# Patient Record
Sex: Female | Born: 1995 | Race: White | Hispanic: No | Marital: Married | Smoking: Never smoker
Health system: Southern US, Community
[De-identification: ages and names within clinical notes are randomized; demographics above are authoritative.]

## PROBLEM LIST (undated history)

## (undated) DIAGNOSIS — K219 Gastro-esophageal reflux disease without esophagitis: Secondary | ICD-10-CM

## (undated) DIAGNOSIS — R519 Headache, unspecified: Secondary | ICD-10-CM

## (undated) DIAGNOSIS — E079 Disorder of thyroid, unspecified: Secondary | ICD-10-CM

## (undated) DIAGNOSIS — F32A Depression, unspecified: Secondary | ICD-10-CM

## (undated) DIAGNOSIS — O905 Postpartum thyroiditis: Secondary | ICD-10-CM

## (undated) DIAGNOSIS — R87629 Unspecified abnormal cytological findings in specimens from vagina: Secondary | ICD-10-CM

## (undated) DIAGNOSIS — Z3490 Encounter for supervision of normal pregnancy, unspecified, unspecified trimester: Secondary | ICD-10-CM

## (undated) DIAGNOSIS — F419 Anxiety disorder, unspecified: Secondary | ICD-10-CM

## (undated) HISTORY — DX: Disorder of thyroid, unspecified: E07.9

## (undated) HISTORY — DX: Headache, unspecified: R51.9

## (undated) HISTORY — DX: Gastro-esophageal reflux disease without esophagitis: K21.9

## (undated) HISTORY — DX: Postpartum thyroiditis: O90.5

## (undated) HISTORY — DX: Anxiety disorder, unspecified: F41.9

## (undated) HISTORY — PX: DENTAL SURGERY: SHX609

## (undated) HISTORY — DX: Unspecified abnormal cytological findings in specimens from vagina: R87.629

## (undated) HISTORY — DX: Depression, unspecified: F32.A

---
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
## 2018-01-22 ENCOUNTER — Encounter: Payer: Self-pay | Admitting: Advanced Practice Midwife

## 2018-01-22 ENCOUNTER — Ambulatory Visit (INDEPENDENT_AMBULATORY_CARE_PROVIDER_SITE_OTHER): Payer: BLUE CROSS/BLUE SHIELD | Admitting: Advanced Practice Midwife

## 2018-01-22 ENCOUNTER — Other Ambulatory Visit (HOSPITAL_COMMUNITY)
Admission: RE | Admit: 2018-01-22 | Discharge: 2018-01-22 | Disposition: A | Discharge status: Home / Self Care | Payer: BLUE CROSS/BLUE SHIELD | Source: Ambulatory Visit | Attending: Advanced Practice Midwife | Admitting: Advanced Practice Midwife

## 2018-01-22 VITALS — BP 104/58 | HR 91 | Ht 67.0 in | Wt 154.0 lb

## 2018-01-22 DIAGNOSIS — Z113 Encounter for screening for infections with a predominantly sexual mode of transmission: Secondary | ICD-10-CM

## 2018-01-22 DIAGNOSIS — Z3400 Encounter for supervision of normal first pregnancy, unspecified trimester: Secondary | ICD-10-CM

## 2018-01-22 DIAGNOSIS — Z3201 Encounter for pregnancy test, result positive: Secondary | ICD-10-CM | POA: Diagnosis not present

## 2018-01-22 DIAGNOSIS — N912 Amenorrhea, unspecified: Secondary | ICD-10-CM

## 2018-01-22 DIAGNOSIS — Z3A01 Less than 8 weeks gestation of pregnancy: Secondary | ICD-10-CM

## 2018-01-22 DIAGNOSIS — Z3401 Encounter for supervision of normal first pregnancy, first trimester: Secondary | ICD-10-CM

## 2018-01-22 LAB — POCT URINE PREGNANCY: Preg Test, Ur: POSITIVE — AB

## 2018-01-22 NOTE — Progress Notes (Signed)
NOB 

## 2018-01-22 NOTE — Progress Notes (Signed)
New Obstetric Patient H&P    Chief Complaint: "Desires prenatal care"   History of Present Illness: Patient is a 22 y.o. G1P0000 Not Hispanic or Latino female, presents with amenorrhea and positive home pregnancy test. Patient's last menstrual period was 11/28/2017 (exact date). and based on her  LMP, her EDD is Estimated Date of Delivery: 09/04/18 and her EGA is [id number]. Cycles are 6. days, irregular, and occur approximately every : 1-2 months. Her last pap smear was less than a year ago and was ASCUS with NEGATIVE high risk HPV.    She had a urine pregnancy test which was positive 1 week(s)  ago. Her last menstrual period was normal and lasted for  5 or 6 day(s). Since her LMP she claims she has experienced breast tenderness, fatigue, nausea, vomiting. She denies vaginal bleeding. Her past medical history is noncontributory. This is her first pregnancy.  Since her LMP, she admits to the use of tobacco products  no She claims she has lost 2 pounds since the start of her pregnancy.  There are cats in the home in the home  yes If yes Indoor She admits close contact with children on a regular basis  no She has had chicken pox in the past no She has had Tuberculosis exposures, symptoms, or previously tested positive for TB   no Current or past history of domestic violence. no  Genetic Screening/Teratology Counseling: (Includes patient, baby's father, or anyone in either family with:)   1. [facility] 9635 [facility]  no 2. Thalassemia ([place], [place], Mediterranean, or Asian background): MCV<80  no 3. Neural tube defect (meningomyelocele, spina bifida, anencephaly)  no 4. Congenital heart defect  no  5. Down syndrome  no 6. Tay-Sachs (Jewish, [place])  no 7. Canavan's Disease  no 8. Sickle cell disease or trait (African)  no  9. Hemophilia or other blood disorders  no  10. Muscular dystrophy  no  11. Cystic fibrosis  no  12. Huntington's Chorea  no  13. Mental retardation/autism   no 14. Other inherited genetic or chromosomal disorder  no 15. Maternal metabolic disorder (DM, PKU, etc)  no 16. Patient or FOB with a child with a birth defect not listed above no  16a. Patient or FOB with a birth defect themselves no 17. Recurrent pregnancy loss, or stillbirth  no  18. Any medications since LMP other than prenatal vitamins (include vitamins, supplements, OTC meds, drugs, alcohol)  no 19. Any other genetic/environmental exposure to discuss  no  Infection History:   1. Lives with someone with TB or TB exposed  no  2. Patient or partner has history of genital herpes  no 3. Rash or viral illness since LMP  no 4. History of STI (GC, CT, HPV, syphilis, HIV)  no 5. History of recent travel :  no  Other pertinent information:  no     Review of Systems:10 point review of systems negative unless otherwise noted in HPI  Past Medical History:  History reviewed. No pertinent past medical history.  Past Surgical History:  History reviewed. No pertinent surgical history.  Gynecologic History: Patient's last menstrual period was 11/28/2017 (exact date).  Obstetric History: G1P0000  Family History:  History reviewed. No pertinent family history.  Social History:  Social History   Socioeconomic History  . Marital status: Single    Spouse name: Not on file  . Number of children: Not on file  . Years of education: Not on file  .  Highest education level: Not on file  Occupational History  . Not on file  Social Needs  . Financial resource strain: Not on file  . Food insecurity:    Worry: Not on file    Inability: Not on file  . Transportation needs:    Medical: Not on file    Non-medical: Not on file  Tobacco Use  . Smoking status: Never Smoker  . Smokeless tobacco: Never Used  Substance and Sexual Activity  . Alcohol use: Yes    Comment: Occ  . Drug use: Never  . Sexual activity: Yes    Birth control/protection: None  Lifestyle  . Physical activity:     Days per week: Not on file    Minutes per session: Not on file  . Stress: Not on file  Relationships  . Social connections:    Talks on phone: Not on file    Gets together: Not on file    Attends religious service: Not on file    Active member of club or organization: Not on file    Attends meetings of clubs or organizations: Not on file    Relationship status: Not on file  . Intimate partner violence:    Fear of current or ex partner: Not on file    Emotionally abused: Not on file    Physically abused: Not on file    Forced sexual activity: Not on file  Other Topics Concern  . Not on file  Social History Narrative  . Not on file    Allergies:  Allergies  Allergen Reactions  . Nexium [Esomeprazole Magnesium] Nausea And Vomiting    Medications: Prior to Admission medications   Not on File    Physical Exam Vitals: Blood pressure (!) 104/58, pulse 91, height 5\' 4"  (1.626 m), weight 154 lb (69.9 kg), last menstrual period 11/28/2017.  General: NAD HEENT: normocephalic, anicteric Thyroid: no enlargement, no palpable nodules Pulmonary: No increased work of breathing, CTAB Cardiovascular: RRR, distal pulses 2+ Abdomen: NABS, soft, non-tender, non-distended.  Umbilicus without lesions.  No hepatomegaly, splenomegaly or masses palpable. No evidence of hernia  Genitourinary: deferred for recent PAP smear and no concerns Extremities: no edema, erythema, or tenderness Neurologic: Grossly intact Psychiatric: mood appropriate, affect full   Assessment: 22 y.o. G1P0000 at [redacted]w[redacted]d presenting to initiate prenatal care  Plan: 1) Avoid alcoholic beverages. 2) Patient encouraged not to smoke.  3) Discontinue the use of all non-medicinal drugs and chemicals.  4) Take prenatal vitamins daily.  5) Nutrition, food safety (fish, cheese advisories, and high nitrite foods) and exercise discussed. 6) Hospital and practice style discussed with cross coverage system.  7) Genetic Screening,  such as with 1st Trimester Screening, cell free fetal DNA, AFP testing, and Ultrasound, as well as with amniocentesis and CVS as appropriate, is discussed with patient. At the conclusion of today's visit patient requested genetic testing 8) Patient is asked about travel to areas at risk for the Zika virus, and counseled to avoid travel and exposure to mosquitoes or sexual partners who may have themselves been exposed to the virus. Testing is discussed, and will be ordered as appropriate.    Tresea Mall, CNM [facility], [facility] 01/22/2018, 10:58 AM

## 2018-01-22 NOTE — Patient Instructions (Signed)
Exercise During Pregnancy For people of all ages, exercise is an important part of being healthy. Exercise improves heart and lung function and helps to maintain strength, flexibility, and a healthy body weight. Exercise also boosts energy levels and elevates mood. For most women, maintaining an exercise routine throughout pregnancy is recommended. It is only on rare occasions and with certain medical conditions or pregnancy complications that women may be asked to limit or avoid exercise during pregnancy. What are some other benefits to exercising during pregnancy? Along with maintaining strength and flexibility, exercising throughout pregnancy can help to:  Keep strength in muscles that are very important during labor and childbirth.  Decrease low back pain during pregnancy.  Decrease the risk of developing gestational diabetes mellitus (GDM).  Improve blood sugar (glucose) control for women who have GDM.  Decrease the risk of developing preeclampsia. This is a serious condition that causes high blood pressure along with other symptoms, such as swelling and headaches.  Decrease the risk of cesarean delivery.  Speed up the recovery after giving birth.  How often should I exercise? Unless your health care provider gives you different instructions, you should try to exercise on most days or all days of the week. In general, try to exercise with moderate intensity for about 150 minutes per week. This can be spread out across several days, such as exercising for 30 minutes per day on 5 days of each week. You can tell that you are exercising at a moderate intensity if you have a higher heart rate and faster breathing, but you are still able to hold a conversation. What types of moderate-intensity exercise are recommended during pregnancy? There are many types of exercise that are safe for you to do during pregnancy. Unless your health care provider gives you different instructions, do a variety of  exercises that safely increase your heart and breathing (cardiopulmonary) rates and help you to build and maintain muscle strength (strength training). You should always be able to talk in full sentences while exercising during pregnancy. Some examples of exercising that is safe to do during pregnancy include:  Brisk walking or hiking.  Swimming.  Water aerobics.  Riding a stationary bike.  Strength training.  Modified yoga or Pilates. Tell your instructor that you are pregnant. Avoid overstretching and avoid lying on your back for long periods of time.  Running or jogging. Only choose this type of exercise if: ? You ran or jogged regularly before your pregnancy. ? You can run or jog and still talk in complete sentences.  What types of exercise should I not do during pregnancy? Depending on your level of fitness and whether you exercised regularly before your pregnancy, you may be advised to limit vigorous-intensity exercise during your pregnancy. You can tell that you are exercising at a vigorous intensity if you are breathing much harder and faster and cannot hold a conversation while exercising. Some examples of exercising that you should avoid during pregnancy include:  Contact sports.  Activities that place you at risk for falling on or being hit in the belly, such as downhill skiing, water skiing, surfing, rock climbing, cycling, gymnastics, and horseback riding.  Scuba diving.  Sky diving.  Yoga or Pilates in a room that is heated to extreme temperatures ("hot yoga" or "hot Pilates").  Jogging or running, unless you ran or jogged regularly before your pregnancy. While jogging or running, you should always be able to talk in full sentences. Do not run or jog so vigorously   that you are unable to have a conversation.  If you are not used to exercising at elevation (more than 6,000 feet above sea level), do not do so during your pregnancy.  When should I avoid exercising  during pregnancy? Certain medical conditions can make it unsafe to exercise during pregnancy, or they may increase your risk of miscarriage or early labor and birth. Some of these conditions include:  Some types of heart disease.  Some types of lung disease.  Placenta previa. This is when the placenta partially or completely covers the opening of the uterus (cervix).  Frequent bleeding from the vagina during your pregnancy.  Incompetent cervix. This is when your cervix does not remain as tightly closed during pregnancy as it should.  Premature labor.  Ruptured membranes. This is when the protective sac (amniotic sac) opens up and amniotic fluid leaks from your vagina.  Severely low blood count (anemia).  Preeclampsia or pregnancy-caused high blood pressure.  Carrying more than one baby (multiple gestation) and having an additional risk of early labor.  Poorly controlled diabetes.  Being severely underweight or severely overweight.  Intrauterine growth restriction. This is when your baby's growth and development during pregnancy are slower than expected.  Other medical conditions. Ask your health care provider if any apply to you.  What else should I know about exercising during pregnancy? You should take these precautions while exercising during pregnancy:  Avoid overheating. ? Wear loose-fitting, breathable clothes. ? Do not exercise in very high temperatures.  Avoid dehydration. Drink enough water before, during, and after exercise to keep your urine clear or pale yellow.  Avoid overstretching. Because of hormone changes during pregnancy, it is easy to overstretch muscles, tendons, and ligaments during pregnancy.  Start slowly and ask your health care provider to recommend types of exercise that are safe for you, if exercising regularly is new for you.  Pregnancy is not a time for exercising to lose weight. When should I seek medical care? You should stop exercising  and call your health care provider if you have any unusual symptoms, such as:  Mild uterine contractions or abdominal cramping.  Dizziness that does not improve with rest.  When should I seek immediate medical care? You should stop exercising and call your local emergency services (911 in the [place] if you have any unusual symptoms, such as:  Sudden, severe pain in your low back or your belly.  Uterine contractions or abdominal cramping that do not improve with rest.  Chest pain.  Bleeding or fluid leaking from your vagina.  Shortness of breath.  This information is not intended to replace advice given to you by your health care provider. Make sure you discuss any questions you have with your health care provider. Document Released: 07/11/2005 Document Revised: 12/09/2015 Document Reviewed: 09/18/2014 Elsevier Interactive Patient Education  2018 [employer] Eating Plan for Pregnant Women While you are pregnant, your body will require additional nutrition to help support your growing baby. It is recommended that you consume:  150 additional calories each day during your first trimester.  300 additional calories each day during your second trimester.  300 additional calories each day during your third trimester.  Eating a healthy, well-balanced diet is very important for your health and for your baby's health. You also have a higher need for some vitamins and minerals, such as folic acid, calcium, iron, and vitamin D. What do I need to know about eating during pregnancy?  Do not try to lose weight   or go on a diet during pregnancy.  Choose healthy, nutritious foods. Choose  of a sandwich with a glass of milk instead of a candy bar or a high-calorie sugar-sweetened beverage.  Limit your overall intake of foods that have "empty calories." These are foods that have little nutritional value, such as sweets, desserts, candies, sugar-sweetened beverages, and fried foods.  Eat a  variety of foods, especially fruits and vegetables.  Take a prenatal vitamin to help meet the additional needs during pregnancy, specifically for folic acid, iron, calcium, and vitamin D.  Remember to stay active. Ask your health care provider for exercise recommendations that are specific to you.  Practice good food safety and cleanliness, such as washing your hands before you eat and after you prepare raw meat. This helps to prevent foodborne illnesses, such as listeriosis, that can be very dangerous for your baby. Ask your health care provider for more information about listeriosis. What does 150 extra calories look like? Healthy options for an additional 150 calories each day could be any of the following:  Plain low-fat yogurt (6-8 oz) with  cup of berries.  1 apple with 2 teaspoons of peanut butter.  Cut-up vegetables with  cup of hummus.  Low-fat chocolate milk (8 oz or 1 cup).  1 string cheese with 1 medium orange.   of a peanut butter and jelly sandwich on whole-wheat bread (1 tsp of peanut butter).  For 300 calories, you could eat two of those healthy options each day. What is a healthy amount of weight to gain? The recommended amount of weight for you to gain is based on your pre-pregnancy BMI. If your pre-pregnancy BMI was:  Less than 18 (underweight), you should gain 28-40 lb.  18-24.9 (normal), you should gain 25-35 lb.  25-29.9 (overweight), you should gain 15-25 lb.  Greater than 30 (obese), you should gain 11-20 lb.  What if I am having twins or multiples? Generally, pregnant women who will be having twins or multiples may need to increase their daily calories by 300-600 calories each day. The recommended range for total weight gain is 25-54 lb, depending on your pre-pregnancy BMI. Talk with your health care provider for specific guidance about additional nutritional needs, weight gain, and exercise during your pregnancy. What foods can I eat? Grains Any  grains. Try to choose whole grains, such as whole-wheat bread, oatmeal, or brown rice. Vegetables Any vegetables. Try to eat a variety of colors and types of vegetables to get a full range of vitamins and minerals. Remember to wash your vegetables well before eating. Fruits Any fruits. Try to eat a variety of colors and types of fruit to get a full range of vitamins and minerals. Remember to wash your fruits well before eating. Meats and Other Protein Sources Lean meats, including chicken, turkey, fish, and lean cuts of beef, veal, or pork. Make sure that all meats are cooked to "well done." Tofu. Tempeh. Beans. Eggs. Peanut butter and other nut butters. Seafood, such as shrimp, crab, and lobster. If you choose fish, select types that are higher in omega-3 fatty acids, including salmon, herring, mussels, trout, sardines, and pollock. Make sure that all meats are cooked to food-safe temperatures. Dairy Pasteurized milk and milk alternatives. Pasteurized yogurt and pasteurized cheese. Cottage cheese. Sour cream. Beverages Water. Juices that contain 100% fruit juice or vegetable juice. Caffeine-free teas and decaffeinated coffee. Drinks that contain caffeine are okay to drink, but it is better to avoid caffeine. Keep your total caffeine   intake to less than 200 mg each day (12 oz of coffee, tea, or soda) or as directed by your health care provider. Condiments Any pasteurized condiments. Sweets and Desserts Any sweets and desserts. Fats and Oils Any fats and oils. The items listed above may not be a complete list of recommended foods or beverages. Contact your dietitian for more options. What foods are not recommended? Vegetables Unpasteurized (raw) vegetable juices. Fruits Unpasteurized (raw) fruit juices. Meats and Other Protein Sources Cured meats that have nitrates, such as bacon, salami, and hotdogs. Luncheon meats, bologna, or other deli meats (unless they are reheated until they are  steaming hot). Refrigerated pate, meat spreads from a meat counter, smoked seafood that is found in the refrigerated section of a store. Raw fish, such as sushi or sashimi. High mercury content fish, such as tilefish, shark, swordfish, and king mackerel. Raw meats, such as tuna or beef tartare. Undercooked meats and poultry. Make sure that all meats are cooked to food-safe temperatures. Dairy Unpasteurized (raw) milk and any foods that have raw milk in them. Soft cheeses, such as feta, queso blanco, queso fresco, Brie, Camembert cheeses, blue-veined cheeses, and Panela cheese (unless it is made with pasteurized milk, which must be stated on the label). Beverages Alcohol. Sugar-sweetened beverages, such as sodas, teas, or energy drinks. Condiments Homemade fermented foods and drinks, such as pickles, sauerkraut, or kombucha drinks. (Store-bought pasteurized versions of these are okay.) Other Salads that are made in the store, such as ham salad, chicken salad, egg salad, tuna salad, and seafood salad. The items listed above may not be a complete list of foods and beverages to avoid. Contact your dietitian for more information. This information is not intended to replace advice given to you by your health care provider. Make sure you discuss any questions you have with your health care provider. Document Released: 04/25/2014 Document Revised: 12/17/2015 Document Reviewed: 12/24/2013 Elsevier Interactive Patient Education  2018 [employer] Prenatal Care WHAT IS PRENATAL CARE? Prenatal care is the process of caring for a pregnant woman before she gives birth. Prenatal care makes sure that she and her baby remain as healthy as possible throughout pregnancy. Prenatal care may be provided by a midwife, family practice health care provider, or a childbirth and pregnancy specialist (obstetrician). Prenatal care may include physical examinations, testing, treatments, and education on nutrition, lifestyle, and  social support services. WHY IS PRENATAL CARE SO IMPORTANT? Early and consistent prenatal care increases the chance that you and your baby will remain healthy throughout your pregnancy. This type of care also decreases a baby's risk of being born too early (prematurely), or being born smaller than expected (small for gestational age). Any underlying medical conditions you may have that could pose a risk during your pregnancy are discussed during prenatal care visits. You will also be monitored regularly for any new conditions that may arise during your pregnancy so they can be treated quickly and effectively. WHAT HAPPENS DURING PRENATAL CARE VISITS? Prenatal care visits may include the following: Discussion Tell your health care provider about any new signs or symptoms you have experienced since your last visit. These might include:  Nausea or vomiting.  Increased or decreased level of energy.  Difficulty sleeping.  Back or leg pain.  Weight changes.  Frequent urination.  Shortness of breath with physical activity.  Changes in your skin, such as the development of a rash or itchiness.  Vaginal discharge or bleeding.  Feelings of excitement or nervousness.  Changes in   your baby's movements.  You may want to write down any questions or topics you want to discuss with your health care provider and bring them with you to your appointment. Examination During your first prenatal care visit, you will likely have a complete physical exam. Your health care provider will often examine your vagina, cervix, and the position of your uterus, as well as check your heart, lungs, and other body systems. As your pregnancy progresses, your health care provider will measure the size of your uterus and your baby's position inside your uterus. He or she may also examine you for early signs of labor. Your prenatal visits may also include checking your blood pressure and, after about 10-12 weeks of  pregnancy, listening to your baby's heartbeat. Testing Regular testing often includes:  Urinalysis. This checks your urine for glucose, protein, or signs of infection.  Blood count. This checks the levels of white and red blood cells in your body.  Tests for sexually transmitted infections (STIs). Testing for STIs at the beginning of pregnancy is routinely done and is required in many states.  Antibody testing. You will be checked to see if you are immune to certain illnesses, such as rubella, that can affect a developing fetus.  Glucose screen. Around 24-28 weeks of pregnancy, your blood glucose level will be checked for signs of gestational diabetes. Follow-up tests may be recommended.  Group B strep. This is a bacteria that is commonly found inside a woman's vagina. This test will inform your health care provider if you need an antibiotic to reduce the amount of this bacteria in your body prior to labor and childbirth.  Ultrasound. Many pregnant women undergo an ultrasound screening around 18-20 weeks of pregnancy to evaluate the health of the fetus and check for any developmental abnormalities.  HIV (human immunodeficiency virus) testing. Early in your pregnancy, you will be screened for HIV. If you are at high risk for HIV, this test may be repeated during your third trimester of pregnancy.  You may be offered other testing based on your age, personal or family medical history, or other factors. HOW OFTEN SHOULD I PLAN TO SEE MY HEALTH CARE PROVIDER FOR PRENATAL CARE? Your prenatal care check-up schedule depends on any medical conditions you have before, or develop during, your pregnancy. If you do not have any underlying medical conditions, you will likely be seen for checkups:  Monthly, during the first 6 months of pregnancy.  Twice a month during months 7 and 8 of pregnancy.  Weekly starting in the 9th month of pregnancy and until delivery.  If you develop signs of early labor  or other concerning signs or symptoms, you may need to see your health care provider more often. Ask your health care provider what prenatal care schedule is best for you. WHAT CAN I DO TO KEEP MYSELF AND MY BABY AS HEALTHY AS POSSIBLE DURING MY PREGNANCY?  Take a prenatal vitamin containing 400 micrograms (0.4 mg) of folic acid every day. Your health care provider may also ask you to take additional vitamins such as iodine, vitamin D, iron, copper, and zinc.  Take 1500-2000 mg of calcium daily starting at your 20th week of pregnancy until you deliver your baby.  Make sure you are up to date on your vaccinations. Unless directed otherwise by your health care provider: ? You should receive a tetanus, diphtheria, and pertussis (Tdap) vaccination between the 27th and 36th week of your pregnancy, regardless of when your last Tdap immunization   occurred. This helps protect your baby from whooping cough (pertussis) after he or she is born. ? You should receive an annual inactivated influenza vaccine (IIV) to help protect you and your baby from influenza. This can be done at any point during your pregnancy.  Eat a well-rounded diet that includes: ? Fresh fruits and vegetables. ? Lean proteins. ? Calcium-rich foods such as milk, yogurt, hard cheeses, and dark, leafy greens. ? Whole grain breads.  Do noteat seafood high in mercury, including: ? Swordfish. ? Tilefish. ? Shark. ? King mackerel. ? More than 6 oz tuna per week.  Do not eat: ? Raw or undercooked meats or eggs. ? Unpasteurized foods, such as soft cheeses (brie, blue, or feta), juices, and milks. ? Lunch meats. ? Hot dogs that have not been heated until they are steaming.  Drink enough water to keep your urine clear or pale yellow. For many women, this may be 10 or more 8 oz glasses of water each day. Keeping yourself hydrated helps deliver nutrients to your baby and may prevent the start of pre-term uterine contractions.  Do not use  any tobacco products including cigarettes, chewing tobacco, or electronic cigarettes. If you need help quitting, ask your health care provider.  Do not drink beverages containing alcohol. No safe level of alcohol consumption during pregnancy has been determined.  Do not use any illegal drugs. These can harm your developing baby or cause a miscarriage.  Ask your health care provider or pharmacist before taking any prescription or over-the-counter medicines, herbs, or supplements.  Limit your caffeine intake to no more than 200 mg per day.  Exercise. Unless told otherwise by your health care provider, try to get 30 minutes of moderate exercise most days of the week. Do not  do high-impact activities, contact sports, or activities with a high risk of falling, such as horseback riding or downhill skiing.  Get plenty of rest.  Avoid anything that raises your body temperature, such as hot tubs and saunas.  If you own a cat, do not empty its litter box. Bacteria contained in cat feces can cause an infection called toxoplasmosis. This can result in serious harm to the fetus.  Stay away from chemicals such as insecticides, lead, mercury, and cleaning or paint products that contain solvents.  Do not have any X-rays taken unless medically necessary.  Take a childbirth and breastfeeding preparation class. Ask your health care provider if you need a referral or recommendation.  This information is not intended to replace advice given to you by your health care provider. Make sure you discuss any questions you have with your health care provider. Document Released: 07/14/2003 Document Revised: 12/14/2015 Document Reviewed: 09/25/2013 Elsevier Interactive Patient Education  2017 [employer]  

## 2018-01-23 ENCOUNTER — Encounter (INDEPENDENT_AMBULATORY_CARE_PROVIDER_SITE_OTHER): Payer: Self-pay

## 2018-01-23 ENCOUNTER — Encounter: Payer: Self-pay | Admitting: Advanced Practice Midwife

## 2018-01-23 ENCOUNTER — Telehealth: Payer: Self-pay

## 2018-01-23 LAB — CERVICOVAGINAL ANCILLARY ONLY
Chlamydia: NEGATIVE
Neisseria Gonorrhea: NEGATIVE

## 2018-01-23 NOTE — Telephone Encounter (Signed)
Pt needs note faxed to her work saying she is on transitional duty? I called pt to get fax number no answer , Please advise about letter.

## 2018-01-23 NOTE — Telephone Encounter (Signed)
Fax is [phone]

## 2018-01-23 NOTE — Progress Notes (Signed)
Waiting to hear back from patient regarding specific job restrictions prior to writing work letter.

## 2018-01-24 ENCOUNTER — Encounter: Payer: Self-pay | Admitting: Advanced Practice Midwife

## 2018-01-24 LAB — URINE CULTURE: Organism ID, Bacteria: NO GROWTH

## 2018-01-24 LAB — RPR+RH+ABO+RUB AB+AB SCR+CB...
Antibody Screen: NEGATIVE
HIV Screen 4th Generation wRfx: NONREACTIVE
Hematocrit: 39.5 % (ref 34.0–46.6)
Hemoglobin: 13 g/dL (ref 11.1–15.9)
Hepatitis B Surface Ag: NEGATIVE
MCH: 31.6 pg (ref 26.6–33.0)
MCHC: 32.9 g/dL (ref 31.5–35.7)
MCV: 96 fL (ref 79–97)
Platelets: 282 10*3/uL (ref 150–450)
RBC: 4.11 x10E6/uL (ref 3.77–5.28)
RDW: 11.5 % — ABNORMAL LOW (ref 12.3–15.4)
RPR Ser Ql: NONREACTIVE
Rh Factor: POSITIVE
Rubella Antibodies, IGG: 3.17 index (ref >0.99)
Varicella zoster IgG: 850 index (ref >165)
WBC: 8 10*3/uL (ref 3.4–10.8)

## 2018-01-24 LAB — URINE DRUG PANEL 7
Amphetamines, Urine: NEGATIVE ng/mL
Barbiturate Quant, Ur: NEGATIVE ng/mL
Benzodiazepine Quant, Ur: NEGATIVE ng/mL
Cannabinoid Quant, Ur: NEGATIVE ng/mL
Cocaine (Metab.): NEGATIVE ng/mL
Opiate Quant, Ur: NEGATIVE ng/mL
PCP Quant, Ur: NEGATIVE ng/mL

## 2018-01-24 NOTE — Telephone Encounter (Signed)
Shanda BumpsJessica, if you are able to locate the work letter I wrote in March[facility]'s chart can you please fax it to the number we have. If you aren't able to locate it I can get it on Friday when I am back in the office.  Thanks, Erskine SquibbJane

## 2018-01-24 NOTE — Progress Notes (Signed)
Work restriction letter written. Will fax when I return to office on Friday of this week.

## 2018-01-24 NOTE — Telephone Encounter (Signed)
Letter written. Will fax on Friday when I return to office.

## 2018-01-24 NOTE — Telephone Encounter (Signed)
done

## 2018-01-29 ENCOUNTER — Ambulatory Visit (INDEPENDENT_AMBULATORY_CARE_PROVIDER_SITE_OTHER): Payer: BLUE CROSS/BLUE SHIELD | Admitting: Obstetrics and Gynecology

## 2018-01-29 ENCOUNTER — Ambulatory Visit (INDEPENDENT_AMBULATORY_CARE_PROVIDER_SITE_OTHER): Payer: BLUE CROSS/BLUE SHIELD

## 2018-01-29 VITALS — BP 108/56 | Wt 154.0 lb

## 2018-01-29 DIAGNOSIS — O3680X0 Pregnancy with inconclusive fetal viability, not applicable or unspecified: Secondary | ICD-10-CM | POA: Diagnosis not present

## 2018-01-29 DIAGNOSIS — Z3400 Encounter for supervision of normal first pregnancy, unspecified trimester: Secondary | ICD-10-CM

## 2018-01-29 DIAGNOSIS — Z3A08 8 weeks gestation of pregnancy: Secondary | ICD-10-CM | POA: Diagnosis not present

## 2018-01-29 NOTE — Patient Instructions (Signed)
This is a very exciting time for you and your family, so congratulations from everybody here at [facility]! You have just embarked on a very amazing journey. The next several months will be filled with wondrous emotions and miraculous memories. As you begin your preparations, this office wanted you to be aware of a few prenatal genetic laboratory tests that are available to you early in your pregnancy. These tests are optional and you may decide to opt out of the testing.   Patients often voice their desire to opt out of this testing as it would not change their decisions on continuing the pregnancy.  However, our providers value the information they receive from these tests as they allow us to optimize the care for you and your baby prior to delivery.    There are 6 genetic laboratory tests this office offers, and they test for a variety of different genetic diseases or chromosomal abnormalities. By utilizing these tests, the providers can better understand your risk associated with passing on a genetic condition to your child. These tests are screening tests, and are not used to diagnose any condition. If one of these tests results abnormal, then a diagnostic test will be offered to you. It is important to remember that most pregnancies will result in a beautiful and healthy baby. Knowing the results of these tests will also help you better prepare for your delivery.  We encourage you to read over the brief descriptions below and engage with your provider regarding any additional questions you may have.  Please also make your provider aware if you or your partner has any Jewish ancestry as there may be additional testing that you qualify for. The tests that will be ordered are: 1. Cystic Fibrosis Carrier Screen1 (Blood Test): The most common autosomal recessive disorder. This disease causes thick mucus to build up in the lungs, which leads to repetitive chest infections. The carrier frequency in caucasians is  roughly 1 in 30 people in the [place], but varies by ethnicity.   2. Spinal Muscular Atrophy Carrier Screen1 (Blood Test):  The second most common autosomal recessive disorder and the most common inherited form of early childhood death. This is degenerative neuromuscular condition that affects the child's ability to sit, smile, breath, swallow, etc. The carrier frequency is the [place] is roughly 1 in 54, but varies by ethnicity. 3. Fragile X Syndrome Carrier Screen1 (Blood Test): The most common form of inherited mental retardation. The severity of the retardation varies. Fragile X is most commonly passed from mother to child, and 1 in 260 people in the [place] are carriers. 4. Downs Syndrome, Trisomy 18: Trisomy 21 ( Downs Syndrome) and Trisomy 18 are common forms of mental retardation due to chromosomal abnormalities. Downs is the milder of the two, and it occurs 1 in 700 live births. Trisomy 18 is a more severe form of mental retardation, and occurs in 1 in 6000 live births. The risk of conceiving a baby with one of these two genetic conditions is independent of family history.  The test offered to you will depend on how far along you are in your pregnancy.  The main risk factor that has been identified as predisposing to either trisomy 21 or trisomy 18 is the age of the mother, with increased risk for those patients over the age of 35 at the time of delivery.  If you are deemed high risk on the initial screening test or are over 35 you may be a   candidate for cell free fetal DNA testing or amniocentesis outlined below.  See #6, 7, 8, and 9 for available testing options 5.  MSAFP2 Maternal Serum Alpha Feto-Protein (Blood Test):  Open Neural Tube Defects deal with an opening in the spinal column that does not close properly during pregnancy. It occurs when the tissues that fold to form the neural tube do not close or do not stay closed completely. This causes an opening in the vertebrae,  which surround and protect the spinal cord. The most common form is Spina Bifida. It occurs in 1 in 1000 live births. The folic acid in prenatal vitamins can decrease the risk of you baby developing this birth defect.  If you have had a prior pregnancy complicated by spina bifida you may need higher doses of folic acid.   6. 1st trimester screening2 (Blood Test):  this test is a combination of an ultrasound measurement of your baby's neck and blood work ideally obtained between 11 weeks and 13 weeks 6 days gestation.  It tests for #4 above 7. Tetra Screen2 (Blood Test):  this is a combined blood test offered at 15 to [redacted] weeks gestation.  It tests for conditions #4 and #5 above 8. Cell Free Fetal DNA2 (Blood Test):  this test is available for our patient over 35 or patient who were found to be at increased risk for down syndrome or trisomy 18 based on either 1st trimester screening results or serum tetra screening.  At present this testing is still considered screening rather than diagnostic 9. Amniocentesis - this test is available for our patient over 35 or patient who were found to be at increased risk for down syndrome or trisomy 18 based on either 1st trimester screening results or serum tetra screening.  This test involves using a needle to draw off some of the fluid from around the baby in order to determine if the fetus is affected by either trisomy 21 (Downs Syndrome) or trisomy 18.  In addition this test may be suggested or offered by your provider in other circumstances.  This test is considered diagnostic as opposed to screening meaning that it can definitively rule in or rule out the tested condition.  Unlike the other testing discussed amniocentesis is an invasive procedure and is associated with a small risk (approximately 1 in 200) of resulting in a miscarriage. 1. It is also important to notate that if you screen positive for carrier status for Cystic Fibrosis or Spinal Muscular Atrophy, that  does not mean your child will be affected with the condition. The child's father will also need to be tested. If both of you are carriers, then there is a 25% that you will have an affected child. The risk of having a child affected with Downs Syndrome or Trisomy 18 varies by maternal age. There is not a "carrier" status for these conditions. If you would like more information of these conditions, please see the handouts in your packet of information. 2. Denotes screening tests.   These tests can assess the risk of the pregnancy being affected by a particular condition.  It is important to note that even if testing deems that you are at increased risk your baby may still be unaffected.  Conversely, test results indicating that your baby is at low risk for the tested condition does not rule out that the baby could still be affected by that condition  

## 2018-01-29 NOTE — Progress Notes (Signed)
Routine Prenatal Care Visit  Subjective  Jennifer Nelson is a 22 y.o. G1P0000 at [redacted]w[redacted]d being seen today for ongoing prenatal care.  She is currently monitored for the following issues for this low-risk pregnancy and has Supervision of normal first pregnancy, antepartum on their problem list.  ----------------------------------------------------------------------------------- Patient reports no complaints.    . Vag. Bleeding: None.  Movement: Absent. Denies leaking of fluid.  ----------------------------------------------------------------------------------- The following portions of the patient's history were reviewed and updated as appropriate: allergies, current medications, past family history, past medical history, past social history, past surgical history and problem list. Problem list updated.   Objective  Blood pressure (!) 108/56, weight 154 lb (69.9 kg), last menstrual period 11/28/2017. Pregravid weight 156 lb (70.8 kg) Total Weight Gain -2 lb (-0.907 kg)  Body mass index is 24.12 kg/m. Urinalysis: Urine Protein: Negative Urine Glucose: Negative  Fetal Status: Fetal Heart Rate (bpm): present   Movement: Absent     General:  Alert, oriented and cooperative. Patient is in no acute distress.  Skin: Skin is warm and dry. No rash noted.   Cardiovascular: Normal heart rate noted  Respiratory: Normal respiratory effort, no problems with respiration noted  Abdomen: Soft, gravid, appropriate for gestational age. Pain/Pressure: Absent     Pelvic:  Cervical exam deferred        Extremities: Normal range of motion.     ental Status: Normal mood and affect. Normal behavior. Normal judgment and thought content.   US Ob Comp Less 14 Wks  Result Date: 01/29/2018 ULTRASOUND REPORT Location: [facility] Date of Service: 01/29/2018 Patient Name: Jennifer Nelson DOB: Aug 17, 1995 MRN: [id number] Indications:dating Findings: Mason Jim intrauterine pregnancy is visualized with a CRL  consistent with [redacted]w[redacted]d gestation, giving an (U/S) EDD of 09/06/2018. The (U/S) EDD is consistent with the clinically established EDD of 09/04/2018. FHR: 167 BPM CRL measurement: 20.2 mm Yolk sac is visualized and appears normal and early anatomy is normal. Amnion: is visualized and appears normal. Right Ovary is normal in appearance. Left Ovary is normal appearance. Corpus luteal cyst:  is not visualized Survey of the adnexa demonstrates no adnexal masses. There is no free peritoneal fluid in the cul de sac. Impression: 1. [redacted]w[redacted]d Viable Singleton Intrauterine pregnancy by U/S. 2. (U/S) EDD is consistent with Clinically established EDD of 09/04/2018. Recommendations: 1.Clinical correlation with the patient's History and Physical Exam. Jennifer Nelson, RDMS There is a viable singleton gestation.  The fetal biometry correlates with established dating. Detailed evaluation of the fetal anatomy is precluded by early gestational age.  It must be noted that a normal ultrasound particular at this early gestational age is unable to rule out fetal aneuploidy, risk of first trimester miscarriage, or anatomic birth defects. Jennifer Austria, MD, Evern Core [facility], [facility] 01/29/2018, 11:15 AM     Assessment   22 y.o. G1P0000 at [redacted]w[redacted]d by  09/04/2018, by Last Menstrual Period presenting for routine prenatal visit  Plan   Pregnancy#1 Problems (from 11/28/17 to present)    Problem Noted Resolved   Supervision of normal first pregnancy, antepartum 01/22/2018 by Jennifer Nelson, CNM No   Overview Addendum 01/29/2018 11:37 AM by Jennifer Austria, MD    Clinic [facility] Prenatal Labs  Dating LMP = [redacted]w[redacted]d [place] Blood type: O/Positive/-- (07/01 1053)   Genetic Screen Inheritest NIPS: Antibody:Negative (07/01 1053)  Anatomic [place]  Rubella: 3.17 (07/01 1053) Varicella: Immune  GTT  RPR: Non Reactive (07/01 1053)   Rhogam N/A HBsAg: Negative (  07/01 1053)   TDaP vaccine [ ]  30 weeks    Flu Shot: HIV: Non Reactive  (07/01 1053)   Baby Food                                GBS:   Contraception  Pap: 07/03/2017 [facility]) [ ]  repeat postpartum  CBB     CS/VBAC NA   Support Person Husband Jennifer Nelson               Gestational age appropriate obstetric precautions including but not limited to vaginal bleeding, contractions, leaking of fluid and fetal movement were reviewed in detail with the patient.  - Given handout on inheritest and maternity 21, will decide next visit - S=D on scan - nausea improving   Return in about 3 weeks (around 02/19/2018) for ROB and genetic testing.  Jennifer AustriaAndreas Liyana Suniga, MD, Evern CoreFACOG [facility], [facility] 01/29/2018, 11:38 AM

## 2018-01-29 NOTE — Progress Notes (Signed)
ROB  Dating scan today 

## 2018-02-19 ENCOUNTER — Ambulatory Visit (INDEPENDENT_AMBULATORY_CARE_PROVIDER_SITE_OTHER): Payer: BLUE CROSS/BLUE SHIELD | Admitting: Certified Nurse Midwife

## 2018-02-19 VITALS — BP 102/62 | Wt 154.0 lb

## 2018-02-19 DIAGNOSIS — Z3A11 11 weeks gestation of pregnancy: Secondary | ICD-10-CM

## 2018-02-19 DIAGNOSIS — Z1379 Encounter for other screening for genetic and chromosomal anomalies: Secondary | ICD-10-CM

## 2018-02-19 DIAGNOSIS — Z3400 Encounter for supervision of normal first pregnancy, unspecified trimester: Secondary | ICD-10-CM

## 2018-02-19 DIAGNOSIS — O9989 Other specified diseases and conditions complicating pregnancy, childbirth and the puerperium: Secondary | ICD-10-CM

## 2018-02-19 DIAGNOSIS — R110 Nausea: Secondary | ICD-10-CM

## 2018-02-19 NOTE — Progress Notes (Signed)
ROB at 11wk6day: Having persistent nausea. Vomiting maybe every 2-3 days. Would like more Bonjesta samples. After discussion regarding options for genetic testing, would like to do cell free DNA testing. Declines NT Given samples of Bonjesta. Will order RX through [facility] if desires prescription Materniti 21 done today Will call with results ROB in 4 weeks Farrel Connersolleen Jakell Trusty, CNM

## 2018-02-19 NOTE — Progress Notes (Signed)
ROB C/o n/v, not sleeping to good

## 2018-02-23 LAB — MATERNIT 21 PLUS CORE, BLOOD
Chromosome 13: NEGATIVE
Chromosome 18: NEGATIVE
Chromosome 21: NEGATIVE
Y Chromosome: DETECTED

## 2018-02-27 ENCOUNTER — Telehealth: Payer: Self-pay

## 2018-02-27 NOTE — Telephone Encounter (Signed)
Pt missed call Friday.  Thinks it was regarding test results.  [phone]

## 2018-03-12 ENCOUNTER — Other Ambulatory Visit: Payer: Self-pay | Admitting: Certified Nurse Midwife

## 2018-03-12 ENCOUNTER — Telehealth: Payer: Self-pay

## 2018-03-12 MED ORDER — DOXYLAMINE-PYRIDOXINE ER 20-20 MG PO TBCR: 1.0000 | EXTENDED_RELEASE_TABLET | Freq: Every day | ORAL | 2 refills | Status: DC

## 2018-03-12 NOTE — Telephone Encounter (Signed)
Pt calling triage stating colleen told her to give us a call if she would [facility] after trying the samples and she does. Please advise

## 2018-03-19 ENCOUNTER — Encounter: Payer: Self-pay | Admitting: Obstetrics and Gynecology

## 2018-03-19 ENCOUNTER — Ambulatory Visit (INDEPENDENT_AMBULATORY_CARE_PROVIDER_SITE_OTHER): Payer: BLUE CROSS/BLUE SHIELD | Admitting: Obstetrics and Gynecology

## 2018-03-19 VITALS — BP 110/64 | Wt 156.0 lb

## 2018-03-19 DIAGNOSIS — Z3402 Encounter for supervision of normal first pregnancy, second trimester: Secondary | ICD-10-CM

## 2018-03-19 DIAGNOSIS — Z23 Encounter for immunization: Secondary | ICD-10-CM

## 2018-03-19 DIAGNOSIS — Z3400 Encounter for supervision of normal first pregnancy, unspecified trimester: Secondary | ICD-10-CM

## 2018-03-19 DIAGNOSIS — Z3A15 15 weeks gestation of pregnancy: Secondary | ICD-10-CM

## 2018-03-19 LAB — POCT URINALYSIS DIPSTICK OB
Glucose, UA: NEGATIVE — AB
POC,PROTEIN,UA: NEGATIVE

## 2018-03-19 NOTE — Progress Notes (Signed)
ROB C/o some stomach pains a couple weeks ago but has gotten better

## 2018-03-19 NOTE — Progress Notes (Signed)
    Routine Prenatal Care Visit  Subjective  Jennifer Nelson is a 22 y.o. G1P0000 at 494w6d being seen today for ongoing prenatal care.  She is currently monitored for the following issues for this low-risk pregnancy and has Supervision of normal first pregnancy, antepartum on their problem list.  ----------------------------------------------------------------------------------- Patient reports no complaints and that nausea has improved with bonjesta. .    Lockie Pares. Vag. Bleeding: None.  Movement: Absent. Denies leaking of fluid.  ----------------------------------------------------------------------------------- The following portions of the patient's history were reviewed and updated as appropriate: allergies, current medications, past family history, past medical history, past social history, past surgical history and problem list. Problem list updated.   Objective  Blood pressure 110/64, weight 156 lb (70.8 kg), last menstrual period 11/28/2017. Pregravid weight 156 lb (70.8 kg) Total Weight Gain 0 lb (0 kg) Urinalysis:      Fetal Status: Fetal Heart Rate (bpm): 147   Movement: Absent     General:  Alert, oriented and cooperative. Patient is in no acute distress.  Skin: Skin is warm and dry. No rash noted.   Cardiovascular: Normal heart rate noted  Respiratory: Normal respiratory effort, no problems with respiration noted  Abdomen: Soft, gravid, appropriate for gestational age. Pain/Pressure: Absent     Pelvic:  Cervical exam deferred        Extremities: Normal range of motion.  Edema: None  ental Status: Normal mood and affect. Normal behavior. Normal judgment and thought content.     Assessment   22 y.o. 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 Antibody:Negative (07/01 1053)  Anatomic US  Rubella: 3.17 (07/01 1053) Varicella: Immune  GTT  RPR: Non Reactive (07/01 1053)   Rhogam N/A HBsAg: Negative (07/01 1053)   TDaP vaccine [ ]  30 weeks     Flu Shot:03/19/18 HIV: Non Reactive (07/01 1053)   Baby Food     bottle                           GBS:   Contraception Given information Pap: 07/03/2017 [facility] ([facility]) [ ]  repeat postpartum  CBB     CS/VBAC NA   Support Person Husband Aneta Minshillip               Gestational age appropriate obstetric precautions including but not limited to vaginal bleeding, contractions, leaking of fluid and fetal movement were reviewed in detail with the patient.    Given information on prenatal classes with [facility].  Given information on birthcontrol options postpartum. She is undecided about method at this time.  Discussed breast feeding. Patient is planning on bottle feeding.  Anatomy US at next visit Flu shot today.   Return in about 4 weeks (around 04/16/2018) for ROB and US.  Adelene Idlerhristanna Reeves Musick MD [facility],  [facility] 03/19/18 3:39 PM

## 2018-04-16 ENCOUNTER — Ambulatory Visit (INDEPENDENT_AMBULATORY_CARE_PROVIDER_SITE_OTHER): Payer: BLUE CROSS/BLUE SHIELD

## 2018-04-16 ENCOUNTER — Ambulatory Visit (INDEPENDENT_AMBULATORY_CARE_PROVIDER_SITE_OTHER): Payer: BLUE CROSS/BLUE SHIELD | Admitting: Obstetrics and Gynecology

## 2018-04-16 ENCOUNTER — Encounter: Payer: Self-pay | Admitting: Obstetrics and Gynecology

## 2018-04-16 VITALS — BP 112/70 | Wt 161.0 lb

## 2018-04-16 DIAGNOSIS — Z3400 Encounter for supervision of normal first pregnancy, unspecified trimester: Secondary | ICD-10-CM

## 2018-04-16 DIAGNOSIS — Z363 Encounter for antenatal screening for malformations: Secondary | ICD-10-CM | POA: Diagnosis not present

## 2018-04-16 DIAGNOSIS — Z3A19 19 weeks gestation of pregnancy: Secondary | ICD-10-CM

## 2018-04-16 DIAGNOSIS — Z3401 Encounter for supervision of normal first pregnancy, first trimester: Secondary | ICD-10-CM

## 2018-04-16 LAB — POCT URINALYSIS DIPSTICK OB
Glucose, UA: NEGATIVE
POC,PROTEIN,UA: NEGATIVE

## 2018-04-16 NOTE — Patient Instructions (Signed)
Second Trimester of Pregnancy The second trimester is from week 13 through week 28, month 4 through 6. This is often the time in pregnancy that you feel your best. Often times, morning sickness has lessened or quit. You may have more energy, and you may get hungry more often. Your unborn baby (fetus) is growing rapidly. At the end of the sixth month, he or she is about 9 inches long and weighs about 1 pounds. You will likely feel the baby move (quickening) between 18 and 20 weeks of pregnancy. Follow these instructions at home:  Avoid all smoking, herbs, and alcohol. Avoid drugs not approved by your doctor.  Do not use any tobacco products, including cigarettes, chewing tobacco, and electronic cigarettes. If you need help quitting, ask your doctor. You may get counseling or other support to help you quit.  Only take medicine as told by your doctor. Some medicines are safe and some are not during pregnancy.  Exercise only as told by your doctor. Stop exercising if you start having cramps.  Eat regular, healthy meals.  Wear a good support bra if your breasts are tender.  Do not use hot tubs, steam rooms, or saunas.  Wear your seat belt when driving.  Avoid raw meat, uncooked cheese, and liter boxes and soil used by cats.  Take your prenatal vitamins.  Take 1500-2000 milligrams of calcium daily starting at the 20th week of pregnancy until you deliver your baby.  Try taking medicine that helps you poop (stool softener) as needed, and if your doctor approves. Eat more fiber by eating fresh fruit, vegetables, and whole grains. Drink enough fluids to keep your pee (urine) clear or pale yellow.  Take warm water baths (sitz baths) to soothe pain or discomfort caused by hemorrhoids. Use hemorrhoid cream if your doctor approves.  If you have puffy, bulging veins (varicose veins), wear support hose. Raise (elevate) your feet for 15 minutes, 3-4 times a day. Limit salt in your diet.  Avoid heavy  lifting, wear low heals, and sit up straight.  Rest with your legs raised if you have leg cramps or low back pain.  Visit your dentist if you have not gone during your pregnancy. Use a soft toothbrush to brush your teeth. Be gentle when you floss.  You can have sex (intercourse) unless your doctor tells you not to.  Go to your doctor visits. Get help if:  You feel dizzy.  You have mild cramps or pressure in your lower belly (abdomen).  You have a nagging pain in your belly area.  You continue to feel sick to your stomach (nauseous), throw up (vomit), or have watery poop (diarrhea).  You have bad smelling fluid coming from your vagina.  You have pain with peeing (urination). Get help right away if:  You have a fever.  You are leaking fluid from your vagina.  You have spotting or bleeding from your vagina.  You have severe belly cramping or pain.  You lose or gain weight rapidly.  You have trouble catching your breath and have chest pain.  You notice sudden or extreme puffiness (swelling) of your face, hands, ankles, feet, or legs.  You have not felt the baby move in over an hour.  You have severe headaches that do not go away with medicine.  You have vision changes. This information is not intended to replace advice given to you by your health care provider. Make sure you discuss any questions you have with your health care   provider. Document Released: 10/05/2009 Document Revised: 12/17/2015 Document Reviewed: 09/11/2012 Elsevier Interactive Patient Education  2017 [employer]  

## 2018-04-16 NOTE — Progress Notes (Signed)
Routine Prenatal Care Visit  Subjective  Jennifer Nelson is a 22 y.o. G1P0000 at [redacted]w[redacted]d being seen today for ongoing prenatal care.  She is currently monitored for the following issues for this low-risk pregnancy and has Supervision of normal first pregnancy, antepartum on their problem list.  ----------------------------------------------------------------------------------- Patient reports no complaints.    . Vag. Bleeding: None.  Movement: Absent. Denies leaking of fluid.  U/S incomplete today ----------------------------------------------------------------------------------- The following portions of the patient's history were reviewed and updated as appropriate: allergies, current medications, past family history, past medical history, past social history, past surgical history and problem list. Problem list updated.  Objective  Blood pressure 112/70, weight 161 lb (73 kg), last menstrual period 11/28/2017. Pregravid weight 156 lb (70.8 kg) Total Weight Gain 5 lb (2.268 kg) Urinalysis: Urine Protein Negative  Urine Glucose Negative  Fetal Status: Fetal Heart Rate (bpm): present   Movement: Absent     General:  Alert, oriented and cooperative. Patient is in no acute distress.  Skin: Skin is warm and dry. No rash noted.   Cardiovascular: Normal heart rate noted  Respiratory: Normal respiratory effort, no problems with respiration noted  Abdomen: Soft, gravid, appropriate for gestational age. Pain/Pressure: Absent     Pelvic:  Cervical exam deferred        Extremities: Normal range of motion.  Edema: None  Mental Status: Normal mood and affect. Normal behavior. Normal judgment and thought content.   US Ob Comp + 14 Wk  Result Date: 04/16/2018 Patient Name: Jennifer Nelson DOB: 05-Dec-1995 MRN: [id number] ULTRASOUND REPORT Location: [facility] Date of Service: 04/16/2018 Indications:Anatomy Ultrasound Findings: Mason Jim intrauterine pregnancy is visualized with FHR at 150 BPM.  Biometrics give an (U/S) Gestational age of [redacted]w[redacted]d and an (U/S) EDD of 09/06/18; this correlates with the clinically established Estimated Date of Delivery: 09/04/18 Fetal presentation is Breech. EFW: 11oz. Placenta: anterior. Grade: 0, placenta to cervix = 3.41cm AFI: subjectively normal. Anatomic survey is incomplete for cardiac voews, posterior fossa and lateral ventrcle of the brain, kidneys, 3vc, spine is also suboptimally viewed due to fetal position; Gender - female.  Right Ovary is normal in appearance. Left Ovary is normal appearance. Survey of the adnexa demonstrates no adnexal masses. There is no free peritoneal fluid in the cul de sac. Impression: 1. [redacted]w[redacted]d Viable Singleton Intrauterine pregnancy by U/S. 2. (U/S) EDD is consistent with Clinically established Estimated Date of Delivery: 09/04/18 . 3. Normal Anatomy Scan, incomplete for cardiac voews, posterior fossa and lateral ventrcle of the brain, kidneys, 3vc, spine is also suboptimally viewed. Recommendations: 1.Clinical correlation with the patient's History and Physical Exam. 2. Completion anatomy ultrasound in 2-4 weeks Abeer Alsammarraie, RDMS There is a singleton gestation with subjectively normal amniotic fluid volume. The fetal biometry correlates with established dating. Detailed evaluation of the fetal anatomy was performed.The fetal anatomical survey appears within normal limits within the resolution of ultrasound as described above.  Not all structures were able to be visualized on today's study.  It is recommended that a follow-up ultrasound be performed to complete visualization of the unobserved structures today.  It must be noted that a normal ultrasound is unable to rule out fetal aneuploidy nor is it able to detect all possible malformations.   The ultrasound images and findings were reviewed by me and I agree with the above report. Thomasene Mohair, MD, [facility], [facility] 04/16/2018 4:38 PM      Assessment    22 y.o. G1P0000 at  5737w6d by  09/04/2018, by Last Menstrual Period presenting for routine prenatal visit  Plan   Pregnancy#1 Problems (from 11/28/17 to present)    Problem Noted Resolved   Supervision of normal first pregnancy, antepartum 01/22/2018 by Tresea MallGledhill, Jane, CNM No   Overview Addendum 03/19/2018  3:38 PM by Natale MilchSchuman, Christanna R, MD    Clinic [facility] Prenatal Labs  Dating LMP = 519w4d US Blood type: O/Positive/-- (07/01 1053)   Genetic Screen Inheritest NIPS: diploid XY Antibody:Negative (07/01 1053)  Anatomic US  Rubella: 3.17 (07/01 1053) Varicella: Immune  GTT  RPR: Non Reactive (07/01 1053)   Rhogam N/A HBsAg: Negative (07/01 1053)   TDaP vaccine [ ]  30 weeks     Flu Shot:03/19/18 HIV: Non Reactive (07/01 1053)   Baby Food     bottle                           GBS:   Contraception Given information Pap: 07/03/2017 [facility] [ ]  repeat postpartum  CBB     CS/VBAC NA   Support Person Husband Phillip          Preterm labor symptoms and general obstetric precautions including but not limited to vaginal bleeding, contractions, leaking of fluid and fetal movement were reviewed in detail with the patient. Please refer to After Visit Summary for other counseling recommendations.   Return in about 2 weeks (around 04/30/2018) for schedule completion anatomy ultrasound, routine prenatal.  Thomasene MohairStephen Rihan Schueler, MD, [facility] FrederickFACOG [facility], [facility] 04/16/2018 4:46 PM

## 2018-04-30 ENCOUNTER — Telehealth: Payer: Self-pay

## 2018-04-30 ENCOUNTER — Ambulatory Visit (INDEPENDENT_AMBULATORY_CARE_PROVIDER_SITE_OTHER): Payer: BLUE CROSS/BLUE SHIELD | Admitting: Obstetrics and Gynecology

## 2018-04-30 VITALS — BP 118/76 | Wt 170.0 lb

## 2018-04-30 DIAGNOSIS — Z3A21 21 weeks gestation of pregnancy: Secondary | ICD-10-CM

## 2018-04-30 DIAGNOSIS — W5501XA Bitten by cat, initial encounter: Secondary | ICD-10-CM

## 2018-04-30 MED ORDER — AMOXICILLIN-POT CLAVULANATE 875-125 MG PO TABS: 1.0000 | ORAL_TABLET | Freq: Two times a day (BID) | ORAL | 0 refills | Status: DC

## 2018-04-30 NOTE — Telephone Encounter (Signed)
Pt was bitten by a cat. It's pretty deep & swollen. She's read that they can get pretty infected. She has spoken w/PCP who advised her to contact us since she is pregnant. RU#[phone]

## 2018-04-30 NOTE — Telephone Encounter (Signed)
Yes, please have her come in to see me for an add on visit today so I can take a look at it. She will need antibiotics. Thank you,  Dr. Jerene Pitch

## 2018-04-30 NOTE — Progress Notes (Signed)
Pt c/o getting bit by cat yesterday. No fever, some swelling.

## 2018-04-30 NOTE — Progress Notes (Signed)
Routine Prenatal Care Visit  Subjective  Jennifer Nelson is a 22 y.o. G1P0000 at [redacted]w[redacted]d being seen today for ongoing prenatal care.  She is currently monitored for the following issues for this low-risk pregnancy and has Supervision of normal first pregnancy, antepartum on their problem list.  ----------------------------------------------------------------------------------- Patient reports that she was bite by her cat yesterday morning. She read on the internet that cat bite infections can be serious. She has not felt feverish, but does not have a thermometer and has not taken her temperature.     . Vag. Bleeding: None.  Movement: Present. Denies leaking of fluid.  ----------------------------------------------------------------------------------- The following portions of the patient's history were reviewed and updated as appropriate: allergies, current medications, past family history, past medical history, past social history, past surgical history and problem list. Problem list updated.   Objective  Blood pressure 118/76, weight 170 lb (77.1 kg), last menstrual period 11/28/2017. Pregravid weight 156 lb (70.8 kg) Total Weight Gain 14 lb (6.35 kg) Urinalysis:      Fetal Status:     Movement: Present     General:  Alert, oriented and cooperative. Patient is in no acute distress.  Skin: Skin is warm and dry. No rash noted.   Cardiovascular: Normal heart rate noted  Respiratory: Normal respiratory effort, no problems with respiration noted  Abdomen: Soft, gravid, appropriate for gestational age. Pain/Pressure: Absent     Pelvic:  Cervical exam deferred        Extremities: Normal range of motion.     ental Status: Normal mood and affect. Normal behavior. Normal judgment and thought content.   Photo taken an uploaded to my chart. Left pointer finger with puncture wound. Small erythema, moderate swelling of the finger. Normal range of motion.    Assessment   22 y.o. G1P0000 at  [redacted]w[redacted]d by  09/04/2018, by Last Menstrual Period presenting for routine prenatal visit  Plan   Pregnancy#1 Problems (from 11/28/17 to present)    Problem Noted Resolved   Supervision of normal first pregnancy, antepartum 01/22/2018 by Tresea Mall, CNM No   Overview Addendum 03/19/2018  3:38 PM by Natale Milch, MD    Clinic [facility] Prenatal Labs  Dating LMP = [redacted]w[redacted]d [place] Blood type: O/Positive/-- (07/01 1053)   Genetic Screen Inheritest NIPS: diploid XY Antibody:Negative (07/01 1053)  Anatomic [place]  Rubella: 3.17 (07/01 1053) Varicella: Immune  GTT  RPR: Non Reactive (07/01 1053)   Rhogam N/A HBsAg: Negative (07/01 1053)   TDaP vaccine [ ]  30 weeks     Flu Shot:03/19/18 HIV: Non Reactive (07/01 1053)   Baby Food     bottle                           GBS:   Contraception Given information Pap: 07/03/2017 [facility] ([facility]) [ ]  repeat postpartum  CBB     CS/VBAC NA   Support Person Husband [name]               Gestational age appropriate obstetric precautions including but not limited to vaginal bleeding, contractions, leaking of fluid and fetal movement were reviewed in detail with the patient.    Will give antibiotic prophylaxis for 7 days. Encouraged her to monitor the bite, if there is not an improvement in the swelling in 2 days or if there is a worsening of her finger she should return to the office or be seen in the ER.   Follow  up at next ROB visit this week as planned.   Adelene Idler MD [facility], [facility] 04/30/18 10:00 AM

## 2018-04-30 NOTE — Telephone Encounter (Signed)
Pt has apt today.

## 2018-05-02 ENCOUNTER — Ambulatory Visit (INDEPENDENT_AMBULATORY_CARE_PROVIDER_SITE_OTHER): Payer: BLUE CROSS/BLUE SHIELD | Admitting: Obstetrics and Gynecology

## 2018-05-02 ENCOUNTER — Encounter: Payer: Self-pay | Admitting: Obstetrics and Gynecology

## 2018-05-02 ENCOUNTER — Ambulatory Visit (INDEPENDENT_AMBULATORY_CARE_PROVIDER_SITE_OTHER): Payer: BLUE CROSS/BLUE SHIELD

## 2018-05-02 VITALS — BP 116/82 | Wt 172.0 lb

## 2018-05-02 DIAGNOSIS — Z3A22 22 weeks gestation of pregnancy: Secondary | ICD-10-CM

## 2018-05-02 DIAGNOSIS — Z362 Encounter for other antenatal screening follow-up: Secondary | ICD-10-CM | POA: Diagnosis not present

## 2018-05-02 DIAGNOSIS — Z3A19 19 weeks gestation of pregnancy: Secondary | ICD-10-CM

## 2018-05-02 DIAGNOSIS — Z3400 Encounter for supervision of normal first pregnancy, unspecified trimester: Secondary | ICD-10-CM

## 2018-05-02 DIAGNOSIS — Z3402 Encounter for supervision of normal first pregnancy, second trimester: Secondary | ICD-10-CM

## 2018-05-02 NOTE — Progress Notes (Signed)
Routine Prenatal Care Visit  Subjective  Jennifer Nelson is a 22 y.o. G1P0000 at [redacted]w[redacted]d being seen today for ongoing prenatal care.  She is currently monitored for the following issues for this low-risk pregnancy and has Supervision of normal first pregnancy, antepartum on their problem list.  ----------------------------------------------------------------------------------- Patient reports no complaints.    . Vag. Bleeding: None.  Movement: Absent. Denies leaking of fluid.  Ultrasound still incomplete for posterior fossa.  However other anatomic structures visualized today and appear normal. The patient still has not noted fetal movement yet.  However, she was able to visualize frequent fetal movement on ultrasound today and was reassured. ----------------------------------------------------------------------------------- The following portions of the patient's history were reviewed and updated as appropriate: allergies, current medications, past family history, past medical history, past social history, past surgical history and problem list. Problem list updated.   Objective  Blood pressure 116/82, weight 172 lb (78 kg), last menstrual period 11/28/2017. Pregravid weight 156 lb (70.8 kg) Total Weight Gain 16 lb (7.258 kg) Urinalysis: Urine Protein    Urine Glucose    Fetal Status: Fetal Heart Rate (bpm): present   Movement: Absent     General:  Alert, oriented and cooperative. Patient is in no acute distress.  Skin: Skin is warm and dry. No rash noted.   Cardiovascular: Normal heart rate noted  Respiratory: Normal respiratory effort, no problems with respiration noted  Abdomen: Soft, gravid, appropriate for gestational age. Pain/Pressure: Absent     Pelvic:  Cervical exam deferred        Extremities: Normal range of motion.  Edema: None  Mental Status: Normal mood and affect. Normal behavior. Normal judgment and thought content.    Imaging Results: US Ob Follow Up  Result  Date: 05/02/2018 Patient Name: ANNALIESE SAEZ DOB: 03/26/1996 MRN: [id number] ULTRASOUND REPORT Location: [facility] Date of Service: 05/02/2018 Indications: Anatomy follow up ultrasound Findings: Mason Jim intrauterine pregnancy is visualized with FHR at 142 BPM. Fetal presentation is Cephalic. Placenta: anterior. Grade: 0 AFI: subjectively normal. Anatomic survey is incomplete for cerebellum and cisterna magna. Lateral ventricles, 4 chamber heart/outflows tracts, kidneys, diaphragm, 3 vessel cord and spine were all seen today and appear within normal limits.  There is no free peritoneal fluid in the cul de sac. Impression: 1. [redacted]w[redacted]d Viable Singleton Intrauterine pregnancy previously established criteria. 2. Normal Anatomy Scan is still incomplete for cerebellum and cisterna magna. Recommendations:  Follow up posterior fossa. Darlina Guys, RDMS RVT There is a singleton gestation with subjectively normal amniotic fluid volume. The fetal biometry correlates with established dating. Detailed evaluation of the fetal anatomy was performed.The fetal anatomical survey appears within normal limits within the resolution of ultrasound as described above.  Not all structures were able to be visualized on today's study.  It is recommended that a follow-up ultrasound be performed to complete visualization of the unobserved structures today.  It must be noted that a normal ultrasound is unable to rule out fetal aneuploidy nor is it able to detect all possible malformations.   The ultrasound images and findings were reviewed by me and I agree with the above report. Thomasene Mohair, MD, [facility], [facility] 05/02/2018 4:18 PM      Assessment   22 y.o. G1P0000 at [redacted]w[redacted]d by  09/04/2018, by Last Menstrual Period presenting for routine prenatal visit  Plan   Pregnancy#1 Problems (from 11/28/17 to present)    Problem Noted Resolved   Supervision of normal first pregnancy, antepartum 01/22/2018  by  Tresea Mall, CNM No   Overview Addendum 03/19/2018  3:38 PM by Natale Milch, MD    Clinic [facility] Prenatal Labs  Dating LMP = [redacted]w[redacted]d [place] Blood type: O/Positive/-- (07/01 1053)   Genetic Screen Inheritest NIPS: diploid XY Antibody:Negative (07/01 1053)  Anatomic [place]  Rubella: 3.17 (07/01 1053) Varicella: Immune  GTT  RPR: Non Reactive (07/01 1053)   Rhogam N/A HBsAg: Negative (07/01 1053)   TDaP vaccine [ ]  30 weeks     Flu Shot:03/19/18 HIV: Non Reactive (07/01 1053)   Baby Food     bottle                           GBS:   Contraception Given information Pap: 07/03/2017 [facility]) [ ]  repeat postpartum  CBB     CS/VBAC NA   Support Person Husband [name]               Preterm labor symptoms and general obstetric precautions including but not limited to vaginal bleeding, contractions, leaking of fluid and fetal movement were reviewed in detail with the patient. Please refer to After Visit Summary for other counseling recommendations.   -Follow-up in 2 weeks for completion of anatomy ultrasound.  Return in about 2 weeks (around 05/16/2018) for completion anatomy ultrasound and routine prenatal.  Thomasene Mohair, MD, [facility], [facility] 05/02/2018 4:25 PM

## 2018-05-16 ENCOUNTER — Ambulatory Visit (INDEPENDENT_AMBULATORY_CARE_PROVIDER_SITE_OTHER): Payer: BLUE CROSS/BLUE SHIELD | Admitting: Obstetrics & Gynecology

## 2018-05-16 ENCOUNTER — Ambulatory Visit (INDEPENDENT_AMBULATORY_CARE_PROVIDER_SITE_OTHER): Payer: BLUE CROSS/BLUE SHIELD

## 2018-05-16 VITALS — BP 108/60 | Wt 170.0 lb

## 2018-05-16 DIAGNOSIS — Z3400 Encounter for supervision of normal first pregnancy, unspecified trimester: Secondary | ICD-10-CM

## 2018-05-16 DIAGNOSIS — Z131 Encounter for screening for diabetes mellitus: Secondary | ICD-10-CM

## 2018-05-16 DIAGNOSIS — Z362 Encounter for other antenatal screening follow-up: Secondary | ICD-10-CM | POA: Diagnosis not present

## 2018-05-16 DIAGNOSIS — Z3A24 24 weeks gestation of pregnancy: Secondary | ICD-10-CM

## 2018-05-16 DIAGNOSIS — Z3402 Encounter for supervision of normal first pregnancy, second trimester: Secondary | ICD-10-CM

## 2018-05-16 LAB — POCT URINALYSIS DIPSTICK OB
Glucose, UA: NEGATIVE
POC,PROTEIN,UA: NEGATIVE

## 2018-05-16 NOTE — Progress Notes (Signed)
  Subjective  Fetal Movement? yes Contractions? no Leaking Fluid? no Vaginal Bleeding? no Mild GERD Objective  BP 108/60   Wt 170 lb (77.1 kg)   LMP 11/28/2017 (Exact Date)   BMI 26.63 kg/m  General: NAD Pumonary: no increased work of breathing Abdomen: gravid, non-tender Extremities: no edema Psychiatric: mood appropriate, affect full  Assessment  22 y.o. G1P0000 at [redacted]w[redacted]d by  09/04/2018, by Last Menstrual Period presenting for routine prenatal visit  Plan   Problem List Items Addressed This Visit      Other   Supervision of normal first pregnancy, antepartum    Other Visit Diagnoses    [redacted] weeks gestation of pregnancy    -  Primary   Screening for diabetes mellitus (DM)       Relevant Orders   28 Week RH+Panel    Review of ULTRASOUND.    I have personally reviewed images and report of recent ultrasound done at [facility].    Plan of management to be discussed with patient. Glc nv  Annamarie Major, MD, [facility], [facility] 05/16/2018  4:39 PM

## 2018-05-16 NOTE — Patient Instructions (Signed)

## 2018-05-16 NOTE — Addendum Note (Signed)
Addended by: Cornelius Moras D on: 05/16/2018 04:49 PM   Modules accepted: Orders

## 2018-06-15 ENCOUNTER — Encounter: Payer: BLUE CROSS/BLUE SHIELD | Admitting: Obstetrics and Gynecology

## 2018-06-15 ENCOUNTER — Other Ambulatory Visit: Payer: BLUE CROSS/BLUE SHIELD

## 2018-06-15 ENCOUNTER — Ambulatory Visit (INDEPENDENT_AMBULATORY_CARE_PROVIDER_SITE_OTHER): Payer: BLUE CROSS/BLUE SHIELD | Admitting: Obstetrics & Gynecology

## 2018-06-15 VITALS — BP 120/60 | Wt 178.0 lb

## 2018-06-15 DIAGNOSIS — Z3403 Encounter for supervision of normal first pregnancy, third trimester: Secondary | ICD-10-CM

## 2018-06-15 DIAGNOSIS — Z3A28 28 weeks gestation of pregnancy: Secondary | ICD-10-CM

## 2018-06-15 DIAGNOSIS — Z3400 Encounter for supervision of normal first pregnancy, unspecified trimester: Secondary | ICD-10-CM

## 2018-06-15 DIAGNOSIS — Z131 Encounter for screening for diabetes mellitus: Secondary | ICD-10-CM

## 2018-06-15 LAB — POCT URINALYSIS DIPSTICK OB
Glucose, UA: NEGATIVE
POC,PROTEIN,UA: NEGATIVE

## 2018-06-15 NOTE — Patient Instructions (Signed)
Third Trimester of Pregnancy The third trimester is from week 28 through week 40 (months 7 through 9). The third trimester is a time when the unborn baby (fetus) is growing rapidly. At the end of the ninth month, the fetus is about 20 inches in length and weighs 6-10 pounds. Body changes during your third trimester Your body will continue to go through many changes during pregnancy. The changes vary from woman to woman. During the third trimester:  Your weight will continue to increase. You can expect to gain 25-35 pounds (11-16 kg) by the end of the pregnancy.  You may begin to get stretch marks on your hips, abdomen, and breasts.  You may urinate more often because the fetus is moving lower into your pelvis and pressing on your bladder.  You may develop or continue to have heartburn. This is caused by increased hormones that slow down muscles in the digestive tract.  You may develop or continue to have constipation because increased hormones slow digestion and cause the muscles that push waste through your intestines to relax.  You may develop hemorrhoids. These are swollen veins (varicose veins) in the rectum that can itch or be painful.  You may develop swollen, bulging veins (varicose veins) in your legs.  You may have increased body aches in the pelvis, back, or thighs. This is due to weight gain and increased hormones that are relaxing your joints.  You may have changes in your hair. These can include thickening of your hair, rapid growth, and changes in texture. Some women also have hair loss during or after pregnancy, or hair that feels dry or thin. Your hair will most likely return to normal after your baby is born.  Your breasts will continue to grow and they will continue to become tender. A yellow fluid (colostrum) may leak from your breasts. This is the first milk you are producing for your baby.  Your belly button may stick out.  You may notice more swelling in your hands,  face, or ankles.  You may have increased tingling or numbness in your hands, arms, and legs. The skin on your belly may also feel numb.  You may feel short of breath because of your expanding uterus.  You may have more problems sleeping. This can be caused by the size of your belly, increased need to urinate, and an increase in your body's metabolism.  You may notice the fetus "dropping," or moving lower in your abdomen (lightening).  You may have increased vaginal discharge.  You may notice your joints feel loose and you may have pain around your pelvic bone.  What to expect at prenatal visits You will have prenatal exams every 2 weeks until week 36. Then you will have weekly prenatal exams. During a routine prenatal visit:  You will be weighed to make sure you and the baby are growing normally.  Your blood pressure will be taken.  Your abdomen will be measured to track your baby's growth.  The fetal heartbeat will be listened to.  Any test results from the previous visit will be discussed.  You may have a cervical check near your due date to see if your cervix has softened or thinned (effaced).  You will be tested for Group B streptococcus. This happens between 35 and 37 weeks.  Your health care provider may ask you:  What your birth plan is.  How you are feeling.  If you are feeling the baby move.  If you have had   any abnormal symptoms, such as leaking fluid, bleeding, severe headaches, or abdominal cramping.  If you are using any tobacco products, including cigarettes, chewing tobacco, and electronic cigarettes.  If you have any questions.  Other tests or screenings that may be performed during your third trimester include:  Blood tests that check for low iron levels (anemia).  Fetal testing to check the health, activity level, and growth of the fetus. Testing is done if you have certain medical conditions or if there are problems during the  pregnancy.  Nonstress test (NST). This test checks the health of your baby to make sure there are no signs of problems, such as the baby not getting enough oxygen. During this test, a belt is placed around your belly. The baby is made to move, and its heart rate is monitored during movement.  What is false labor? False labor is a condition in which you feel small, irregular tightenings of the muscles in the womb (contractions) that usually go away with rest, changing position, or drinking water. These are called Braxton Hicks contractions. Contractions may last for hours, days, or even weeks before true labor sets in. If contractions come at regular intervals, become more frequent, increase in intensity, or become painful, you should see your health care provider. What are the signs of labor?  Abdominal cramps.  Regular contractions that start at 10 minutes apart and become stronger and more frequent with time.  Contractions that start on the top of the uterus and spread down to the lower abdomen and back.  Increased pelvic pressure and dull back pain.  A watery or bloody mucus discharge that comes from the vagina.  Leaking of amniotic fluid. This is also known as your "water breaking." It could be a slow trickle or a gush. Let your health care provider know if it has a color or strange odor. If you have any of these signs, call your health care provider right away, even if it is before your due date. Follow these instructions at home: Medicines  Follow your health care provider's instructions regarding medicine use. Specific medicines may be either safe or unsafe to take during pregnancy.  Take a prenatal vitamin that contains at least 600 micrograms (mcg) of folic acid.  If you develop constipation, try taking a stool softener if your health care provider approves. Eating and drinking  Eat a balanced diet that includes fresh fruits and vegetables, whole grains, good sources of protein  such as meat, eggs, or tofu, and low-fat dairy. Your health care provider will help you determine the amount of weight gain that is right for you.  Avoid raw meat and uncooked cheese. These carry germs that can cause birth defects in the baby.  If you have low calcium intake from food, talk to your health care provider about whether you should take a daily calcium supplement.  Eat four or five small meals rather than three large meals a day.  Limit foods that are high in fat and processed sugars, such as fried and sweet foods.  To prevent constipation: ? Drink enough fluid to keep your urine clear or pale yellow. ? Eat foods that are high in fiber, such as fresh fruits and vegetables, whole grains, and beans. Activity  Exercise only as directed by your health care provider. Most women can continue their usual exercise routine during pregnancy. Try to exercise for 30 minutes at least 5 days a week. Stop exercising if you experience uterine contractions.  Avoid heavy   lifting.  Do not exercise in extreme heat or humidity, or at high altitudes.  Wear low-heel, comfortable shoes.  Practice good posture.  You may continue to have sex unless your health care provider tells you otherwise. Relieving pain and discomfort  Take frequent breaks and rest with your legs elevated if you have leg cramps or low back pain.  Take warm sitz baths to soothe any pain or discomfort caused by hemorrhoids. Use hemorrhoid cream if your health care provider approves.  Wear a good support bra to prevent discomfort from breast tenderness.  If you develop varicose veins: ? Wear support pantyhose or compression stockings as told by your healthcare provider. ? Elevate your feet for 15 minutes, 3-4 times a day. Prenatal care  Write down your questions. Take them to your prenatal visits.  Keep all your prenatal visits as told by your health care provider. This is important. Safety  Wear your seat belt at  all times when driving.  Make a list of emergency phone numbers, including numbers for family, friends, the hospital, and police and fire departments. General instructions  Avoid cat litter boxes and soil used by cats. These carry germs that can cause birth defects in the baby. If you have a cat, ask someone to clean the litter box for you.  Do not travel far distances unless it is absolutely necessary and only with the approval of your health care provider.  Do not use hot tubs, steam rooms, or saunas.  Do not drink alcohol.  Do not use any products that contain nicotine or tobacco, such as cigarettes and e-cigarettes. If you need help quitting, ask your health care provider.  Do not use any medicinal herbs or unprescribed drugs. These chemicals affect the formation and growth of the baby.  Do not douche or use tampons or scented sanitary pads.  Do not cross your legs for long periods of time.  To prepare for the arrival of your baby: ? Take prenatal classes to understand, practice, and ask questions about labor and delivery. ? Make a trial run to the hospital. ? Visit the hospital and tour the maternity area. ? Arrange for maternity or paternity leave through employers. ? Arrange for family and friends to take care of pets while you are in the hospital. ? Purchase a rear-facing car seat and make sure you know how to install it in your car. ? Pack your hospital bag. ? Prepare the baby's nursery. Make sure to remove all pillows and stuffed animals from the baby's crib to prevent suffocation.  Visit your dentist if you have not gone during your pregnancy. Use a soft toothbrush to brush your teeth and be gentle when you floss. Contact a health care provider if:  You are unsure if you are in labor or if your water has broken.  You become dizzy.  You have mild pelvic cramps, pelvic pressure, or nagging pain in your abdominal area.  You have lower back pain.  You have persistent  nausea, vomiting, or diarrhea.  You have an unusual or bad smelling vaginal discharge.  You have pain when you urinate. Get help right away if:  Your water breaks before 37 weeks.  You have regular contractions less than 5 minutes apart before 37 weeks.  You have a fever.  You are leaking fluid from your vagina.  You have spotting or bleeding from your vagina.  You have severe abdominal pain or cramping.  You have rapid weight loss or weight gain.    You have shortness of breath with chest pain.  You notice sudden or extreme swelling of your face, hands, ankles, feet, or legs.  Your baby makes fewer than 10 movements in 2 hours.  You have severe headaches that do not go away when you take medicine.  You have vision changes. Summary  The third trimester is from week 28 through week 40, months 7 through 9. The third trimester is a time when the unborn baby (fetus) is growing rapidly.  During the third trimester, your discomfort may increase as you and your baby continue to gain weight. You may have abdominal, leg, and back pain, sleeping problems, and an increased need to urinate.  During the third trimester your breasts will keep growing and they will continue to become tender. A yellow fluid (colostrum) may leak from your breasts. This is the first milk you are producing for your baby.  False labor is a condition in which you feel small, irregular tightenings of the muscles in the womb (contractions) that eventually go away. These are called Braxton Hicks contractions. Contractions may last for hours, days, or even weeks before true labor sets in.  Signs of labor can include: abdominal cramps; regular contractions that start at 10 minutes apart and become stronger and more frequent with time; watery or bloody mucus discharge that comes from the vagina; increased pelvic pressure and dull back pain; and leaking of amniotic fluid. This information is not intended to replace advice  given to you by your health care provider. Make sure you discuss any questions you have with your health care provider. Document Released: 07/05/2001 Document Revised: 12/17/2015 Document Reviewed: 09/11/2012 Elsevier Interactive Patient Education  2017 Elsevier Inc.  

## 2018-06-15 NOTE — Progress Notes (Signed)
  Subjective  Fetal Movement? yes Contractions? no Leaking Fluid? no Vaginal Bleeding? no  Objective  BP 120/60   Wt 178 lb (80.7 kg)   LMP 11/28/2017 (Exact Date)   BMI 27.88 kg/m  General: NAD Pumonary: no increased work of breathing Abdomen: gravid, non-tender Extremities: no edema Psychiatric: mood appropriate, affect full  Assessment  22 y.o. G1P0000 at 2233w3d by  09/04/2018, by Last Menstrual Period presenting for routine prenatal visit  Plan   Problem List Items Addressed This Visit      Other   Supervision of normal first pregnancy, antepartum    Other Visit Diagnoses    [redacted] weeks gestation of pregnancy    -  Primary   Relevant Orders   POC Urinalysis Dipstick OB (Completed)    Bottle Unsure BC  Jennifer MajorPaul Indio Santilli, MD, FACOG [facility], [facility] 06/15/2018  3:31 PM

## 2018-06-16 LAB — 28 WEEK RH+PANEL
Basophils Absolute: 0 10*3/uL (ref 0.0–0.2)
Basos: 0 %
EOS (ABSOLUTE): 0.1 10*3/uL (ref 0.0–0.4)
Eos: 1 %
Gestational Diabetes Screen: 110 mg/dL (ref 65–139)
HIV Screen 4th Generation wRfx: NONREACTIVE
Hematocrit: 31.8 % — ABNORMAL LOW (ref 34.0–46.6)
Hemoglobin: 11.2 g/dL (ref 11.1–15.9)
Immature Grans (Abs): 0.1 10*3/uL (ref 0.0–0.1)
Immature Granulocytes: 1 %
Lymphocytes Absolute: 2 10*3/uL (ref 0.7–3.1)
Lymphs: 18 %
MCH: 33.9 pg — ABNORMAL HIGH (ref 26.6–33.0)
MCHC: 35.2 g/dL (ref 31.5–35.7)
MCV: 96 fL (ref 79–97)
Monocytes Absolute: 0.7 10*3/uL (ref 0.1–0.9)
Monocytes: 7 %
Neutrophils Absolute: 7.8 10*3/uL — ABNORMAL HIGH (ref 1.4–7.0)
Neutrophils: 73 %
Platelets: 223 10*3/uL (ref 150–450)
RBC: 3.3 x10E6/uL — ABNORMAL LOW (ref 3.77–5.28)
RDW: 11.8 % — ABNORMAL LOW (ref 12.3–15.4)
RPR Ser Ql: NONREACTIVE
WBC: 10.8 10*3/uL (ref 3.4–10.8)

## 2018-06-19 ENCOUNTER — Telehealth: Payer: Self-pay

## 2018-06-19 NOTE — Telephone Encounter (Signed)
FMLA/DISABILITY forms (2) for [employer] (pt and FOB) filled out, signature obtained and given to [place] for processing.

## 2018-06-20 ENCOUNTER — Encounter: Payer: Self-pay | Admitting: Obstetrics & Gynecology

## 2018-06-20 NOTE — Telephone Encounter (Signed)
Please advise 

## 2018-06-26 ENCOUNTER — Telehealth: Payer: Self-pay

## 2018-06-26 NOTE — Telephone Encounter (Signed)
Pt states she woke up this morning feeling like she's been hit by a bus.  Her PCP won't see her b/c she's pregnant.  She has appt Fri. [phone] Pt doesn't think she did too much over Thanksgiving and the weekend.  States she rested all day Saturday and Sunday.  Adv to take tylenol and rest.  Pt to keep appt Friday.

## 2018-06-29 ENCOUNTER — Ambulatory Visit (INDEPENDENT_AMBULATORY_CARE_PROVIDER_SITE_OTHER): Payer: BLUE CROSS/BLUE SHIELD | Admitting: Advanced Practice Midwife

## 2018-06-29 ENCOUNTER — Encounter: Payer: Self-pay | Admitting: Advanced Practice Midwife

## 2018-06-29 VITALS — BP 124/62 | Wt 182.0 lb

## 2018-06-29 DIAGNOSIS — R519 Headache, unspecified: Secondary | ICD-10-CM

## 2018-06-29 DIAGNOSIS — R51 Headache: Secondary | ICD-10-CM

## 2018-06-29 DIAGNOSIS — O26893 Other specified pregnancy related conditions, third trimester: Secondary | ICD-10-CM

## 2018-06-29 DIAGNOSIS — Z3A30 30 weeks gestation of pregnancy: Secondary | ICD-10-CM

## 2018-06-29 LAB — POCT URINALYSIS DIPSTICK OB: Glucose, UA: NEGATIVE

## 2018-06-29 MED ORDER — BUTALBITAL-APAP-CAFFEINE 50-325-40 MG PO CAPS: 1.0000 | ORAL_CAPSULE | Freq: Four times a day (QID) | ORAL | 1 refills | Status: DC | PRN

## 2018-06-29 NOTE — Progress Notes (Signed)
ROB Headaches Hands going numb TDAP nv per pt. request

## 2018-06-29 NOTE — Progress Notes (Signed)
  Routine Prenatal Care Visit  Subjective  Jennifer Nelson is a 22 y.o. G1P0000 at 7352w3d being seen today for ongoing prenatal care.  She is currently monitored for the following issues for this low-risk pregnancy and has Supervision of normal first pregnancy, antepartum on their problem list.  ----------------------------------------------------------------------------------- Patient reports 2 new onset headaches in the past week. The first one occurred on Tuesday and lasted until Wednesday. She took tylenol with minimal relief. The second one started just this morning. Discussed headache triggers and comfort measures. Recommend 250 mg Magnesium supplement. Rx given for Fioricet. Encouraged adequate hydration.   Contractions: Not present. Vag. Bleeding: None.  Movement: Present. Denies leaking of fluid.  ----------------------------------------------------------------------------------- The following portions of the patient's history were reviewed and updated as appropriate: allergies, current medications, past family history, past medical history, past social history, past surgical history and problem list. Problem list updated.   Objective  Blood pressure 124/62, weight 182 lb (82.6 kg), last menstrual period 11/28/2017. Pregravid weight 156 lb (70.8 kg) Total Weight Gain 26 lb (11.8 kg) Urinalysis: Urine Protein Small (1+)  Urine Glucose Negative  Fetal Status: Fetal Heart Rate (bpm): 150 Fundal Height: 30 cm Movement: Present     General:  Alert, oriented and cooperative. Patient is in no acute distress.  Skin: Skin is warm and dry. No rash noted.   Cardiovascular: Normal heart rate noted  Respiratory: Normal respiratory effort, no problems with respiration noted  Abdomen: Soft, gravid, appropriate for gestational age. Pain/Pressure: Absent     Pelvic:  Cervical exam deferred        Extremities: Normal range of motion.  Edema: None  Mental Status: Normal mood and affect. Normal  behavior. Normal judgment and thought content.   Assessment   22 y.o. G1P0000 at 452w3d by  09/04/2018, by Last Menstrual Period presenting for routine prenatal visit  Plan   Pregnancy#1 Problems (from 11/28/17 to present)    Problem Noted Resolved   Supervision of normal first pregnancy, antepartum 01/22/2018 by Tresea MallGledhill, Draedyn Weidinger, CNM No   Overview Addendum 03/19/2018  3:38 PM by Natale MilchSchuman, Christanna R, MD    Clinic [facility] Prenatal Labs  Dating LMP = 6972w4d US Blood type: O/Positive/-- (07/01 1053)   Genetic Screen Inheritest NIPS: diploid XY Antibody:Negative (07/01 1053)  Anatomic US  Rubella: 3.17 (07/01 1053) Varicella: Immune  GTT  RPR: Non Reactive (07/01 1053)   Rhogam N/A HBsAg: Negative (07/01 1053)   TDaP vaccine [ ]  30 weeks     Flu Shot:03/19/18 HIV: Non Reactive (07/01 1053)   Baby Food     bottle                           GBS:   Contraception Given information Pap: 07/03/2017 [facility] [ ]  repeat postpartum  CBB     CS/VBAC NA   Support Person Husband Aneta Minshillip               Preterm labor symptoms and general obstetric precautions including but not limited to vaginal bleeding, contractions, leaking of fluid and fetal movement were reviewed in detail with the patient.    Return in about 2 weeks (around 07/13/2018) for rob.  Tresea MallJane Maudene Stotler, CNM 06/29/2018 1:43 PM

## 2018-07-13 ENCOUNTER — Ambulatory Visit (INDEPENDENT_AMBULATORY_CARE_PROVIDER_SITE_OTHER): Payer: BLUE CROSS/BLUE SHIELD | Admitting: Certified Nurse Midwife

## 2018-07-13 VITALS — BP 100/60 | Wt 189.0 lb

## 2018-07-13 DIAGNOSIS — Z3400 Encounter for supervision of normal first pregnancy, unspecified trimester: Secondary | ICD-10-CM

## 2018-07-13 DIAGNOSIS — Z3A32 32 weeks gestation of pregnancy: Secondary | ICD-10-CM

## 2018-07-13 DIAGNOSIS — G5603 Carpal tunnel syndrome, bilateral upper limbs: Secondary | ICD-10-CM

## 2018-07-13 DIAGNOSIS — Z23 Encounter for immunization: Secondary | ICD-10-CM

## 2018-07-13 DIAGNOSIS — Z3403 Encounter for supervision of normal first pregnancy, third trimester: Secondary | ICD-10-CM

## 2018-07-13 LAB — POCT URINALYSIS DIPSTICK OB: Glucose, UA: NEGATIVE

## 2018-07-13 MED ORDER — TETANUS-DIPHTH-ACELL PERTUSSIS 5-2.5-18.5 LF-MCG/0.5 IM SUSP
0.5000 mL | Freq: Once | INTRAMUSCULAR | Status: AC
  Administered 2018-07-13: 0.5 mL via INTRAMUSCULAR

## 2018-07-13 NOTE — Progress Notes (Signed)
ROB at 32wk3d: Complains of pain and numbness in both hands with right >left and sometimes pain shoots up right arm. Worse during the night. Has tried wearing wrist braces and taking Tylenol with out relief. Hands appear swollen Baby active. Discussed taking childbirth classes TDAP today Bottle/ Discussed birth control options Referral to orthopedics for carpel tunnel symptoms ROB in 2 weeks  Farrel Connersolleen Linzy Laury, CNM

## 2018-07-25 NOTE — L&D Delivery Note (Signed)
Delivery Note Primary OB: [facility] Delivery Physician: Annamarie Major, MD Gestational Age: Full term Antepartum complications: post-term Intrapartum complications: None  A viable Female was delivered via vertex perentation.     "Jennifer Nelson" Apgars:9 ,9  Weight:  pending .   Placenta status: manual removal and Intact.  Cord: 3+ vessels;  with the following complications: none.  Anesthesia:  epidural Episiotomy:  midline without extension Lacerations:  none Suture Repair: 2.0 vicryl Est. Blood Loss (mL):  500 mL  Mom to postpartum.  Baby to Couplet care / Skin to Skin.  Annamarie Major, MD, [facility], [facility] 09/10/2018  6:40 PM [phone]

## 2018-07-27 ENCOUNTER — Ambulatory Visit (INDEPENDENT_AMBULATORY_CARE_PROVIDER_SITE_OTHER): Payer: BLUE CROSS/BLUE SHIELD | Admitting: Certified Nurse Midwife

## 2018-07-27 VITALS — BP 108/60 | Wt 187.0 lb

## 2018-07-27 DIAGNOSIS — Z3400 Encounter for supervision of normal first pregnancy, unspecified trimester: Secondary | ICD-10-CM

## 2018-07-27 DIAGNOSIS — O26893 Other specified pregnancy related conditions, third trimester: Secondary | ICD-10-CM

## 2018-07-27 DIAGNOSIS — Z3A34 34 weeks gestation of pregnancy: Secondary | ICD-10-CM

## 2018-07-27 DIAGNOSIS — G5600 Carpal tunnel syndrome, unspecified upper limb: Secondary | ICD-10-CM

## 2018-07-27 DIAGNOSIS — O26899 Other specified pregnancy related conditions, unspecified trimester: Secondary | ICD-10-CM

## 2018-07-27 LAB — POCT URINALYSIS DIPSTICK OB
Glucose, UA: NEGATIVE
POC,PROTEIN,UA: NEGATIVE

## 2018-07-27 NOTE — Progress Notes (Signed)
ROB Hands swelling 

## 2018-07-28 DIAGNOSIS — G5600 Carpal tunnel syndrome, unspecified upper limb: Secondary | ICD-10-CM | POA: Insufficient documentation

## 2018-07-28 DIAGNOSIS — O26899 Other specified pregnancy related conditions, unspecified trimester: Secondary | ICD-10-CM | POA: Insufficient documentation

## 2018-07-28 NOTE — Progress Notes (Signed)
ROB at 34wk3d: seen by Orthopedics and was diagnosed with bilateral carpel tunnel. Had injections into both wrists which provided relief x 2 days per patient. Wore a wrist brace last night on her right wrist which seems to have helped during he night. Baby active.  Will be bottle feeding. [facility] instructions/ preterm labor precautions ROB in 2 weeks.  Farrel Conners, CNM

## 2018-08-10 ENCOUNTER — Ambulatory Visit (INDEPENDENT_AMBULATORY_CARE_PROVIDER_SITE_OTHER): Payer: BLUE CROSS/BLUE SHIELD | Admitting: Obstetrics & Gynecology

## 2018-08-10 VITALS — BP 120/70 | Wt 190.0 lb

## 2018-08-10 DIAGNOSIS — Z3A36 36 weeks gestation of pregnancy: Secondary | ICD-10-CM

## 2018-08-10 DIAGNOSIS — Z3403 Encounter for supervision of normal first pregnancy, third trimester: Secondary | ICD-10-CM

## 2018-08-10 DIAGNOSIS — Z3400 Encounter for supervision of normal first pregnancy, unspecified trimester: Secondary | ICD-10-CM

## 2018-08-10 NOTE — Progress Notes (Signed)
  Subjective  Fetal Movement? yes Contractions? Yes- occas B-H's Leaking Fluid? no Vaginal Bleeding? no  Objective  BP 120/70   Wt 190 lb (86.2 kg)   LMP 11/28/2017 (Exact Date)   BMI 29.76 kg/m  General: NAD Pumonary: no increased work of breathing Abdomen: gravid, non-tender Extremities: no edema Psychiatric: mood appropriate, affect full SVE- closed, 50%, -3, soft, midposition Assessment  23 y.o. G1P0000 at [redacted]w[redacted]d by  09/04/2018, by Last Menstrual Period presenting for routine prenatal visit  Plan   Problem List Items Addressed This Visit      Other   Supervision of normal first pregnancy, antepartum    Other Visit Diagnoses    [redacted] weeks gestation of pregnancy    -  Primary   Relevant Orders   Culture, beta strep (group b only)    labor precautions discussed  Annamarie Major, MD, [facility], [facility] 08/10/2018  3:25 PM

## 2018-08-10 NOTE — Patient Instructions (Signed)
Braxton Hicks Contractions Contractions of the uterus can occur throughout pregnancy, but they are not always a sign that you are in labor. You may have practice contractions called Braxton Hicks contractions. These false labor contractions are sometimes confused with true labor. What are Braxton Hicks contractions? Braxton Hicks contractions are tightening movements that occur in the muscles of the uterus before labor. Unlike true labor contractions, these contractions do not result in opening (dilation) and thinning of the cervix. Toward the end of pregnancy (32-34 weeks), Braxton Hicks contractions can happen more often and may become stronger. These contractions are sometimes difficult to tell apart from true labor because they can be very uncomfortable. You should not feel embarrassed if you go to the hospital with false labor. Sometimes, the only way to tell if you are in true labor is for your health care provider to look for changes in the cervix. The health care provider will do a physical exam and may monitor your contractions. If you are not in true labor, the exam should show that your cervix is not dilating and your water has not broken. If there are no other health problems associated with your pregnancy, it is completely safe for you to be sent home with false labor. You may continue to have Braxton Hicks contractions until you go into true labor. How to tell the difference between true labor and false labor True labor  Contractions last 30-70 seconds.  Contractions become very regular.  Discomfort is usually felt in the top of the uterus, and it spreads to the lower abdomen and low back.  Contractions do not go away with walking.  Contractions usually become more intense and increase in frequency.  The cervix dilates and gets thinner. False labor  Contractions are usually shorter and not as strong as true labor contractions.  Contractions are usually irregular.  Contractions  are often felt in the front of the lower abdomen and in the groin.  Contractions may go away when you walk around or change positions while lying down.  Contractions get weaker and are shorter-lasting as time goes on.  The cervix usually does not dilate or become thin. Follow these instructions at home:   Take over-the-counter and prescription medicines only as told by your health care provider.  Keep up with your usual exercises and follow other instructions from your health care provider.  Eat and drink lightly if you think you are going into labor.  If Braxton Hicks contractions are making you uncomfortable: ? Change your position from lying down or resting to walking, or change from walking to resting. ? Sit and rest in a tub of warm water. ? Drink enough fluid to keep your urine pale yellow. Dehydration may cause these contractions. ? Do slow and deep breathing several times an hour.  Keep all follow-up prenatal visits as told by your health care provider. This is important. Contact a health care provider if:  You have a fever.  You have continuous pain in your abdomen. Get help right away if:  Your contractions become stronger, more regular, and closer together.  You have fluid leaking or gushing from your vagina.  You pass blood-tinged mucus (bloody show).  You have bleeding from your vagina.  You have low back pain that you never had before.  You feel your baby's head pushing down and causing pelvic pressure.  Your baby is not moving inside you as much as it used to. Summary  Contractions that occur before labor are   called Braxton Hicks contractions, false labor, or practice contractions.  Braxton Hicks contractions are usually shorter, weaker, farther apart, and less regular than true labor contractions. True labor contractions usually become progressively stronger and regular, and they become more frequent.  Manage discomfort from Braxton Hicks contractions  by changing position, resting in a warm bath, drinking plenty of water, or practicing deep breathing. This information is not intended to replace advice given to you by your health care provider. Make sure you discuss any questions you have with your health care provider. Document Released: 11/24/2016 Document Revised: 04/25/2017 Document Reviewed: 11/24/2016 Elsevier Interactive Patient Education  2019 Elsevier Inc.  

## 2018-08-13 LAB — CULTURE, BETA STREP (GROUP B ONLY): Strep Gp B Culture: POSITIVE — AB

## 2018-08-17 ENCOUNTER — Encounter: Payer: Self-pay | Admitting: Advanced Practice Midwife

## 2018-08-17 ENCOUNTER — Encounter: Payer: BLUE CROSS/BLUE SHIELD | Admitting: Advanced Practice Midwife

## 2018-08-17 ENCOUNTER — Ambulatory Visit (INDEPENDENT_AMBULATORY_CARE_PROVIDER_SITE_OTHER): Payer: BLUE CROSS/BLUE SHIELD | Admitting: Advanced Practice Midwife

## 2018-08-17 VITALS — BP 102/70 | Wt 192.0 lb

## 2018-08-17 DIAGNOSIS — Z3403 Encounter for supervision of normal first pregnancy, third trimester: Secondary | ICD-10-CM

## 2018-08-17 DIAGNOSIS — Z3A37 37 weeks gestation of pregnancy: Secondary | ICD-10-CM

## 2018-08-17 LAB — POCT URINALYSIS DIPSTICK OB
Glucose, UA: NEGATIVE
POC,PROTEIN,UA: NEGATIVE

## 2018-08-17 NOTE — Progress Notes (Signed)
  Routine Prenatal Care Visit  Subjective  Jennifer Nelson is a 23 y.o. G1P0000 at [redacted]w[redacted]d being seen today for ongoing prenatal care.  She is currently monitored for the following issues for this low-risk pregnancy and has Supervision of normal first pregnancy, antepartum and Carpal tunnel syndrome during pregnancy on their problem list.  ----------------------------------------------------------------------------------- Patient reports no complaints.  Questions regarding GBS positive status answered.  Contractions: Not present. Vag. Bleeding: None.  Movement: Present. Denies leaking of fluid.  ----------------------------------------------------------------------------------- The following portions of the patient's history were reviewed and updated as appropriate: allergies, current medications, past family history, past medical history, past social history, past surgical history and problem list. Problem list updated.   Objective  Blood pressure 102/70, weight 192 lb (87.1 kg), last menstrual period 11/28/2017. Pregravid weight 156 lb (70.8 kg) Total Weight Gain 36 lb (16.3 kg) Urinalysis: Urine Protein    Urine Glucose    Fetal Status: Fetal Heart Rate (bpm): 123 Fundal Height: 36 cm Movement: Present     General:  Alert, oriented and cooperative. Patient is in no acute distress.  Skin: Skin is warm and dry. No rash noted.   Cardiovascular: Normal heart rate noted  Respiratory: Normal respiratory effort, no problems with respiration noted  Abdomen: Soft, gravid, appropriate for gestational age. Pain/Pressure: Absent     Pelvic:  Cervical exam deferred        Extremities: Normal range of motion.  Edema: None  Mental Status: Normal mood and affect. Normal behavior. Normal judgment and thought content.   Assessment   23 y.o. G1P0000 at [redacted]w[redacted]d by  09/04/2018, by Last Menstrual Period presenting for routine prenatal visit  Plan   Pregnancy#1 Problems (from 11/28/17 to present)    Problem Noted Resolved   Supervision of normal first pregnancy, antepartum 01/22/2018 by Tresea Mall, CNM No   Overview Addendum 07/13/2018  9:27 PM by Farrel Conners, CNM    [facility]inic [facility] Prenatal Labs  Dating LMP = [redacted]w[redacted]d [place] Blood type: O/Positive/-- (07/01 1053)   Genetic Screen Inheritest NIPS: diploid XY Antibody:Negative (07/01 1053)  Anatomic US WNL, anterior placenta Rubella: 3.17 (07/01 1053) Varicella: Immune  GTT 110 RPR: Non Reactive (07/01 1053)   Rhogam N/A HBsAg: Negative (07/01 1053)   TDaP vaccine 07/13/2018    Flu Shot:03/19/18 HIV: Non Reactive (07/01 1053)   Baby Food     bottle                           GBS:   Contraception Given information Pap: 07/03/2017 [facility]) [ ]  repeat postpartum  CBB     CS/VBAC NA   Support Person Husband [name]               Term labor symptoms and general obstetric precautions including but not limited to vaginal bleeding, contractions, leaking of fluid and fetal movement were reviewed in detail with the patient. Please refer to After Visit Summary for other counseling recommendations.   Return in about 1 week (around 08/24/2018) for rob.  Tresea Mall, CNM 08/17/2018 11:19 AM

## 2018-08-21 ENCOUNTER — Ambulatory Visit (INDEPENDENT_AMBULATORY_CARE_PROVIDER_SITE_OTHER): Payer: BLUE CROSS/BLUE SHIELD | Admitting: Maternal Newborn

## 2018-08-21 ENCOUNTER — Encounter: Payer: Self-pay | Admitting: Maternal Newborn

## 2018-08-21 VITALS — BP 122/70 | Wt 191.0 lb

## 2018-08-21 DIAGNOSIS — R51 Headache: Secondary | ICD-10-CM

## 2018-08-21 DIAGNOSIS — O219 Vomiting of pregnancy, unspecified: Secondary | ICD-10-CM

## 2018-08-21 DIAGNOSIS — O26893 Other specified pregnancy related conditions, third trimester: Secondary | ICD-10-CM

## 2018-08-21 DIAGNOSIS — O26899 Other specified pregnancy related conditions, unspecified trimester: Secondary | ICD-10-CM

## 2018-08-21 DIAGNOSIS — Z3400 Encounter for supervision of normal first pregnancy, unspecified trimester: Secondary | ICD-10-CM

## 2018-08-21 DIAGNOSIS — R519 Headache, unspecified: Secondary | ICD-10-CM

## 2018-08-21 DIAGNOSIS — Z3A38 38 weeks gestation of pregnancy: Secondary | ICD-10-CM

## 2018-08-21 LAB — POCT URINALYSIS DIPSTICK OB: Glucose, UA: NEGATIVE

## 2018-08-21 MED ORDER — PROCHLORPERAZINE MALEATE 10 MG PO TABS: 10.0000 mg | ORAL_TABLET | Freq: Four times a day (QID) | ORAL | 3 refills | Status: DC | PRN

## 2018-08-21 NOTE — Progress Notes (Signed)
ROB C/o Migraine, vomiting, nausea, diarrhea, and some cramping more on LLQ

## 2018-08-21 NOTE — Progress Notes (Signed)
Prenatal Problem Visit  Subjective  Jennifer Nelson is a 23 y.o. G1P0000 at [redacted]w[redacted]d being seen today for ongoing prenatal care.  She is currently monitored for the following issues for this low-risk pregnancy and has Supervision of normal first pregnancy, antepartum and Carpal tunnel syndrome during pregnancy on their problem list.  ----------------------------------------------------------------------------------- Patient reports that her headaches are becoming more frequent and Fioricet is not helping anymore. She does not have much of an appetite when she is having a headache, and when she tries to eat during an episode, she experiences nausea and vomiting. She noticed an increased in swelling/edema. She has been having looser than normal stools for 2 weeks. She had some contraction pains last night that kept her awake, but these have subsided. No visual changes or epigastric pain. Baby is moving well. Contractions: Irregular. Vag. Bleeding: None.  Movement: Present. No leaking of fluid.  ----------------------------------------------------------------------------------- The following portions of the patient's history were reviewed and updated as appropriate: allergies, current medications, past family history, past medical history, past social history, past surgical history and problem list. Problem list updated.   Objective  Blood pressure 122/70, weight 191 lb (86.6 kg), last menstrual period 11/28/2017. Pregravid weight 156 lb (70.8 kg) Total Weight Gain 35 lb (15.9 kg) Body mass index is 29.91 kg/m.   Urinalysis: Urine dipstick shows negative for glucose, positive for protein (trace).  Fetal Status: Fetal Heart Rate (bpm): 149 Fundal Height: 37 cm Movement: Present     General:  Alert, oriented and cooperative. Patient is in no acute distress.  Skin: Skin is warm and dry. No rash noted.   Cardiovascular: Normal heart rate noted  Respiratory: Normal respiratory effort, no problems  with respiration noted  Abdomen: Soft, gravid, appropriate for gestational age. Pain/Pressure: Absent     Pelvic:  Cervical exam deferred        Extremities: Normal range of motion.  Edema: Trace  Mental Status: Normal mood and affect. Normal behavior. Normal judgment and thought content.     Assessment   23 y.o. G1P0000 at [redacted]w[redacted]d, EDD 09/04/2018 by Last Menstrual Period presenting for a prenatal problem visit.  Plan   Pregnancy#1 Problems (from 11/28/17 to present)    Problem Noted Resolved   Supervision of normal first pregnancy, antepartum 01/22/2018 by Tresea Mall, CNM No   Overview Addendum 07/13/2018  9:27 PM by Farrel Conners, CNM    [facility] Prenatal Labs  Dating LMP = [redacted]w[redacted]d [place] Blood type: O/Positive/-- (07/01 1053)   Genetic Screen Inheritest NIPS: diploid XY Antibody:Negative (07/01 1053)  Anatomic US WNL, anterior placenta Rubella: 3.17 (07/01 1053) Varicella: Immune  GTT 110 RPR: Non Reactive (07/01 1053)   Rhogam N/A HBsAg: Negative (07/01 1053)   TDaP vaccine 07/13/2018    Flu Shot:03/19/18 HIV: Non Reactive (07/01 1053)   Baby Food     bottle                           GBS:   Contraception Given information Pap: 07/03/2017 [facility]) [ ]  repeat postpartum  CBB     CS/VBAC NA   Support Person Husband Montez Hageman today to rule out pre-eclampsia with worsening headaches. Normotensive at this visit. Advised to be seen in triage if other symptoms of pre-eclampsia develop.  Rx for Compazine to see if it will help with headaches/nausea.  Term labor symptoms and general  obstetric precautions including but not limited to vaginal bleeding, contractions, leaking of fluid and fetal movement were reviewed.  Return in about 1 week (around 08/28/2018) for ROB.  Marcelyn Bruins, CNM 08/21/2018

## 2018-08-22 LAB — CBC
Hematocrit: 31.7 % — ABNORMAL LOW (ref 34.0–46.6)
Hemoglobin: 10.8 g/dL — ABNORMAL LOW (ref 11.1–15.9)
MCH: 31.3 pg (ref 26.6–33.0)
MCHC: 34.1 g/dL (ref 31.5–35.7)
MCV: 92 fL (ref 79–97)
Platelets: 216 10*3/uL (ref 150–450)
RBC: 3.45 x10E6/uL — ABNORMAL LOW (ref 3.77–5.28)
RDW: 11.3 % — ABNORMAL LOW (ref 11.7–15.4)
WBC: 11.7 10*3/uL — ABNORMAL HIGH (ref 3.4–10.8)

## 2018-08-22 LAB — COMPREHENSIVE METABOLIC PANEL
ALT: 9 IU/L (ref 0–32)
AST: 16 IU/L (ref 0–40)
Albumin/Globulin Ratio: 1.4 (ref 1.2–2.2)
Albumin: 3.4 g/dL — ABNORMAL LOW (ref 3.9–5.0)
Alkaline Phosphatase: 112 IU/L (ref 39–117)
BUN/Creatinine Ratio: 4 — ABNORMAL LOW (ref 9–23)
BUN: 2 mg/dL — ABNORMAL LOW (ref 6–20)
Bilirubin Total: 0.3 mg/dL (ref 0.0–1.2)
CO2: 19 mmol/L — ABNORMAL LOW (ref 20–29)
Calcium: 8.4 mg/dL — ABNORMAL LOW (ref 8.7–10.2)
Chloride: 103 mmol/L (ref 96–106)
Creatinine, Ser: 0.5 mg/dL — ABNORMAL LOW (ref 0.57–1.00)
GFR calc Af Amer: 156 mL/min/{1.73_m2} (ref >59)
GFR calc non Af Amer: 135 mL/min/{1.73_m2} (ref >59)
Globulin, Total: 2.4 g/dL (ref 1.5–4.5)
Glucose: 73 mg/dL (ref 65–99)
Potassium: 3.5 mmol/L (ref 3.5–5.2)
Sodium: 137 mmol/L (ref 134–144)
Total Protein: 5.8 g/dL — ABNORMAL LOW (ref 6.0–8.5)

## 2018-08-22 LAB — PROTEIN / CREATININE RATIO, URINE
Creatinine, Urine: 112.8 mg/dL
Protein, Ur: 24.3 mg/dL
Protein/Creat Ratio: 215 mg/g creat — ABNORMAL HIGH (ref 0–200)

## 2018-08-24 ENCOUNTER — Encounter: Payer: BLUE CROSS/BLUE SHIELD | Admitting: Obstetrics and Gynecology

## 2018-08-28 ENCOUNTER — Ambulatory Visit (INDEPENDENT_AMBULATORY_CARE_PROVIDER_SITE_OTHER): Payer: BLUE CROSS/BLUE SHIELD | Admitting: Obstetrics and Gynecology

## 2018-08-28 VITALS — BP 125/68 | Wt 200.0 lb

## 2018-08-28 DIAGNOSIS — Z3400 Encounter for supervision of normal first pregnancy, unspecified trimester: Secondary | ICD-10-CM

## 2018-08-28 DIAGNOSIS — O99820 Streptococcus B carrier state complicating pregnancy: Secondary | ICD-10-CM

## 2018-08-28 DIAGNOSIS — B951 Streptococcus, group B, as the cause of diseases classified elsewhere: Secondary | ICD-10-CM

## 2018-08-28 DIAGNOSIS — Z3A39 39 weeks gestation of pregnancy: Secondary | ICD-10-CM

## 2018-08-28 LAB — POCT URINALYSIS DIPSTICK OB
Glucose, UA: NEGATIVE
POC,PROTEIN,UA: NEGATIVE

## 2018-08-28 NOTE — Progress Notes (Signed)
    Routine Prenatal Care Visit  Subjective  Jennifer Nelson is a 23 y.o. G1P0000 at [redacted]w[redacted]d being seen today for ongoing prenatal care.  She is currently monitored for the following issues for this low-risk pregnancy and has Supervision of normal first pregnancy, antepartum; Carpal tunnel syndrome during pregnancy; and Positive GBS test on their problem list.  ----------------------------------------------------------------------------------- Patient reports no complaints.   Contractions: Irregular. Vag. Bleeding: None.  Movement: Present. Denies leaking of fluid.  ----------------------------------------------------------------------------------- The following portions of the patient's history were reviewed and updated as appropriate: allergies, current medications, past family history, past medical history, past social history, past surgical history and problem list. Problem list updated.   Objective  Blood pressure 125/68, weight 200 lb (90.7 kg), last menstrual period 11/28/2017. Pregravid weight 156 lb (70.8 kg) Total Weight Gain 44 lb (20 kg) Urinalysis:      Fetal Status: Fetal Heart Rate (bpm): 138 Fundal Height: 38 cm Movement: Present  Presentation: Vertex  General:  Alert, oriented and cooperative. Patient is in no acute distress.  Skin: Skin is warm and dry. No rash noted.   Cardiovascular: Normal heart rate noted  Respiratory: Normal respiratory effort, no problems with respiration noted  Abdomen: Soft, gravid, appropriate for gestational age. Pain/Pressure: Absent     Pelvic:  Cervical exam performed Dilation: 1.5 Effacement (%): 70 Station: -3  Extremities: Normal range of motion.  Edema: Trace  ental Status: Normal mood and affect. Normal behavior. Normal judgment and thought content.     Assessment   23 y.o. G1P0000 at [redacted]w[redacted]d by  09/04/2018, by Last Menstrual Period presenting for routine prenatal visit  Plan   Pregnancy#1 Problems (from 11/28/17 to present)    Problem Noted Resolved   Supervision of normal first pregnancy, antepartum 01/22/2018 by Tresea Mall, CNM No   Overview Addendum 07/13/2018  9:27 PM by Farrel Conners, CNM    [facility] Prenatal Labs  Dating LMP = [redacted]w[redacted]d [place] Blood type: O/Positive/-- (07/01 1053)   Genetic Screen Inheritest NIPS: diploid XY Antibody:Negative (07/01 1053)  Anatomic US WNL, anterior placenta Rubella: 3.17 (07/01 1053) Varicella: Immune  GTT 110 RPR: Non Reactive (07/01 1053)   Rhogam N/A HBsAg: Negative (07/01 1053)   TDaP vaccine 07/13/2018    Flu Shot:03/19/18 HIV: Non Reactive (07/01 1053)   Baby Food     bottle                           GBS: positive  Contraception Given information Pap: 07/03/2017 [facility]) [ ]  repeat postpartum  CBB     CS/VBAC NA   Support Person Husband Jennifer Nelson               Gestational age appropriate obstetric precautions including but not limited to vaginal bleeding, contractions, leaking of fluid and fetal movement were reviewed in detail with the patient.    Return in about 1 week (around 09/04/2018) for ROB.  Vena Austria, MD, [facility], [facility] 08/28/2018, 3:46 PM

## 2018-08-28 NOTE — Progress Notes (Signed)
ROB Cramping

## 2018-09-03 ENCOUNTER — Observation Stay
Admission: EM | Admit: 2018-09-03 | Discharge: 2018-09-03 | Disposition: A | Discharge status: Home / Self Care | Payer: BLUE CROSS/BLUE SHIELD | Attending: Obstetrics and Gynecology | Admitting: Obstetrics and Gynecology

## 2018-09-03 ENCOUNTER — Other Ambulatory Visit: Payer: Self-pay

## 2018-09-03 DIAGNOSIS — O471 False labor at or after 37 completed weeks of gestation: Secondary | ICD-10-CM

## 2018-09-03 DIAGNOSIS — Z3A39 39 weeks gestation of pregnancy: Secondary | ICD-10-CM | POA: Insufficient documentation

## 2018-09-03 DIAGNOSIS — O479 False labor, unspecified: Secondary | ICD-10-CM

## 2018-09-03 MED ORDER — ACETAMINOPHEN 325 MG PO TABS: 650.0000 mg | ORAL_TABLET | ORAL | Status: DC | PRN

## 2018-09-03 NOTE — OB Triage Note (Signed)
Patient arrived from ED with complaints of contractions for past seven hours.  Patient states contractions have gone from every 10 minutes to every 6 minutes.  Denies vaginal bleeding or leaking fluid.  Reports good fetal movement.

## 2018-09-03 NOTE — Discharge Summary (Signed)
Physician Final Progress Note  Patient ID: Jennifer Nelson MRN: [id number] DOB/AGE: 11-03-1995 23 y.o.  Admit date: 09/03/2018 Admitting provider: Vena Austria, MD Discharge date: 09/03/2018   Admission Diagnoses: Contractions  Discharge Diagnoses:  Active Problems:   Braxton Hick's contraction  23 year old G1P0000 at [redacted]w[redacted]d presenting with contractions starting this morning.  No VB, no LOF, +FM.  Cervix unchanged from clinic irregular contraction pattern on tocometer.  Normotensive BP.  Discharge home with routine labor precautions.  Consults: None  Significant Findings/ Diagnostic Studies: non  Procedures:  Baseline: 140 Variability: moderate Accelerations: present Decelerations: absent Tocometry: irregular, every 6-8 minutes The patient was monitored for 30 minutes, fetal heart rate tracing was deemed reactive, category I tracing,  Discharge Condition: good  Disposition: Discharge disposition: 01-Home or Self Care       Diet: Regular diet  Discharge Activity: Activity as tolerated  Discharge Instructions    Discharge activity:  No Restrictions   Complete by:  As directed    Discharge diet:  No restrictions   Complete by:  As directed    Fetal Kick Count:  Lie on our left side for one hour after a meal, and count the number of times your baby kicks.  If it is less than 5 times, get up, move around and drink some juice.  Repeat the test 30 minutes later.  If it is still less than 5 kicks in an hour, notify your doctor.   Complete by:  As directed    LABOR:  When conractions begin, you should start to time them from the beginning of one contraction to the beginning  of the next.  When contractions are 5 - 10 minutes apart or less and have been regular for at least an hour, you should call your health care provider.   Complete by:  As directed    No sexual activity restrictions   Complete by:  As directed    Notify physician for bleeding from the vagina    Complete by:  As directed    Notify physician for blurring of vision or spots before the eyes   Complete by:  As directed    Notify physician for chills or fever   Complete by:  As directed    Notify physician for fainting spells, "black outs" or loss of consciousness   Complete by:  As directed    Notify physician for increase in vaginal discharge   Complete by:  As directed    Notify physician for leaking of fluid   Complete by:  As directed    Notify physician for pain or burning when urinating   Complete by:  As directed    Notify physician for pelvic pressure (sudden increase)   Complete by:  As directed    Notify physician for severe or continued nausea or vomiting   Complete by:  As directed    Notify physician for sudden gushing of fluid from the vagina (with or without continued leaking)   Complete by:  As directed    Notify physician for sudden, constant, or occasional abdominal pain   Complete by:  As directed    Notify physician if baby moving less than usual   Complete by:  As directed      Allergies as of 09/03/2018      Reactions   Nexium [esomeprazole Magnesium] Nausea And Vomiting      Medication List    STOP taking these medications   amoxicillin-clavulanate 875-125 MG tablet  Commonly known as:  AUGMENTIN   Butalbital-APAP-Caffeine 50-325-40 MG capsule   prochlorperazine 10 MG tablet Commonly known as:  COMPAZINE     TAKE these medications   BONJESTA 20-20 MG Tbcr Generic drug:  Doxylamine-Pyridoxine ER Bonjesta 20 mg-20 mg tablet,immediate and delay release   prenatal multivitamin Tabs tablet Take 1 tablet by mouth daily at 12 noon.   PROCTOZONE-HC 2.5 % rectal cream Generic drug:  hydrocortisone Proctozone-HC 2.5 % topical cream perineal applicator  APPLY UTD BID FOR 1 MONTH        Total time spent taking care of this patient: Triaged via phone  Signed: Vena Austriandreas Lindon Kiel 09/03/2018, 10:49 AM

## 2018-09-05 ENCOUNTER — Ambulatory Visit (INDEPENDENT_AMBULATORY_CARE_PROVIDER_SITE_OTHER): Payer: BC Managed Care – PPO | Admitting: Obstetrics and Gynecology

## 2018-09-05 VITALS — BP 130/80 | Wt 193.0 lb

## 2018-09-05 DIAGNOSIS — Z3A40 40 weeks gestation of pregnancy: Secondary | ICD-10-CM

## 2018-09-05 DIAGNOSIS — Z3400 Encounter for supervision of normal first pregnancy, unspecified trimester: Secondary | ICD-10-CM

## 2018-09-05 DIAGNOSIS — B951 Streptococcus, group B, as the cause of diseases classified elsewhere: Secondary | ICD-10-CM

## 2018-09-05 DIAGNOSIS — O480 Post-term pregnancy: Secondary | ICD-10-CM

## 2018-09-05 DIAGNOSIS — O99820 Streptococcus B carrier state complicating pregnancy: Secondary | ICD-10-CM

## 2018-09-05 NOTE — Progress Notes (Signed)
  [facility] B[facility]THPL[facility] INDUCTION ASSESSMENT Jennifer Nelson 09-Jul-1996 Medical record #: [id number] Phone #:  Home Phone [phone]  Mobile [phone]    [facility]renatal Provider:[facility] Proposed admission date/time:09/10/2018 0800 Method of induction:Pitocin  Weight: Filed Weights02/12/20 1130Weight:193 lb (87.5 kg) BMI Body mass index is 30.23 kg/m. HIV Negative HSV Negative EDC Estimated Date of Delivery: 2/11/20based on:LMP  Gestational age on admission: [redacted]w[redacted]d Gravidity/parity:G1P0000  Cervix Score   0 1 2 3   Position Posterior Midposition Anterior   Consistency Firm Medium Soft   Effacement (%) 0-30 40-50 60-70 >80  Dilation (cm) Closed 1-2 3-4 >5  Baby's station -3 -2 -1 +1, +2   Bishop Score:7   Medical induction of labor  select indication(s) below Elective induction ?39 weeks multiparous patient ?39 weeks primiparous patient with Bishop score ?7 ?40 weeks primiparous patient   Medical Indications Adapted from ACOG Committee Opinion #560, "Medically Indicated Late Preterm and Early Term Deliveries," 2013.  PLACENTAL / UTERINE ISSUES FETAL ISSUES MATERNAL ISSUES  ? Placenta previa (36.0-37.6) ? Isoimmunization (37.0-38.6) ? Preeclampsia without severe features or gestational HTN (37.0)  ? Suspected accreta (34.0-35.6) ? Growth Restriction Mason Jim) ? Preeclampsia with severe features (34.0)  ? Prior classical CD, uterine window, rupture (36.0-37.6) ? Isolated (38.0-39.6) ? Chronic HTN (38.0-39.6)  ? Prior myomectomy (37.0-38.6) ? Concurrent findings (34.0-37.6) ? Cholestasis (37.0)  ? Umbilical vein varix (37.0) ? Growth Restriction (Twins) ? Diabetes  ? Placental abruption (chronic) ? Di-Di Isolated (36.0-37.6) ? Pregestational, controlled (39.0)  OBSTETRIC ISSUES ? Di-Di concurrent findings (32.0-34.6) ? Pregestational, uncontrolled (37.0-39.0)  ? Postdates ? (41 weeks) ? Mo-Di isolated (32.0-34.6)  ? Pregestational, vascular compromise (37.0- 39.0)  ? PPROM (34.0) ? Multiple Gestation ? Gestational, diet controlled (40.0)  ? Hx of IUFD (39.0 weeks) ? Di-Di (38.0-38.6) ? Gestational, med controlled (39.0)  ? Polyhydramnios, mild/moderate; SDV 8-16 or AFI 25-35 (39.0) ? Mo-Di (36.0-37.6) ? Gestational, uncontrolled (38.0-39.0)  ? Oligohydramnios (36.0-37.6); MVP <2 cm  For indications not listed above, delivery recommendations from maternal-fetal medicine consultant occurred on: N/A  Provider Signature: Vena Austria Scheduled by:09/06/2015 Date:09/05/2018 11:49 AM   Call [phone] to finalize the induction date/time  [id number] (07/17)

## 2018-09-05 NOTE — Progress Notes (Signed)
    Routine Prenatal Care Visit  Subjective  Jennifer Nelson is a 23 y.o. G1P0000 at [redacted]w[redacted]d being seen today for ongoing prenatal care.  She is currently monitored for the following issues for this low-risk pregnancy and has Supervision of normal first pregnancy, antepartum; Carpal tunnel syndrome during pregnancy; Positive GBS test; Braxton Hick's contraction; and Labor and delivery indication for care or intervention on their problem list.  ----------------------------------------------------------------------------------- Patient reports no complaints.   Contractions: Not present. Vag. Bleeding: None.  Movement: Present. Denies leaking of fluid.  ----------------------------------------------------------------------------------- The following portions of the patient's history were reviewed and updated as appropriate: allergies, current medications, past family history, past medical history, past social history, past surgical history and problem list. Problem list updated.   Objective  Blood pressure 130/80, weight 193 lb (87.5 kg), last menstrual period 11/28/2017. Pregravid weight 156 lb (70.8 kg) Total Weight Gain 37 lb (16.8 kg) Urinalysis:      Fetal Status: Fetal Heart Rate (bpm): 145 Fundal Height: 39 cm Movement: Present  Presentation: Vertex  General:  Alert, oriented and cooperative. Patient is in no acute distress.  Skin: Skin is warm and dry. No rash noted.   Cardiovascular: Normal heart rate noted  Respiratory: Normal respiratory effort, no problems with respiration noted  Abdomen: Soft, gravid, appropriate for gestational age. Pain/Pressure: Present     Pelvic:  Cervical exam performed Dilation: 3 Effacement (%): 70 Station: -2  Extremities: Normal range of motion.     ental Status: Normal mood and affect. Normal behavior. Normal judgment and thought content.     Assessment   23 y.o. G1P0000 at [redacted]w[redacted]d by  09/04/2018, by Last Menstrual Period presenting for routine  prenatal visit  Plan   Pregnancy#1 Problems (from 11/28/17 to present)    Problem Noted Resolved   Positive GBS test 08/28/2018 by Vena Austria, MD No   Supervision of normal first pregnancy, antepartum 01/22/2018 by Tresea Mall, CNM No   Overview Addendum 07/13/2018  9:27 PM by Farrel Conners, CNM    [facility]inic [facility] Prenatal Labs  Dating LMP = 101w4d [place] Blood type: O/Positive/-- (07/01 1053)   Genetic Screen Inheritest NIPS: diploid XY Antibody:Negative (07/01 1053)  Anatomic US WNL, anterior placenta Rubella: 3.17 (07/01 1053) Varicella: Immune  GTT 110 RPR: Non Reactive (07/01 1053)   Rhogam N/A HBsAg: Negative (07/01 1053)   TDaP vaccine 07/13/2018    Flu Shot:03/19/18 HIV: Non Reactive (07/01 1053)   Baby Food     bottle                           GBS:   Contraception Given information Pap: 07/03/2017 [facility]) [ ]  repeat postpartum  CBB     CS/VBAC NA   Support Person Husband [name]               Gestational age appropriate obstetric precautions including but not limited to vaginal bleeding, contractions, leaking of fluid and fetal movement were reviewed in detail with the patient.    - IOL 08/10/2018  Return if symptoms worsen or fail to improve.  Vena Austria, MD, [facility], [facility] 09/05/2018, 11:48 AM

## 2018-09-10 ENCOUNTER — Inpatient Hospital Stay
Admission: RE | Admit: 2018-09-10 | Discharge: 2018-09-12 | DRG: 806 | Disposition: A | Discharge status: Home / Self Care | Payer: BC Managed Care – PPO | Attending: Obstetrics & Gynecology | Admitting: Obstetrics & Gynecology

## 2018-09-10 ENCOUNTER — Inpatient Hospital Stay: Payer: BC Managed Care – PPO | Admitting: Anesthesiology

## 2018-09-10 ENCOUNTER — Other Ambulatory Visit: Payer: Self-pay

## 2018-09-10 DIAGNOSIS — O9081 Anemia of the puerperium: Secondary | ICD-10-CM | POA: Diagnosis not present

## 2018-09-10 DIAGNOSIS — O99824 Streptococcus B carrier state complicating childbirth: Secondary | ICD-10-CM | POA: Diagnosis present

## 2018-09-10 DIAGNOSIS — O480 Post-term pregnancy: Secondary | ICD-10-CM | POA: Diagnosis present

## 2018-09-10 DIAGNOSIS — Z8759 Personal history of other complications of pregnancy, childbirth and the puerperium: Secondary | ICD-10-CM

## 2018-09-10 DIAGNOSIS — D62 Acute posthemorrhagic anemia: Secondary | ICD-10-CM | POA: Diagnosis not present

## 2018-09-10 DIAGNOSIS — Z3490 Encounter for supervision of normal pregnancy, unspecified, unspecified trimester: Secondary | ICD-10-CM | POA: Diagnosis present

## 2018-09-10 DIAGNOSIS — Z3400 Encounter for supervision of normal first pregnancy, unspecified trimester: Secondary | ICD-10-CM

## 2018-09-10 DIAGNOSIS — B951 Streptococcus, group B, as the cause of diseases classified elsewhere: Secondary | ICD-10-CM

## 2018-09-10 DIAGNOSIS — Z3A40 40 weeks gestation of pregnancy: Secondary | ICD-10-CM

## 2018-09-10 LAB — CBC
HCT: 36 % (ref 36.0–46.0)
Hemoglobin: 12.3 g/dL (ref 12.0–15.0)
MCH: 31.5 pg (ref 26.0–34.0)
MCHC: 34.2 g/dL (ref 30.0–36.0)
MCV: 92.3 fL (ref 80.0–100.0)
Platelets: 206 10*3/uL (ref 150–400)
RBC: 3.9 MIL/uL (ref 3.87–5.11)
RDW: 11.6 % (ref 11.5–15.5)
WBC: 9.4 10*3/uL (ref 4.0–10.5)
nRBC: 0 % (ref 0.0–0.2)

## 2018-09-10 LAB — TYPE AND SCREEN
ABO/RH(D): O POS
Antibody Screen: NEGATIVE

## 2018-09-10 MED ORDER — MISOPROSTOL 200 MCG PO TABS
ORAL_TABLET | ORAL | Status: AC
  Administered 2018-09-10: 800 ug
  Filled 2018-09-10: qty 4

## 2018-09-10 MED ORDER — ONDANSETRON HCL 4 MG/2ML IJ SOLN: 4.0000 mg | INTRAMUSCULAR | Status: DC | PRN

## 2018-09-10 MED ORDER — TERBUTALINE SULFATE 1 MG/ML IJ SOLN: 0.2500 mg | Freq: Once | INTRAMUSCULAR | Status: DC | PRN

## 2018-09-10 MED ORDER — SODIUM CHLORIDE 0.9% FLUSH: 3.0000 mL | Freq: Two times a day (BID) | INTRAVENOUS | Status: DC

## 2018-09-10 MED ORDER — LACTATED RINGERS IV SOLN
500.0000 mL | INTRAVENOUS | Status: DC | PRN
  Administered 2018-09-10: 500 mL via INTRAVENOUS

## 2018-09-10 MED ORDER — LIDOCAINE HCL (PF) 1 % IJ SOLN: 30.0000 mL | INTRAMUSCULAR | Status: DC | PRN

## 2018-09-10 MED ORDER — SOD CITRATE-CITRIC ACID 500-334 MG/5ML PO SOLN: 30.0000 mL | ORAL | Status: DC | PRN

## 2018-09-10 MED ORDER — COCONUT OIL OIL: 1.0000 "application " | TOPICAL_OIL | Status: DC | PRN

## 2018-09-10 MED ORDER — DIPHENHYDRAMINE HCL 25 MG PO CAPS: 25.0000 mg | ORAL_CAPSULE | Freq: Four times a day (QID) | ORAL | Status: DC | PRN

## 2018-09-10 MED ORDER — BENZOCAINE-MENTHOL 20-0.5 % EX AERO
1.0000 "application " | INHALATION_SPRAY | CUTANEOUS | Status: DC | PRN
  Administered 2018-09-10: 1 via TOPICAL

## 2018-09-10 MED ORDER — IBUPROFEN 600 MG PO TABS
600.0000 mg | ORAL_TABLET | Freq: Four times a day (QID) | ORAL | Status: DC
  Administered 2018-09-10 – 2018-09-11 (×4): 600 mg via ORAL
  Filled 2018-09-10 (×6): qty 1

## 2018-09-10 MED ORDER — OXYTOCIN 40 UNITS IN NORMAL SALINE INFUSION - SIMPLE MED
1.0000 m[IU]/min | INTRAVENOUS | Status: DC
  Administered 2018-09-10: 2 m[IU]/min via INTRAVENOUS
  Filled 2018-09-10: qty 1000

## 2018-09-10 MED ORDER — LIDOCAINE-EPINEPHRINE (PF) 1.5 %-1:200000 IJ SOLN
INTRAMUSCULAR | Status: DC | PRN
  Administered 2018-09-10: 3 mL via EPIDURAL

## 2018-09-10 MED ORDER — PENICILLIN G 3 MILLION UNITS IVPB - SIMPLE MED
3.0000 10*6.[IU] | INTRAVENOUS | Status: DC
  Administered 2018-09-10 (×2): 3 10*6.[IU] via INTRAVENOUS
  Filled 2018-09-10 (×7): qty 100
  Filled 2018-09-10: qty 3

## 2018-09-10 MED ORDER — FENTANYL 2.5 MCG/ML W/ROPIVACAINE 0.15% IN NS 100 ML EPIDURAL (ARMC)
EPIDURAL | Status: AC
  Filled 2018-09-10: qty 100

## 2018-09-10 MED ORDER — ZOLPIDEM TARTRATE 5 MG PO TABS: 5.0000 mg | ORAL_TABLET | Freq: Every evening | ORAL | Status: DC | PRN

## 2018-09-10 MED ORDER — FENTANYL 2.5 MCG/ML W/ROPIVACAINE 0.15% IN NS 100 ML EPIDURAL (ARMC)
EPIDURAL | Status: DC | PRN
  Administered 2018-09-10: 12 mL/h via EPIDURAL

## 2018-09-10 MED ORDER — SENNOSIDES-DOCUSATE SODIUM 8.6-50 MG PO TABS
2.0000 | ORAL_TABLET | ORAL | Status: DC
  Administered 2018-09-11: 2 via ORAL
  Filled 2018-09-10: qty 2

## 2018-09-10 MED ORDER — SODIUM CHLORIDE 0.9 % IV SOLN: 250.0000 mL | INTRAVENOUS | Status: DC | PRN

## 2018-09-10 MED ORDER — LIDOCAINE HCL (PF) 1 % IJ SOLN
INTRAMUSCULAR | Status: DC | PRN
  Administered 2018-09-10: 3 mL via SUBCUTANEOUS

## 2018-09-10 MED ORDER — OXYCODONE-ACETAMINOPHEN 5-325 MG PO TABS: 2.0000 | ORAL_TABLET | ORAL | Status: DC | PRN

## 2018-09-10 MED ORDER — SIMETHICONE 80 MG PO CHEW: 80.0000 mg | CHEWABLE_TABLET | ORAL | Status: DC | PRN

## 2018-09-10 MED ORDER — ACETAMINOPHEN 325 MG PO TABS: 650.0000 mg | ORAL_TABLET | ORAL | Status: DC | PRN

## 2018-09-10 MED ORDER — OXYCODONE-ACETAMINOPHEN 5-325 MG PO TABS: 1.0000 | ORAL_TABLET | ORAL | Status: DC | PRN

## 2018-09-10 MED ORDER — ONDANSETRON HCL 4 MG PO TABS: 4.0000 mg | ORAL_TABLET | ORAL | Status: DC | PRN

## 2018-09-10 MED ORDER — OXYTOCIN 10 UNIT/ML IJ SOLN
INTRAMUSCULAR | Status: AC
  Filled 2018-09-10: qty 2

## 2018-09-10 MED ORDER — OXYTOCIN BOLUS FROM INFUSION
500.0000 mL | Freq: Once | INTRAVENOUS | Status: AC
  Administered 2018-09-10: 500 mL via INTRAVENOUS

## 2018-09-10 MED ORDER — SODIUM CHLORIDE 0.9% FLUSH: 3.0000 mL | INTRAVENOUS | Status: DC | PRN

## 2018-09-10 MED ORDER — DIBUCAINE 1 % RE OINT: 1.0000 "application " | TOPICAL_OINTMENT | RECTAL | Status: DC | PRN

## 2018-09-10 MED ORDER — ACETAMINOPHEN 325 MG PO TABS
650.0000 mg | ORAL_TABLET | ORAL | Status: DC | PRN
  Administered 2018-09-11 (×3): 650 mg via ORAL
  Filled 2018-09-10 (×4): qty 2

## 2018-09-10 MED ORDER — ONDANSETRON HCL 4 MG/2ML IJ SOLN: 4.0000 mg | Freq: Four times a day (QID) | INTRAMUSCULAR | Status: DC | PRN

## 2018-09-10 MED ORDER — BUPIVACAINE HCL (PF) 0.25 % IJ SOLN
INTRAMUSCULAR | Status: DC | PRN
  Administered 2018-09-10: 5 mL via EPIDURAL
  Administered 2018-09-10: 3 mL via EPIDURAL

## 2018-09-10 MED ORDER — LACTATED RINGERS IV SOLN
INTRAVENOUS | Status: DC
  Administered 2018-09-10: 09:00:00 via INTRAVENOUS

## 2018-09-10 MED ORDER — BENZOCAINE-MENTHOL 20-0.5 % EX AERO
INHALATION_SPRAY | CUTANEOUS | Status: AC
  Filled 2018-09-10: qty 56

## 2018-09-10 MED ORDER — LIDOCAINE HCL (PF) 1 % IJ SOLN
INTRAMUSCULAR | Status: AC
  Filled 2018-09-10: qty 30

## 2018-09-10 MED ORDER — AMMONIA AROMATIC IN INHA
RESPIRATORY_TRACT | Status: AC
  Filled 2018-09-10: qty 10

## 2018-09-10 MED ORDER — SODIUM CHLORIDE 0.9 % IV SOLN
5.0000 10*6.[IU] | Freq: Once | INTRAVENOUS | Status: AC
  Administered 2018-09-10: 5 10*6.[IU] via INTRAVENOUS
  Filled 2018-09-10: qty 5

## 2018-09-10 MED ORDER — OXYTOCIN 40 UNITS IN NORMAL SALINE INFUSION - SIMPLE MED
2.5000 [IU]/h | INTRAVENOUS | Status: DC
  Administered 2018-09-10: 2.5 [IU]/h via INTRAVENOUS

## 2018-09-10 MED ORDER — WITCH HAZEL-GLYCERIN EX PADS: 1.0000 "application " | MEDICATED_PAD | CUTANEOUS | Status: DC | PRN

## 2018-09-10 NOTE — Anesthesia Preprocedure Evaluation (Signed)
Anesthesia Evaluation  Patient identified by MRN, date of birth, ID band Patient awake    Reviewed: Allergy & Precautions, H&P , NPO status , Patient's Chart, lab work & pertinent test results  History of Anesthesia Complications Negative for: history of anesthetic complications  Airway Mallampati: II       Dental no notable dental hx.    Pulmonary           Cardiovascular      Neuro/Psych  Neuromuscular disease    GI/Hepatic   Endo/Other    Renal/GU      Musculoskeletal   Abdominal   Peds  Hematology   Anesthesia Other Findings   Reproductive/Obstetrics (+) Pregnancy                             Anesthesia Physical Anesthesia Plan  ASA: II  Anesthesia Plan: Epidural   Post-op Pain Management:    Induction:   PONV Risk Score and Plan:   Airway Management Planned:   Additional Equipment:   Intra-op Plan:   Post-operative Plan:   Informed Consent: I have reviewed the patients History and Physical, chart, labs and discussed the procedure including the risks, benefits and alternatives for the proposed anesthesia with the patient or authorized representative who has indicated his/her understanding and acceptance.       Plan Discussed with:   Anesthesia Plan Comments:         Anesthesia Quick Evaluation

## 2018-09-10 NOTE — Progress Notes (Signed)
  Labor Progress Note   23 y.o. G1P0000 @ [redacted]w[redacted]d , admitted for  Pregnancy, Labor Management. Induction of Labor: Postdates  Subjective:  Comfortable, rating contraction pain as 1/10.  Objective:  BP 131/76   Pulse 79   Temp 98 F (36.7 C) (Oral)   Resp 16   Ht 5\' 7"  (1.702 m)   Wt 88 kg   LMP 11/28/2017 (Exact Date)   SpO2 97%   BMI 30.38 kg/m  Abd: gravid, non-tender Extr: trace to 1+ bilateral pedal edema SVE: 4/70/-2, AROM with moderate amount of clear fluid  EFM: FHR: 130 bpm, variability: moderate,  accelerations:  Present,  decelerations:  Absent Toco: Frequency: Every 2-3.5 minutes, Duration: 50-90 seconds and Intensity: mild Labs: I have reviewed the patient's lab results.   Assessment & Plan:  G1P0000 @ [redacted]w[redacted]d, admitted for  Pregnancy and Labor/Delivery Management, Induction of Labor.  1. Pain management: none, patient's choice when ready 2. FWB: FHT category I.  3. ID: GBS positive, Continue scheduled antibiotics 4. Labor management: Continue to titrate Pitocin.  All discussed with patient.  Marcelyn Bruins, CNM 09/10/2018  12:39 PM

## 2018-09-10 NOTE — Plan of Care (Signed)
  Problem: Education: Goal: Knowledge of General Education information will improve Description Including pain rating scale, medication(s)/side effects and non-pharmacologic comfort measures Outcome: Progressing   Problem: Education: Goal: Knowledge of condition will improve Outcome: Progressing   Problem: Activity: Goal: Ability to tolerate increased activity will improve Outcome: Progressing

## 2018-09-10 NOTE — Discharge Instructions (Signed)
Vaginal Delivery, Care After °Refer to this sheet in the next few weeks. These instructions provide you with information about caring for yourself after vaginal delivery. Your health care provider may also give you more specific instructions. Your treatment has been planned according to current medical practices, but problems sometimes occur. Call your health care provider if you have any problems or questions. °What can I expect after the procedure? °After vaginal delivery, it is common to have: °· Some bleeding from your vagina. °· Soreness in your abdomen, your vagina, and the area of skin between your vaginal opening and your anus (perineum). °· Pelvic cramps. °· Fatigue. °Follow these instructions at home: °Medicines °· Take over-the-counter and prescription medicines only as told by your health care provider. °· If you were prescribed an antibiotic medicine, take it as told by your health care provider. Do not stop taking the antibiotic until it is finished. °Driving ° °· Do not drive or operate heavy machinery while taking prescription pain medicine. °· Do not drive for 24 hours if you received a sedative. °Lifestyle °· Do not drink alcohol. This is especially important if you are breastfeeding or taking medicine to relieve pain. °· Do not use tobacco products, including cigarettes, chewing tobacco, or e-cigarettes. If you need help quitting, ask your health care provider. °Eating and drinking °· Drink at least 8 eight-ounce glasses of water every day unless you are told not to by your health care provider. If you choose to breastfeed your baby, you may need to drink more water than this. °· Eat high-fiber foods every day. These foods may help prevent or relieve constipation. High-fiber foods include: °? Whole grain cereals and breads. °? Brown rice. °? Beans. °? Fresh fruits and vegetables. °Activity °· Return to your normal activities as told by your health care provider. Ask your health care provider what  activities are safe for you. °· Rest as much as possible. Try to rest or take a nap when your baby is sleeping. °· Do not lift anything that is heavier than your baby or 10 lb (4.5 kg) until your health care provider says that it is safe. °· Talk with your health care provider about when you can engage in sexual activity. This may depend on your: °? Risk of infection. °? Rate of healing. °? Comfort and desire to engage in sexual activity. °Vaginal Care °· If you have an episiotomy or a vaginal tear, check the area every day for signs of infection. Check for: °? More redness, swelling, or pain. °? More fluid or blood. °? Warmth. °? Pus or a bad smell. °· Do not use tampons or douches until your health care provider says this is safe. °· Watch for any blood clots that may pass from your vagina. These may look like clumps of dark red, brown, or black discharge. °General instructions °· Keep your perineum clean and dry as told by your health care provider. °· Wear loose, comfortable clothing. °· Wipe from front to back when you use the toilet. °· Ask your health care provider if you can shower or take a bath. If you had an episiotomy or a perineal tear during labor and delivery, your health care provider may tell you not to take baths for a certain length of time. °· Wear a bra that supports your breasts and fits you well. °· If possible, have someone help you with household activities and help care for your baby for at least a few days after you   leave the hospital. °· Keep all follow-up visits for you and your baby as told by your health care provider. This is important. °Contact a health care provider if: °· You have: °? Vaginal discharge that has a bad smell. °? Difficulty urinating. °? Pain when urinating. °? A sudden increase or decrease in the frequency of your bowel movements. °? More redness, swelling, or pain around your episiotomy or vaginal tear. °? More fluid or blood coming from your episiotomy or vaginal  tear. °? Pus or a bad smell coming from your episiotomy or vaginal tear. °? A fever. °? A rash. °? Little or no interest in activities you used to enjoy. °? Questions about caring for yourself or your baby. °· Your episiotomy or vaginal tear feels warm to the touch. °· Your episiotomy or vaginal tear is separating or does not appear to be healing. °· Your breasts are painful, hard, or turn red. °· You feel unusually sad or worried. °· You feel nauseous or you vomit. °· You pass large blood clots from your vagina. If you pass a blood clot from your vagina, save it to show to your health care provider. Do not flush blood clots down the toilet without having your health care provider look at them. °· You urinate more than usual. °· You are dizzy or light-headed. °· You have not breastfed at all and you have not had a menstrual period for 12 weeks after delivery. °· You have stopped breastfeeding and you have not had a menstrual period for 12 weeks after you stopped breastfeeding. °Get help right away if: °· You have: °? Pain that does not go away or does not get better with medicine. °? Chest pain. °? Difficulty breathing. °? Blurred vision or spots in your vision. °? Thoughts about hurting yourself or your baby. °· You develop pain in your abdomen or in one of your legs. °· You develop a severe headache. °· You faint. °· You bleed from your vagina so much that you fill two sanitary pads in one hour. °This information is not intended to replace advice given to you by your health care provider. Make sure you discuss any questions you have with your health care provider. °Document Released: 07/08/2000 Document Revised: 12/23/2015 Document Reviewed: 07/26/2015 °Elsevier Interactive Patient Education © 2019 [employer] Inc. ° °

## 2018-09-10 NOTE — Discharge Summary (Signed)
OB Discharge Summary     Patient Name: Jennifer Nelson DOB: 31-Jan-1996 MRN: [id number]  Date of admission: 09/10/2018 Delivering MD: Letitia Libra, MD  Date of Delivery: 09/10/2018  Date of discharge: 09/12/2018  Admitting diagnosis: TO DELIVER Intrauterine pregnancy: [redacted]w[redacted]d     Secondary diagnosis: Post Dates 41 weeks     Discharge diagnosis: Term Pregnancy Delivered, Post dates                         Hospital course:  Induction of Labor With Vaginal Delivery   23 y.o. yo G1P0000 at [redacted]w[redacted]d was admitted to the hospital 09/10/2018 for induction of labor.  Indication for induction: Postdates.  Patient had an uncomplicated labor course as follows: Membrane Rupture Time/Date: 12:35 PM ,09/10/2018   Intrapartum Procedures: Episiotomy:                                           Lacerations:     Patient had delivery of a Viable infant.  Information for the patient's newborn:  Adlie, Boehnlein [[id number]]  Delivery Method: Vag-Spont   09/10/2018  Details of delivery can be found in separate delivery note.  Patient had a routine postpartum course. Patient is discharged home 09/12/18.                                                                 Post partum procedures:none  Complications: None  Physical exam on 09/12/2018: Vitals:   09/11/18 1150 09/11/18 1527 09/11/18 2337 09/12/18 0721  BP: 132/81 125/80 (!) 131/92 126/82  Pulse: 74 78 77 70  Resp: 18 20 20 18   Temp: 98.4 F (36.9 C) 98.4 F (36.9 C) 98.2 F (36.8 C) 98.3 F (36.8 C)  TempSrc: Oral Oral Oral Oral  SpO2: 97% 98% 99% 99%  Weight:      Height:       General: alert, cooperative and no distress Lochia: appropriate Uterine Fundus: firm Incision: N/A DVT Evaluation: No evidence of DVT seen on physical exam.  Labs: Lab Results  Component Value Date   WBC 17.3 (H) 09/11/2018   HGB 9.2 (L) 09/11/2018   HCT 27.2 (L) 09/11/2018   MCV 93.8 09/11/2018   PLT 241 09/11/2018   CMP Latest Ref Rng & Units  08/21/2018  Glucose 65 - 99 mg/dL 73  BUN 6 - 20 mg/dL 2(L)  Creatinine 6.01 - 1.00 mg/dL 0.93(A)  Sodium 355 - 732 mmol/L 137  Potassium 3.5 - 5.2 mmol/L 3.5  Chloride 96 - 106 mmol/L 103  CO2 20 - 29 mmol/L 19(L)  Calcium 8.7 - 10.2 mg/dL 2.0(U)  Total Protein 6.0 - 8.5 g/dL 5.4(Y)  Total Bilirubin 0.0 - 1.2 mg/dL 0.3  Alkaline Phos 39 - 117 IU/L 112  AST 0 - 40 IU/L 16  ALT 0 - 32 IU/L 9    Discharge instruction: per After Visit Summary.  Medications:  Allergies as of 09/12/2018      Reactions   Nexium [esomeprazole Magnesium] Nausea And Vomiting      Medication List    STOP taking these medications   BONJESTA 20-20 MG Tbcr Generic  drug:  Doxylamine-Pyridoxine ER     TAKE these medications   norethindrone-ethinyl estradiol 1-20 MG-MCG tablet Commonly known as:  JUNEL FE,GILDESS FE,LOESTRIN FE Take 1 tablet by mouth daily. Start taking on:  September 30, 2018   prenatal multivitamin Tabs tablet Take 1 tablet by mouth daily at 12 noon.   PROCTOZONE-HC 2.5 % rectal cream Generic drug:  hydrocortisone Proctozone-HC 2.5 % topical cream perineal applicator  APPLY UTD BID FOR 1 MONTH       Diet: routine diet  Activity: Advance as tolerated. Pelvic rest for 6 weeks.   Outpatient follow up: Follow-up Information    Nadara Mustard, MD. Schedule an appointment as soon as possible for a visit in 6 week(s).   Specialty:  Obstetrics and Gynecology Why:  for [facility] f/u Contact information: [address] [place] [address] [phone]             Postpartum contraception: Combination OCPs Rhogam Given postpartum: no Rubella vaccine given postpartum: no Varicella vaccine given postpartum: no TDaP given antepartum or postpartum: Yes  Newborn Data: Live born female  Birth Weight:  3860g APGAR: 9, 9  Newborn Delivery   Birth date/time:  09/10/2018 18:16:00 Delivery type:  Vaginal, Spontaneous      Baby Feeding: Bottle  Disposition:home with  mother  SIGNED: Tresea Mall, CNM 09/12/2018 10:01 AM

## 2018-09-10 NOTE — Progress Notes (Signed)
RN notified Anesthesia of epidural site bleeding that was noticed when patient was changing position. Dr. Pernell Dupre aware and sending CRNA to further assess. RN will continue to monitor.

## 2018-09-10 NOTE — H&P (Signed)
Obstetrics Admission History & Physical   Postdates Induction of Labor  HPI:  23 y.o. G1P0000 @ [redacted]w[redacted]d (09/04/2018, by Last Menstrual Period). Admitted on 09/10/2018:   Patient Active Problem List   Diagnosis Date Noted  . Encounter for planned induction of labor 09/10/2018  . Braxton Hick's contraction 09/03/2018  . Positive GBS test 08/28/2018  . Carpal tunnel syndrome during pregnancy 07/28/2018  . Supervision of normal first pregnancy, antepartum 01/22/2018     Presents for a planned induction of labor for postdates. She is having some uterine irritability. She has not had vaginal bleeding or loss of fluid. Her baby is moving well.  Prenatal care at: at [facility]. Pregnancy complicated by Group B strep.  ROS: A review of systems was performed and negative, except as stated in the above HPI. PMHx: History reviewed. No pertinent past medical history. PSHx:  Past Surgical History:  Procedure Laterality Date  . DENTAL SURGERY     Medications:  Medications Prior to Admission  Medication Sig Dispense Refill Last Dose  . hydrocortisone (PROCTOZONE-HC) 2.5 % rectal cream Proctozone-HC 2.5 % topical cream perineal applicator  APPLY UTD BID FOR 1 MONTH   Past Month at Unknown time  . Prenatal Vit-Fe Fumarate-FA (PRENATAL MULTIVITAMIN) TABS tablet Take 1 tablet by mouth daily at 12 noon.   09/09/2018 at Unknown time  . Doxylamine-Pyridoxine ER (BONJESTA) 20-20 MG TBCR Bonjesta 20 mg-20 mg tablet,immediate and delay release   Not Taking at Unknown time   Allergies: is allergic to nexium [esomeprazole magnesium]. OBHx:  OB History  Gravida Para Term Preterm AB Living  1 0 0     0  SAB TAB Ectopic Multiple Live Births               # Outcome Date GA Lbr Len/2nd Weight Sex Delivery Anes PTL Lv  1 Current            ZES:[id number] except as detailed in HPI.Marland Kitchen  No family history of birth defects. Soc Hx: Never smoker, Alcohol: none and Recreational drug use: none  Objective:    Vitals:   09/10/18 0800  BP: 134/88  Pulse: 90  Resp: 16  Temp: 98.1 F (36.7 C)  SpO2: 97%   Constitutional: Well nourished, well developed female in no acute distress.  HEENT: normal Skin: Warm and dry.  Cardiovascular: Regular rate and rhythm.   Extremity: trace to 1+ bilateral pedal edema Respiratory: Clear to auscultation bilaterally. Normal respiratory effort Abdomen: gravid, non-tender Neuro: Cranial nerves grossly intact Psych: Alert and Oriented x3. No memory deficits. Normal mood and affect.  MS: normal gait, normal bilateral lower extremity ROM/strength/stability.  Cervix: 4/70/-2 (RN exam)  EFM:FHR: 125 bpm, variability: moderate,  accelerations:  Present,  decelerations:  Absent Toco: Occasional uterine irritability   Perinatal info:  Blood type: O positive Rubella - Immune Varicella - Immune TDaP Given during third trimester of this pregnancy RPR NR / HIV Neg/ HBsAg Neg   Assessment & Plan:   23 y.o. G1P0000 @ [redacted]w[redacted]d, Admitted on 09/10/2018 for an induction of labor.     Admit for labor, Antibiotics for GBS prophylaxis, Observe for cervical change, Fetal Wellbeing Reassuring, Epidural when ready and AROM when Appropriate  Marcelyn Bruins, CNM [facility], [facility] 09/10/2018  9:06 AM

## 2018-09-10 NOTE — Anesthesia Procedure Notes (Signed)
Epidural  Start time: 09/10/2018 2:20 PM End time: 09/10/2018 2:45 PM  Staffing Anesthesiologist: Jovita Gamma, MD Resident/CRNA: Irving Burton, CRNA Performed: resident/CRNA   Preanesthetic Checklist Completed: patient identified, site marked, surgical consent, pre-op evaluation, IV checked, risks and benefits discussed and monitors and equipment checked  Epidural Patient position: sitting Prep: ChloraPrep and site prepped and draped Patient monitoring: heart rate, cardiac monitor and blood pressure Approach: midline Location: L3-L4 Injection technique: LOR air  Needle:  Needle type: Tuohy  Needle gauge: 17 G Needle length: 9 cm Needle insertion depth: 7 cm Catheter type: closed end flexible Catheter size: 19 Gauge Catheter at skin depth: 12 cm Test dose: negative and 1.5% lidocaine with Epi 1:200 K  Assessment Events: blood not aspirated, injection not painful, no injection resistance, negative IV test and no paresthesia  Additional Notes Reason for block:procedure for pain

## 2018-09-10 NOTE — Progress Notes (Signed)
  Labor Progress Note   23 y.o. G1P0000 @ [redacted]w[redacted]d , admitted for  Pregnancy, Labor Management w Post Dates Induction.  Subjective:  Pressure, even w epidural.  Bloody show.  Objective:  BP 137/82   Pulse 97   Temp 98.2 F (36.8 C) (Oral)   Resp 16   Ht 5\' 7"  (1.702 m)   Wt 88 kg   LMP 11/28/2017 (Exact Date)   SpO2 100%   BMI 30.38 kg/m  Abd: gravid NT, ND Extr: trace to 1+ bilateral pedal edema SVE: CERVIX: 10 cm dilated, 100 effaced, +2 station  EFM: FHR: 120 bpm, variability: moderate,  accelerations:  Present,  decelerations:  Absent Toco: Frequency: Every 3 minutes Labs: I have reviewed the patient's lab results.   Assessment & Plan:  G1P0000 @ [redacted]w[redacted]d, admitted for  Pregnancy and Labor/Delivery Management  1. Pain management: epidural. 2. FWB: FHT category 1.  3. ID: GBS positive 4. Labor management: Start second stage ABC given for GBS Epidural in place  All discussed with patient, see orders  Annamarie Major, MD, [facility], [facility] 09/10/2018  5:41 PM

## 2018-09-11 LAB — CBC
HCT: 27.2 % — ABNORMAL LOW (ref 36.0–46.0)
Hemoglobin: 9.2 g/dL — ABNORMAL LOW (ref 12.0–15.0)
MCH: 31.7 pg (ref 26.0–34.0)
MCHC: 33.8 g/dL (ref 30.0–36.0)
MCV: 93.8 fL (ref 80.0–100.0)
Platelets: 241 10*3/uL (ref 150–400)
RBC: 2.9 MIL/uL — ABNORMAL LOW (ref 3.87–5.11)
RDW: 11.6 % (ref 11.5–15.5)
WBC: 17.3 10*3/uL — ABNORMAL HIGH (ref 4.0–10.5)
nRBC: 0 % (ref 0.0–0.2)

## 2018-09-11 LAB — RPR: RPR Ser Ql: NONREACTIVE

## 2018-09-11 MED ORDER — IBUPROFEN 600 MG PO TABS
600.0000 mg | ORAL_TABLET | Freq: Four times a day (QID) | ORAL | Status: DC
  Administered 2018-09-11 – 2018-09-12 (×2): 600 mg via ORAL
  Filled 2018-09-11: qty 1

## 2018-09-11 NOTE — Progress Notes (Signed)
Post Partum Day 1 Subjective: Doing well. She has some pain at the episiotomy site. Her pain is controlled with PO medication. She is getting ready to eat breakfast and is tolerating PO liquids. She is voiding and ambulating without difficulty. She denies feeling light headed or dizzy.   No CP SOB F/C N/V or leg pain No HA, change of vision, RUQ/epigastric pain  Objective: BP 127/85 (BP Location: Right Arm)   Pulse 81   Temp 98.5 F (36.9 C) (Oral)   Resp 20   Ht 5\' 7"  (1.702 m)   Wt 88 kg   LMP 11/28/2017 (Exact Date)   SpO2 98%   BMI 30.38 kg/m    Physical Exam:  General: NAD CV: RRR Pulm: nl effort, CTABL Lochia: moderate Uterine Fundus: fundus firm and below umbilicus DVT Evaluation: no cords, ttp LEs   Recent Labs    09/10/18 0756 09/11/18 0531  HGB 12.3 9.2*  HCT 36.0 27.2*  WBC 9.4 17.3*  PLT 206 241    Assessment/Plan: 23 y.o. G1P0000 postpartum day # 1  1. Continue routine postpartum care 2. O positive, Rubella Immune, Varicella Immune 3. TDAP status: given antepartum 4. Iron deficiency Anemia from acute blood loss: PO Fe 5. Formula feeding 6. Contraception: considering OCP 7. Disposition: discharge to home tomorrow  Tresea Mall, CNM  [facility], [facility]

## 2018-09-11 NOTE — Anesthesia Postprocedure Evaluation (Signed)
Anesthesia Post Note  Patient: Jennifer Nelson  Procedure(s) Performed: AN AD HOC LABOR EPIDURAL  Patient location during evaluation: Mother Baby Anesthesia Type: Epidural Level of consciousness: awake and alert Pain management: pain level controlled Vital Signs Assessment: post-procedure vital signs reviewed and stable Respiratory status: spontaneous breathing, nonlabored ventilation and respiratory function stable Cardiovascular status: stable Postop Assessment: no headache, no backache, epidural receding, patient able to bend at knees and able to ambulate Anesthetic complications: no     Last Vitals:  Vitals:   09/11/18 0333 09/11/18 0718  BP: 130/88 127/85  Pulse: 81 81  Resp: 18 20  Temp: 37.1 C 36.9 C  SpO2: 99% 98%    Last Pain:  Vitals:   09/11/18 0718  TempSrc: Oral  PainSc:                  Karoline Caldwell

## 2018-09-12 MED ORDER — NORETHIN ACE-ETH ESTRAD-FE 1-20 MG-MCG PO TABS: 1.0000 | ORAL_TABLET | Freq: Every day | ORAL | 4 refills | Status: DC

## 2018-09-12 NOTE — Progress Notes (Signed)
Talked with pt about her score "8" on the Edinburgh PP Depression Scale.  Pt states that the thoughts she wrote down on the paper scale were in relationship to "being pregnant".  Denies any history of depression or being on medication.  Pt denied need to be started on medication at this point

## 2018-09-12 NOTE — Progress Notes (Signed)
DC instr reviewed with pt.  Verb u/o.  Rx given for [facility].

## 2018-09-13 ENCOUNTER — Telehealth: Payer: Self-pay

## 2018-09-13 NOTE — Telephone Encounter (Signed)
Pt returned call. That one clot is all she has passed.  Adv it would be more concerning if she were having one clot after another come out but just one isn't that concerning.  Adv we don't want her to saturate a pad q45min-1hr.  If she does it's our policy she go to ED.

## 2018-09-13 NOTE — Telephone Encounter (Signed)
Pt was d/c yesterday after delivering on the 2022-11-17.  Today she has passed a golf ball sized clot.  Her d/c papers said to call if this happened.  [phone]  LMTC.

## 2018-09-17 ENCOUNTER — Encounter: Payer: Self-pay | Admitting: Advanced Practice Midwife

## 2018-09-17 ENCOUNTER — Ambulatory Visit (INDEPENDENT_AMBULATORY_CARE_PROVIDER_SITE_OTHER): Payer: BC Managed Care – PPO | Admitting: Advanced Practice Midwife

## 2018-09-17 VITALS — BP 124/90 | HR 63 | Ht 67.0 in | Wt 170.0 lb

## 2018-09-17 DIAGNOSIS — Z0130 Encounter for examination of blood pressure without abnormal findings: Secondary | ICD-10-CM

## 2018-09-17 NOTE — Progress Notes (Signed)
Obstetrics & Gynecology Office Visit   Chief Complaint:  Chief Complaint  Patient presents with  . Follow-up    BP check     History of Present Illness: 23 y.o. G1P1001 being seen for blood pressure check today. The patient is 1 week postpartum. She had no issues with blood pressure during her pregnancy. She had a home visit with visiting RN this morning and blood pressure at that time was 148/100 with repeat of 156/104. She denies any headache, visual changes or epigastric pain, fever, chills, breast issues, pain. The established diagnosis for the patient is s/p full term spontaneous vaginal delivery.  She is currently on no antihypertensives. She reports no current symptoms attributable to her blood pressure.  Medication list reviewed medications which may contribute to BP elevation were not noted.  Review of Systems:  Review of Systems  Constitutional: Negative.   HENT: Negative.   Eyes: Negative.   Respiratory: Negative.   Cardiovascular: Negative.   Gastrointestinal: Negative.   Genitourinary: Negative.   Musculoskeletal: Negative.   Skin: Negative.   Neurological: Negative.   Endo/Heme/Allergies: Negative.   Psychiatric/Behavioral: Negative.     Past Medical History:  No past medical history on file.  Past Surgical History:  Past Surgical History:  Procedure Laterality Date  . DENTAL SURGERY      Gynecologic History: Patient's last menstrual period was 11/28/2017 (exact date).  Obstetric History: G1P1001  Family History:  No family history on file.  Social History:  Social History   Socioeconomic History  . Marital status: Single    Spouse name: [name]  . Number of children: Not on file  . Years of education: Not on file  . Highest education level: Not on file  Occupational History  . Not on file  Social Needs  . Financial resource strain: Not on file  . Food insecurity:    Worry: Not on file    Inability: Not on file  . Transportation needs:      Medical: Not on file    Non-medical: Not on file  Tobacco Use  . Smoking status: Never Smoker  . Smokeless tobacco: Never Used  Substance and Sexual Activity  . Alcohol use: Not Currently  . Drug use: Never  . Sexual activity: Not Currently    Birth control/protection: None  Lifestyle  . Physical activity:    Days per week: Not on file    Minutes per session: Not on file  . Stress: Not on file  Relationships  . Social connections:    Talks on phone: Not on file    Gets together: Not on file    Attends religious service: Not on file    Active member of club or organization: Not on file    Attends meetings of clubs or organizations: Not on file    Relationship status: Not on file  . Intimate partner violence:    Fear of current or ex partner: Not on file    Emotionally abused: Not on file    Physically abused: Not on file    Forced sexual activity: Not on file  Other Topics Concern  . Not on file  Social History Narrative  . Not on file    Allergies:  Allergies  Allergen Reactions  . Nexium [Esomeprazole Magnesium] Nausea And Vomiting    Medications: Prior to Admission medications   Medication Sig Start Date End Date Taking? Authorizing Provider  ibuprofen (ADVIL,MOTRIN) 200 MG tablet Take 200 mg by mouth  every 6 (six) hours as needed.   Yes [provider]  Prenatal Vit-Fe Fumarate-FA (PRENATAL MULTIVITAMIN) TABS tablet Take 1 tablet by mouth daily at 12 noon.   Yes [provider]  hydrocortisone (PROCTOZONE-HC) 2.5 % rectal cream Proctozone-HC 2.5 % topical cream perineal applicator  APPLY UTD BID FOR 1 MONTH    [provider]  norethindrone-ethinyl estradiol (JUNEL FE,GILDESS FE,LOESTRIN FE) 1-20 MG-MCG tablet Take 1 tablet by mouth daily. Patient not taking: Reported on 09/17/2018 09/30/18 09/30/19  Tresea Mall, CNM    Physical Exam Blood pressure 124/90, pulse 63, height 5\' 7"  (1.702 m), weight 170 lb (77.1 kg), last menstrual  period 11/28/2017, not currently breastfeeding.  Patient's last menstrual period was 11/28/2017 (exact date).  General: NAD HEENT: normocephalic, anicteric Pulmonary: No increased work of breathing Cardiovascular: RRR, distal pulses 2+ Extremities: no edema, no erythema, no tenderness Neurologic: Grossly intact Psychiatric: mood appropriate, affect full  Assessment: 23 y.o. G1P1001 presenting for blood pressure evaluation today  Plan: Problem List Items Addressed This Visit    None      1) Blood pressure - blood pressure at today's visit is mildly elevated diastolic.  As a result antihypertensive therapy is currently not warranted. - additional blood work was not obtained 2) Precautions and warning signs discussed 3) RTC for any worsening symptoms 4) Return for 6 week postpartum visit   Tresea Mall, CNM [facility], [facility] 09/17/2018, 4:34 PM

## 2018-10-23 ENCOUNTER — Ambulatory Visit (INDEPENDENT_AMBULATORY_CARE_PROVIDER_SITE_OTHER): Payer: BC Managed Care – PPO | Admitting: Obstetrics & Gynecology

## 2018-10-23 ENCOUNTER — Other Ambulatory Visit (HOSPITAL_COMMUNITY)
Admission: RE | Admit: 2018-10-23 | Discharge: 2018-10-23 | Disposition: A | Discharge status: Home / Self Care | Payer: BC Managed Care – PPO | Source: Ambulatory Visit | Attending: Obstetrics & Gynecology | Admitting: Obstetrics & Gynecology

## 2018-10-23 ENCOUNTER — Encounter: Payer: Self-pay | Admitting: Obstetrics & Gynecology

## 2018-10-23 ENCOUNTER — Other Ambulatory Visit: Payer: Self-pay

## 2018-10-23 VITALS — BP 120/80 | Ht 67.0 in | Wt 166.0 lb

## 2018-10-23 DIAGNOSIS — Z124 Encounter for screening for malignant neoplasm of cervix: Secondary | ICD-10-CM | POA: Diagnosis not present

## 2018-10-23 DIAGNOSIS — Z1389 Encounter for screening for other disorder: Secondary | ICD-10-CM

## 2018-10-23 DIAGNOSIS — Z113 Encounter for screening for infections with a predominantly sexual mode of transmission: Secondary | ICD-10-CM

## 2018-10-23 MED ORDER — NORETHIN-ETH ESTRAD-FE BIPHAS 1 MG-10 MCG / 10 MCG PO TABS: 1.0000 | ORAL_TABLET | Freq: Every day | ORAL | 3 refills | Status: DC

## 2018-10-23 NOTE — Patient Instructions (Signed)
Ethinyl Estradiol; Norethindrone Acetate; Ferrous fumarate tablets or capsules  What is this medicine?  ETHINYL ESTRADIOL; NORETHINDRONE ACETATE; FERROUS FUMARATE (ETH in il es tra DYE ole; nor eth IN drone AS e tate; FER us FUE ma rate) is an oral contraceptive. The products combine two types of female hormones, an estrogen and a progestin. They are used to prevent ovulation and pregnancy. Some products are also used to treat acne in females.  This medicine may be used for other purposes; ask your health care provider or pharmacist if you have questions.  COMMON BRAND NAME(S): Aurovela 24 Fe 1/20, Aurovela Fe, Blisovi 24 Fe, Blisovi Fe, Estrostep Fe, Gildess 24 Fe, Gildess Fe 1.5/30, Gildess Fe 1/20, Hailey 24 Fe, Junel Fe 1.5/30, Junel Fe 1/20, Junel Fe 24, Larin Fe, Lo Loestrin Fe, Loestrin 24 Fe, Loestrin FE 1.5/30, Loestrin FE 1/20, Lomedia 24 Fe, Microgestin 24 Fe, Microgestin Fe 1.5/30, Microgestin Fe 1/20, Tarina 24 Fe, Tarina Fe 1/20, Taytulla, Tilia Fe, Tri-Legest Fe  What should I tell my health care provider before I take this medicine?  They need to know if you have any of these conditions:  -abnormal vaginal bleeding  -blood vessel disease  -breast, cervical, endometrial, ovarian, liver, or uterine cancer  -diabetes  -gallbladder disease  -heart disease or recent heart attack  -high blood pressure  -high cholesterol  -history of blood clots  -kidney disease  -liver disease  -migraine headaches  -smoke tobacco  -stroke  -systemic lupus erythematosus (SLE)  -an unusual or allergic reaction to estrogens, progestins, other medicines, foods, dyes, or preservatives  -pregnant or trying to get pregnant  -breast-feeding  How should I use this medicine?  Take this medicine by mouth. To reduce nausea, this medicine may be taken with food. Follow the directions on the prescription label. Take this medicine at the same time each day and in the order directed on the package. Do not take your medicine more often  than directed.  A patient package insert for the product will be given with each prescription and refill. Read this sheet carefully each time. The sheet may change frequently.  Contact your pediatrician regarding the use of this medicine in children. Special care may be needed. This medicine has been used in female children who have started having menstrual periods.  Overdosage: If you think you have taken too much of this medicine contact a poison control center or emergency room at once.  NOTE: This medicine is only for you. Do not share this medicine with others.  What if I miss a dose?  If you miss a dose, refer to the patient information sheet you received with your medicine for direction. If you miss more than one pill, this medicine may not be as effective and you may need to use another form of birth control.  What may interact with this medicine?  Do not take this medicine with the following medication:  -dasabuvir; ombitasvir; paritaprevir; ritonavir  -ombitasvir; paritaprevir; ritonavir  This medicine may also interact with the following medications:  -acetaminophen  -antibiotics or medicines for infections, especially rifampin, rifabutin, rifapentine, and griseofulvin, and possibly penicillins or tetracyclines  -aprepitant  -ascorbic acid (vitamin C)  -atorvastatin  -barbiturate medicines, such as phenobarbital  -bosentan  -carbamazepine  -caffeine  -clofibrate  -cyclosporine  -dantrolene  -doxercalciferol  -felbamate  -grapefruit juice  -hydrocortisone  -medicines for anxiety or sleeping problems, such as diazepam or temazepam  -medicines for diabetes, including pioglitazone  -mineral oil  -modafinil  -mycophenolate  -  nefazodone  -oxcarbazepine  -phenytoin  -prednisolone  -ritonavir or other medicines for HIV infection or AIDS  -rosuvastatin  -selegiline  -soy isoflavones supplements  -St. John's wort  -tamoxifen or raloxifene  -theophylline  -thyroid hormones  -topiramate  -warfarin  This list may not  describe all possible interactions. Give your health care provider a list of all the medicines, herbs, non-prescription drugs, or dietary supplements you use. Also tell them if you smoke, drink alcohol, or use illegal drugs. Some items may interact with your medicine.  What should I watch for while using this medicine?  Visit your doctor or health care professional for regular checks on your progress. You will need a regular breast and pelvic exam and Pap smear while on this medicine.  Use an additional method of contraception during the first cycle that you take these tablets.  If you have any reason to think you are pregnant, stop taking this medicine right away and contact your doctor or health care professional.  If you are taking this medicine for hormone related problems, it may take several cycles of use to see improvement in your condition.  Smoking increases the risk of getting a blood clot or having a stroke while you are taking birth control pills, especially if you are more than 25 years old. You are strongly advised not to smoke.  This medicine can make your body retain fluid, making your fingers, hands, or ankles swell. Your blood pressure can go up. Contact your doctor or health care professional if you feel you are retaining fluid.  This medicine can make you more sensitive to the sun. Keep out of the sun. If you cannot avoid being in the sun, wear protective clothing and use sunscreen. Do not use sun lamps or tanning beds/booths.  If you wear contact lenses and notice visual changes, or if the lenses begin to feel uncomfortable, consult your eye care specialist.  In some women, tenderness, swelling, or minor bleeding of the gums may occur. Notify your dentist if this happens. Brushing and flossing your teeth regularly may help limit this. See your dentist regularly and inform your dentist of the medicines you are taking.  If you are going to have elective surgery, you may need to stop taking this  medicine before the surgery. Consult your health care professional for advice.  This medicine does not protect you against HIV infection (AIDS) or any other sexually transmitted diseases.  What side effects may I notice from receiving this medicine?  Side effects that you should report to your doctor or health care professional as soon as possible:  -allergic reactions like skin rash, itching or hives, swelling of the face, lips, or tongue  -breast tissue changes or discharge  -changes in vaginal bleeding during your period or between your periods  -changes in vision  -chest pain  -confusion  -coughing up blood  -dizziness  -feeling faint or lightheaded  -headaches or migraines  -leg, arm or groin pain  -loss of balance or coordination  -severe or sudden headaches  -stomach pain (severe)  -sudden shortness of breath  -sudden numbness or weakness of the face, arm or leg  -symptoms of vaginal infection like itching, irritation or unusual discharge  -tenderness in the upper abdomen  -trouble speaking or understanding  -vomiting  -yellowing of the eyes or skin  Side effects that usually do not require medical attention (report to your doctor or health care professional if they continue or are bothersome):  -breakthrough bleeding   and spotting that continues beyond the 3 initial cycles of pills  -breast tenderness  -mood changes, anxiety, depression, frustration, anger, or emotional outbursts  -increased sensitivity to sun or ultraviolet light  -nausea  -skin rash, acne, or brown spots on the skin  -weight gain (slight)  This list may not describe all possible side effects. Call your doctor for medical advice about side effects. You may report side effects to FDA at 1-800-FDA-1088.  Where should I keep my medicine?  Keep out of the reach of children.  Store at room temperature between 15 and 30 degrees C (59 and 86 degrees F). Throw away any unused medicine after the expiration date.  NOTE: This sheet is a summary. It may  not cover all possible information. If you have questions about this medicine, talk to your doctor, pharmacist, or health care provider.  © 2019 Elsevier/Gold Standard (2016-03-21 08:04:41)

## 2018-10-23 NOTE — Progress Notes (Signed)
  OBSTETRICS POSTPARTUM CLINIC PROGRESS NOTE  Subjective:     Jennifer Nelson is a 23 y.o. G59P1001 female who presents for a postpartum visit. She is 6 weeks postpartum following a Term pregnancy and delivery by Vaginal, no problems at delivery.  I have fully reviewed the prenatal and intrapartum course. Anesthesia: epidural.  Postpartum course has been complicated by uncomplicated.  Baby is feeding by Bottle.  Bleeding: patient has  resumed menses.  Bowel function is normal. Bladder function is normal.  Patient is not sexually active. Contraception method desired is OCP (estrogen/progesterone).  Postpartum depression screening: negative. Edinburgh 3.  The following portions of the patient's history were reviewed and updated as appropriate: allergies, current medications, past family history, past medical history, past social history, past surgical history and problem list.  Review of Systems Pertinent items are noted in HPI.  Objective:    BP 120/80   Ht 5\' 7"  (1.702 m)   Wt 166 lb (75.3 kg)   LMP 10/16/2018   BMI 26.00 kg/m   General:  alert and no distress   Breasts:  inspection negative, no nipple discharge or bleeding, no masses or nodularity palpable  Lungs: clear to auscultation bilaterally  Heart:  regular rate and rhythm, S1, S2 normal, no murmur, click, rub or gallop  Abdomen: soft, non-tender; bowel sounds normal; no masses,  no organomegaly.     Vulva:  normal  Vagina: normal vagina, no discharge, exudate, lesion, or erythema  Cervix:  no cervical motion tenderness and no lesions  Corpus: normal size, contour, position, consistency, mobility, non-tender  Adnexa:  normal adnexa and no mass, fullness, tenderness  Rectal Exam: Not performed.          Assessment:  Post Partum Care visit 1. Screening for cervical cancer - Cytology - PAP  2. Postpartum care following vaginal delivery - OCP discussed  OCPs The risks /benefits of OCPs have been explained to the  patient in detail.  Product literature has been given to her.  I have instructed her in the use of OCPs and have given her literature reinforcing this information.  I have explained to the patient that OCPs are not as effective for birth control during the first month of use, and that another form of contraception should be used during this time.  Both first-day start and Sunday start have been explained.  The risks and benefits of each was discussed.  She has been made aware of  the fact that other medications may affect the efficacy of OCPs.  I have answered all of her questions, and I believe that she has an understanding of the effectiveness and use of OCPs.  3. Screen for STD (sexually transmitted disease) - Cytology - PAP   Plan:  See orders and Patient Instructions Follow up in: 12 months or as needed.   Annamarie Major, MD, [facility], [facility] 10/23/2018  10:35 AM

## 2018-10-26 LAB — CYTOLOGY - PAP
Chlamydia: NEGATIVE
Diagnosis: UNDETERMINED — AB
HPV: DETECTED — AB
Neisseria Gonorrhea: NEGATIVE

## 2018-10-26 NOTE — Progress Notes (Signed)
PAP results d/w pt; AGUS, in need of Colposcopy and further investigation.  Pts questions are answered, and will sch appt.  Sch Colpo/Bx w PH soon.

## 2018-11-07 ENCOUNTER — Other Ambulatory Visit (HOSPITAL_COMMUNITY)
Admission: RE | Admit: 2018-11-07 | Discharge: 2018-11-07 | Disposition: A | Discharge status: Home / Self Care | Payer: BLUE CROSS/BLUE SHIELD | Source: Ambulatory Visit | Attending: Obstetrics & Gynecology | Admitting: Obstetrics & Gynecology

## 2018-11-07 ENCOUNTER — Other Ambulatory Visit: Payer: Self-pay

## 2018-11-07 ENCOUNTER — Encounter: Payer: Self-pay | Admitting: Obstetrics & Gynecology

## 2018-11-07 ENCOUNTER — Ambulatory Visit (INDEPENDENT_AMBULATORY_CARE_PROVIDER_SITE_OTHER): Payer: BLUE CROSS/BLUE SHIELD | Admitting: Obstetrics & Gynecology

## 2018-11-07 VITALS — BP 120/80 | Ht 67.0 in | Wt 168.0 lb

## 2018-11-07 DIAGNOSIS — R87618 Other abnormal cytological findings on specimens from cervix uteri: Secondary | ICD-10-CM

## 2018-11-07 DIAGNOSIS — R87619 Unspecified abnormal cytological findings in specimens from cervix uteri: Secondary | ICD-10-CM

## 2018-11-07 DIAGNOSIS — D060 Carcinoma in situ of endocervix: Secondary | ICD-10-CM

## 2018-11-07 DIAGNOSIS — R8789 Other abnormal findings in specimens from female genital organs: Secondary | ICD-10-CM

## 2018-11-07 NOTE — Patient Instructions (Signed)
Colposcopy, Care After  This sheet gives you information about how to care for yourself after your procedure. Your health care provider may also give you more specific instructions. If you have problems or questions, contact your health care provider.  What can I expect after the procedure?  If you had a colposcopy without a biopsy, you can expect to feel fine right away, but you may have some spotting for a few days. You can go back to your normal activities.  If you had a colposcopy with a biopsy, it is common to have:   Soreness and pain. This may last for a few days.   Light-headedness.   Mild vaginal bleeding or dark-colored, grainy discharge. This may last for a few days. The discharge may be due to a solution that was used during the procedure. You may need to wear a sanitary pad during this time.   Spotting for at least 48 hours after the procedure.  Follow these instructions at home:     Take over-the-counter and prescription medicines only as told by your health care provider. Talk with your health care provider about what type of over-the-counter pain medicine and prescription medicine you can start taking again. It is especially important to talk with your health care provider if you take blood-thinning medicine.   Do not drive or use heavy machinery while taking prescription pain medicine.   For at least 3 days after your procedure, or as long as told by your health care provider, avoid:  ? Douching.  ? Using tampons.  ? Having sexual intercourse.   Continue to use birth control (contraception).   Limit your physical activity for the first day after the procedure as told by your health care provider. Ask your health care provider what activities are safe for you.   It is up to you to get the results of your procedure. Ask your health care provider, or the department performing the procedure, when your results will be ready.   Keep all follow-up visits as told by your health care provider.  This is important.  Contact a health care provider if:   You develop a skin rash.  Get help right away if:   You are bleeding heavily from your vagina or you are passing blood clots. This includes using more than one sanitary pad per hour for 2 hours in a row.   You have a fever or chills.   You have pelvic pain.   You have abnormal, yellow-colored, or bad-smelling vaginal discharge. This could be a sign of infection.   You have severe pain or cramps in your lower abdomen that do not get better with medicine.   You feel light-headed or dizzy, or you faint.  Summary   If you had a colposcopy without a biopsy, you can expect to feel fine right away, but you may have some spotting for a few days. You can go back to your normal activities.   If you had a colposcopy with a biopsy, you may notice mild pain and spotting for 48 hours after the procedure.   Avoid douching, using tampons, and having sexual intercourse for 3 days after the procedure or as long as told by your health care provider.   Contact your health care provider if you have bleeding, severe pain, or signs of infection.  This information is not intended to replace advice given to you by your health care provider. Make sure you discuss any questions you have with your   health care provider.  Document Released: 05/01/2013 Document Revised: 02/26/2016 Document Reviewed: 02/26/2016  Elsevier Interactive Patient Education  2019 Elsevier Inc.

## 2018-11-07 NOTE — Progress Notes (Signed)
HPI:  Jennifer Nelson is a 23 y.o.  G1P1001  who presents today for evaluation and management of abnormal cervical cytology.    Dysplasia History:  AGUS, HPV POS  ROS:  Pertinent items are noted in HPI.  OB History  Gravida Para Term Preterm AB Living  1 1 1     1   SAB TAB Ectopic Multiple Live Births          1    # Outcome Date GA Lbr Len/2nd Weight Sex Delivery Anes PTL Lv  1 Term 09/10/18 [redacted]w[redacted]d  8 lb 8 oz (3.856 kg) M Vag-Spont EPI  LIV    History reviewed. No pertinent past medical history.  Past Surgical History:  Procedure Laterality Date  . DENTAL SURGERY      SOCIAL HISTORY: Social History   Substance and Sexual Activity  Alcohol Use Not Currently   Social History   Substance and Sexual Activity  Drug Use Never     History reviewed. No pertinent family history.  ALLERGIES:  Nexium [esomeprazole magnesium]  Current Outpatient Medications on File Prior to Visit  Medication Sig Dispense Refill  . Norethindrone-Ethinyl Estradiol-Fe Biphas (LO LOESTRIN FE) 1 MG-10 MCG / 10 MCG tablet Take 1 tablet by mouth daily. 84 tablet 3  . hydrocortisone (PROCTOZONE-HC) 2.5 % rectal cream Proctozone-HC 2.5 % topical cream perineal applicator  APPLY UTD BID FOR 1 MONTH    . ibuprofen (ADVIL,MOTRIN) 200 MG tablet Take 200 mg by mouth every 6 (six) hours as needed.    . norethindrone-ethinyl estradiol (JUNEL FE,GILDESS FE,LOESTRIN FE) 1-20 MG-MCG tablet Take 1 tablet by mouth daily. (Patient not taking: Reported on 09/17/2018) 3 Package 4  . Prenatal Vit-Fe Fumarate-FA (PRENATAL MULTIVITAMIN) TABS tablet Take 1 tablet by mouth daily at 12 noon.     No current facility-administered medications on file prior to visit.     Physical Exam: -Vitals:  BP 120/80   Ht 5\' 7"  (1.702 m)   Wt 168 lb (76.2 kg)   LMP 10/16/2018   BMI 26.31 kg/m  GEN: WD, WN, NAD.  A+ O x 3, good mood and affect. ABD:  NT, ND.  Soft, no masses.  No hernias noted.   Pelvic:   Vulva: Normal  appearance.  No lesions.  Vagina: No lesions or abnormalities noted.  Support: Normal pelvic support.  Urethra No masses tenderness or scarring.  Meatus Normal size without lesions or prolapse.  Cervix: See below.  Anus: Normal exam.  No lesions.  Perineum: Normal exam.  No lesions.        Bimanual   Uterus: Normal size.  Non-tender.  Mobile.  AV.  Adnexae: No masses.  Non-tender to palpation.  Cul-de-sac: Negative for abnormality.   PROCEDURE: 1.  Urine Pregnancy Test:  not done 2.  Colposcopy performed with 4% acetic acid after verbal consent obtained                                         -Aceto-white Lesions Location(s): 12 o'clock.              -Biopsy performed at 12, 6 o'clock               -ECC indicated and performed: Yes.       -Biopsy sites made hemostatic with pressure, AgNO3, and/or Monsel's solution   -Satisfactory colposcopy: Yes.      -  Evidence of Invasive cervical CA :  NO.  Endometrial Biopsy After discussion with the patient regarding her abnormal uterine bleeding I recommended that she proceed with an endometrial biopsy for further diagnosis. The risks, benefits, alternatives, and indications for an endometrial biopsy were discussed with the patient in detail. She understood the risks including infection, bleeding, cervical laceration and uterine perforation.  Verbal consent was obtained.   PROCEDURE NOTE:  Pipelle endometrial biopsy was performed using aseptic technique with iodine preparation.  The uterus was sounded to a length of 7 cm.  Adequate sampling was obtained with minimal blood loss.  The patient tolerated the procedure well.  Disposition will be pending pathology.  ASSESSMENT:  [name] is a 23 y.o. G1P1001 here for  1. 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 Abnormal Papanicolaou smear of cervix with positive human papilloma virus (HPV) test    PLAN: 1.  I discussed the grading system of  pap smears and HPV high risk viral types.  We will discuss and base management after colpo results return. 2. Follow up PAP 6 months, vs intervention if high grade dysplasia identified 3. Treatment of persistantly abnormal PAP smears and cervical dysplasia, even mild, is discussed w pt today in detail, as well as the pros and cons of Cryo and LEEP procedures. Will consider and discuss after results. 4. EMB for AGUS, risks of endometrial cancer discussed      Annamarie MajorPaul Anna-Marie Coller, MD, [facility]AC[facility], [facility] 11/07/2018  10:21 AM

## 2018-11-12 ENCOUNTER — Telehealth: Payer: Self-pay | Admitting: Obstetrics & Gynecology

## 2018-11-12 NOTE — Telephone Encounter (Signed)
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

## 2018-11-12 NOTE — Telephone Encounter (Signed)
Patient is aware of telephone visit for H&P on 11/15/18 @ 4:10pm w/ Dr. Tiburcio Pea, and in-office LEEP on 11/20/18 @ 1:30pm. Cost and [employer] were discussed.

## 2018-11-15 ENCOUNTER — Other Ambulatory Visit: Payer: Self-pay

## 2018-11-15 ENCOUNTER — Ambulatory Visit (INDEPENDENT_AMBULATORY_CARE_PROVIDER_SITE_OTHER): Payer: BLUE CROSS/BLUE SHIELD | Admitting: Obstetrics & Gynecology

## 2018-11-15 ENCOUNTER — Encounter: Payer: Self-pay | Admitting: Obstetrics & Gynecology

## 2018-11-15 VITALS — Ht 67.0 in

## 2018-11-15 DIAGNOSIS — D069 Carcinoma in situ of cervix, unspecified: Secondary | ICD-10-CM | POA: Diagnosis not present

## 2018-11-15 MED ORDER — DIAZEPAM 5 MG PO TABS: 5.0000 mg | ORAL_TABLET | Freq: Once | ORAL | 0 refills | Status: AC

## 2018-11-15 MED ORDER — PROMETHAZINE HCL 25 MG PO TABS: 25.0000 mg | ORAL_TABLET | Freq: Once | ORAL | 0 refills | Status: DC

## 2018-11-15 MED ORDER — HYDROCODONE-ACETAMINOPHEN 5-325 MG PO TABS: 1.0000 | ORAL_TABLET | Freq: Four times a day (QID) | ORAL | 0 refills | Status: DC | PRN

## 2018-11-15 NOTE — Patient Instructions (Signed)
Loop Electrosurgical Excision Procedure    Loop electrosurgical excision procedure (LEEP) is the cutting and removal (excision) of part of the cervix. The cervix is the bottom part of the uterus that opens into the vagina. The tissue that is removed from the cervix is then examined to see if there are precancerous cells or cancer cells present. LEEP may be done when:  You have abnormal bleeding from your cervix.  You have an abnormal Pap test result.  Your health care provider finds an abnormality on your cervix during a pelvic exam.  LEEP typically only takes a few minutes and is often done in the health care provider's office. The procedure is safe for women who are trying to get pregnant. However, the procedure is usually not done during a menstrual period or during pregnancy.  Tell a health care provider about:  Any allergies you have.  All medicines you are taking, including vitamins, herbs, eye drops, creams, and over-the-counter medicines.  Any problems you or family members have had with anesthetic medicines.  Any blood disorders you have.  Any surgeries you have had.  Any medical conditions you have, including current or past vaginal infections, such as herpes or sexually transmitted infections (STIs).  Whether you are pregnant or may be pregnant.  What are the risks?  Generally, this is a safe procedure. However, problems may occur, including:  Infection.  Bleeding.  Allergic reactions to medicines.  Changes or scarring in the cervix.  Increased risk of early (preterm) labor in future pregnancies.  What happens before the procedure?  Ask your health care provider about:  Changing or stopping your regular medicines. This is especially important if you are taking diabetes medicines or blood thinners.  Taking medicines such as aspirin and ibuprofen. These medicines can thin your blood. Do not take these medicines before your procedure if your health care provider instructs you not to.  Your health care  provider may recommend that you take pain medicine before the procedure.  Plan to have someone take you home after the procedure.  What happens during the procedure?  An instrument called a speculum will be placed in your vagina. This will allow your health care provider to see the cervix.  You will be given a medicine to numb the area (local anesthetic). The medicine will be injected into your cervix and the surrounding area.  A solution will be applied to your cervix. This solution will help the health care provider find the abnormal cells that need to be removed.  A thin wire loop will be passed through your vagina. The wire will be used to burn (cauterize) the cervical tissue with an electrical current.  The abnormal cervical tissue will be removed.  Any open blood vessels will be cauterized to prevent bleeding.  A paste may be applied to the cauterized area of your cervix to help prevent bleeding.  The sample of cervical tissue will be examined under a microscope.  The procedure may vary among health care providers and hospitals.  What happens after the procedure?  You may have mild abdominal cramping.  You may have a small amount of bleeding (spotting) from the vagina.  You may have a dark-colored discharge coming from your vagina. This is from the paste that was used on the cervix to prevent bleeding.  Your health care provider may recommend pelvic rest. Pelvic rest generally means avoiding sex and not putting anything in the vagina, such as tampons, creams, and douches.  It is   your responsibility to get your test results. Ask your health care provider or the department performing the test when your results will be ready.  This information is not intended to replace advice given to you by your health care provider. Make sure you discuss any questions you have with your health care provider.  Document Released: 10/01/2002 Document Revised: 08/07/2015 Document Reviewed: 05/25/2015  Elsevier Interactive Patient  Education  2019 Elsevier Inc.

## 2018-11-15 NOTE — Progress Notes (Signed)
Virtual Visit via Telephone Note  I connected with Jennifer Nelson on 11/15/18 at  4:10 PM EDT by telephone and verified that I am speaking with the correct person using two identifiers.   I discussed the limitations, risks, security and privacy concerns of performing an evaluation and management service by telephone and the availability of in person appointments. I also discussed with the patient that there may be a patient responsible charge related to this service. The patient expressed understanding and agreed to proceed.  She was at home and I was in my office.     PRE-OPERATIVE HISTORY AND PHYSICAL EXAM  HPI:  Jennifer Nelson is a 23 y.o. G1P1001 Patient's last menstrual period was 10/16/2018.; she is being admitted for surgery related to cervical dysplasia.   PAP- AGUS, HPV POS  Biopsies: Diagnosis 1. Endometrium, biopsy - BENIGN ENDOMETRIUM WITH STROMAL HYALINE FIBROSIS. SEE NOTE - NEGATIVE FOR HYPERPLASIA OR MALIGNANCY 2. 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 Cervix, biopsy, 6 o'clock - HIGH-GRADE SQUAMOUS INTRAEPITHELIAL LESION (CIN 2-3, HIGH-GRADE DYSPLASIA) 4. Cervix, biopsy, 12 o'clock - HIGH-GRADE SQUAMOUS INTRAEPITHELIAL LESION (CIN 2-3, HIGH-GRADE DYSPLASIA)  PMHx: History reviewed. No pertinent past medical history. Past Surgical History:  Procedure Laterality Date  . DENTAL SURGERY     History reviewed. No pertinent family history. Social History   Tobacco Use  . Smoking status: Never Smoker  . Smokeless tobacco: Never Used  Substance Use Topics  . Alcohol use: Not Currently  . Drug use: Never    Current Outpatient Medications:  .  Norethindrone-Ethinyl Estradiol-Fe Biphas (LO LOESTRIN FE) 1 MG-10 MCG / 10 MCG tablet, Take 1 tablet by mouth daily., Disp: 84 tablet, Rfl: 3 .  hydrocortisone (PROCTOZONE-HC) 2.5 % rectal cream, Proctozone-HC 2.5 %  topical cream perineal applicator  APPLY UTD BID FOR 1 MONTH, Disp: , Rfl:  .  ibuprofen (ADVIL,MOTRIN) 200 MG tablet, Take 200 mg by mouth every 6 (six) hours as needed., Disp: , Rfl:  .  norethindrone-ethinyl estradiol (JUNEL FE,GILDESS FE,LOESTRIN FE) 1-20 MG-MCG tablet, Take 1 tablet by mouth daily. (Patient not taking: Reported on 09/17/2018), Disp: 3 Package, Rfl: 4 .  Prenatal Vit-Fe Fumarate-FA (PRENATAL MULTIVITAMIN) TABS tablet, Take 1 tablet by mouth daily at 12 noon., Disp: , Rfl:  Allergies: Nexium [esomeprazole magnesium]  Review of Systems  All other systems reviewed and are negative.  Objective: Ht 5\' 7"  (1.702 m)   LMP 10/16/2018   BMI 26.31 kg/m  There were no vitals filed for this visit. Physical Exam Vitals signs reviewed.   No exam today, due to telephone eVisit due to [facility] virus restriction on elective visits and procedures.  Prior visits reviewed along with ultrasounds/labs as indicated.  Assessment: 1. CIN III (cervical intraepithelial neoplasia grade III) with severe dysplasia   Plan LEEP Meds eRx for one hour before procedure    Norco, Valium, Phenergan  I discussed the assessment and treatment plan with the patient. The patient was provided an opportunity to ask questions and all were answered. The patient agreed with the plan and demonstrated an understanding of the instructions.   The patient was advised to call back or seek an in-person evaluation if the symptoms worsen or if the condition fails to improve as anticipated.  I provided 12 minutes of non-face-to-face time during this encounter.  I have had a careful discussion with this patient about all the options available and the risk/benefits of each.  I have fully informed this patient that surgery may subject her to a variety of discomforts and risks: She understands that most patients have surgery with little difficulty, but problems can happen ranging from minor to fatal. These include nausea,  vomiting, pain, bleeding, infection, poor healing, hernia, or formation of adhesions. Unexpected reactions may occur from any drug or anesthetic given. Unintended injury may occur to other pelvic or abdominal structures such as Fallopian tubes, ovaries, bladder, ureter (tube from kidney to bladder), or bowel. Nerves going from the pelvis to the legs may be injured. Any such injury may require immediate or later additional surgery to correct the problem. Excessive blood loss requiring transfusion is very unlikely but possible. Dangerous blood clots may form in the legs or lungs. Physical and sexual activity will be restricted in varying degrees for an indeterminate period of time but most often 2-6 weeks.  Finally, she understands that it is impossible to list every possible undesirable effect and that the condition for which surgery is done is not always cured or significantly improved, and in rare cases may be even worse.Ample time was given to answer all questions.  Annamarie MajorPaul Toyoko Silos, MD, Merlinda FrederickFACOG [facility], [facility] 11/15/2018  4:48 PM

## 2018-11-20 ENCOUNTER — Other Ambulatory Visit: Payer: Self-pay

## 2018-11-20 ENCOUNTER — Encounter: Payer: Self-pay | Admitting: Obstetrics & Gynecology

## 2018-11-20 ENCOUNTER — Other Ambulatory Visit (HOSPITAL_COMMUNITY)
Admission: RE | Admit: 2018-11-20 | Discharge: 2018-11-20 | Disposition: A | Discharge status: Home / Self Care | Payer: BLUE CROSS/BLUE SHIELD | Source: Ambulatory Visit | Attending: Obstetrics & Gynecology | Admitting: Obstetrics & Gynecology

## 2018-11-20 ENCOUNTER — Ambulatory Visit (INDEPENDENT_AMBULATORY_CARE_PROVIDER_SITE_OTHER): Payer: BLUE CROSS/BLUE SHIELD | Admitting: Obstetrics & Gynecology

## 2018-11-20 VITALS — BP 120/80 | Ht 67.0 in | Wt 168.0 lb

## 2018-11-20 DIAGNOSIS — D069 Carcinoma in situ of cervix, unspecified: Secondary | ICD-10-CM | POA: Diagnosis not present

## 2018-11-20 NOTE — Patient Instructions (Signed)

## 2018-11-20 NOTE — Progress Notes (Signed)
  LEEP PROCEDURE NOTE  AGUS PAP CIN 2-3 on endo/ecto cervical biopsies Normal EMB  The LEEP has been explained to the patient in detail; risks/benefits reviewed.  The risks include, but are not limited to, bleeding, infection, and the possibility of cervical stenosis or cervical incompetence.  The patient had previously been given information regarding abnormal PAP smears and their relationship to HPV.  We have discussed the natural course and history of HPV, the possibility of incomplete treatment by the LEEP, as well as the possibility of recurrence.  I have reviewed the consent form for LEEP with her, and she fully understands its contents.  We have discussed the procedure itself. I have informed her that following the LEEP she should refrain from intercourse and the use of tampons for three weeks, and that she should also expect some spotting and brown/black discharge over the next several days.  We have discussed the fact that vaginal bleeding, differentiated from spotting, is not normal and that if she should have this complication, she should contact me immediately.  The follow-up after LEEP will be PAP smears or viral typing performed at regular intervals for up to 3-5 years.  Should these all prove to be normal, she will then be back on typical cervical screening.  I have answered all of her questions, and I believe she has an adequate understanding of the LEEP, its implications, and the necessity of follow-up care.  I discussed her colpo results and explained the procedure of LEEP.  All questions were answered and she signed the consent form.    LEEP performed in the usual manner after reviewing the previous colpo findings and results. Acetic acid solution was usesd to identify any abnormal areas of the cervix.  The cervix was cleansed with betadine solution. Local injection of Lidocaine was performed for anesthesia. Ectocervical and then endocervical specimens obtained using the loop  electrodes without difficulty.  It was labeled accordingly. The base and edges of the defect was then cauterized using coagulation current.  Annamarie Major, M.D. 11/20/2018 1:30 PM

## 2019-01-14 ENCOUNTER — Telehealth: Payer: Self-pay | Admitting: Internal Medicine

## 2019-01-14 NOTE — Telephone Encounter (Signed)
Called today and I talked with her at 4:50 pm Prior positive Covid Fever free since 6/10, Only symptom in last 3 days was mild cough. And that has resolved. She is feeling well. HD cleared her as her isolation period ended. She received in writing that her period ended 6/21 and it was [facility] as that is where she lives.  Ok to return to work and form completed after reviewed with her the criteria for that and the HD also noted to her was ok to remove herself from isolation.  (Her significant other, a Engineer, structural, was told by the HD that he must remain isolated until early July).

## 2019-02-22 ENCOUNTER — Other Ambulatory Visit (HOSPITAL_COMMUNITY)
Admission: RE | Admit: 2019-02-22 | Discharge: 2019-02-22 | Disposition: A | Discharge status: Home / Self Care | Payer: 59 | Source: Ambulatory Visit | Attending: Obstetrics & Gynecology | Admitting: Obstetrics & Gynecology

## 2019-02-22 ENCOUNTER — Other Ambulatory Visit: Payer: Self-pay

## 2019-02-22 ENCOUNTER — Ambulatory Visit (INDEPENDENT_AMBULATORY_CARE_PROVIDER_SITE_OTHER): Payer: 59 | Admitting: Obstetrics & Gynecology

## 2019-02-22 ENCOUNTER — Encounter: Payer: Self-pay | Admitting: Obstetrics & Gynecology

## 2019-02-22 VITALS — BP 130/74 | HR 89 | Ht 67.0 in | Wt 170.0 lb

## 2019-02-22 DIAGNOSIS — N912 Amenorrhea, unspecified: Secondary | ICD-10-CM

## 2019-02-22 DIAGNOSIS — R87619 Unspecified abnormal cytological findings in specimens from cervix uteri: Secondary | ICD-10-CM

## 2019-02-22 DIAGNOSIS — D069 Carcinoma in situ of cervix, unspecified: Secondary | ICD-10-CM | POA: Insufficient documentation

## 2019-02-22 DIAGNOSIS — Z3202 Encounter for pregnancy test, result negative: Secondary | ICD-10-CM | POA: Diagnosis not present

## 2019-02-22 LAB — POCT URINE PREGNANCY: Preg Test, Ur: NEGATIVE

## 2019-02-22 NOTE — Progress Notes (Signed)
HPI:  Patient is a 23 y.o. G1P1001 presenting for follow up evaluation of abnormal PAP smear in the past.  Her last PAP was 4 months ago and was abnormal: AGUS. She has had a prior colposcopy. Prior biopsies (if done) were CIN III.   This followed by LEEP in April 2020:. Diagnosis 1. Cervix, LEEP, ecto - HIGH GRADE SQUAMOUS INTRAEPITHELIAL LESION, CIN-III (SEVERE DYSPLASIA/CIS). - LOW GRADE SQUAMOUS INTRAEPITHELIAL LESION, CIN-I (MILD DYSPLASIA) FOCALLY INVOLVES ECTOCERVICAL MARGIN. - ENDOCERVICAL AND ECTOCERVICAL MARGINS ARE NEGATIVE FOR HIGH GRADE SQUAMOUS INTRAEPITHELIAL LESION. 2. Endocervix, LEEP - BENIGN ENDOCERVICAL MUCOSA WITH CAUTERY ARTIFACT.  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 Takes OCP daily, no misses  PMHx: She  has no past medical history on file. Also,  has a past surgical history that includes Dental surgery., family history is not on file.,  reports that she has never smoked. She has never used smokeless tobacco. She reports previous alcohol use. She reports that she does not use drugs.  She has a current medication list which includes the following prescription(s): norethindrone-ethinyl estradiol-fe biphas, hydrocortisone, ibuprofen, and norethindrone-ethinyl estradiol. Also, is allergic to nexium [esomeprazole magnesium].  Review of Systems  All other systems reviewed and are negative.  Objective: BP 130/74 (BP Location: Left Arm, Patient Position: Sitting, Cuff Size: Normal)   Pulse 89   Ht 5\' 7"  (1.702 m)   Wt 170 lb (77.1 kg)   BMI 26.63 kg/m  Filed Weights   02/22/19 1008  Weight: 170 lb (77.1 kg)   Body mass index is 26.63 kg/m.  Physical examination Physical Exam Constitutional:      General: She is not in acute distress.    Appearance: She is well-developed.  Genitourinary:     Pelvic exam was performed with  patient supine.     Vagina and uterus normal.     No vaginal erythema or bleeding.     No cervical motion tenderness, discharge, polyp or nabothian cyst.     Uterus is mobile.     Uterus is not enlarged.     No uterine mass detected.    Uterus is midaxial.     No right or left adnexal mass present.     Right adnexa not tender.     Left adnexa not tender.  HENT:     Head: Normocephalic and atraumatic.     Nose: Nose normal.  Abdominal:     General: There is no distension.     Palpations: Abdomen is soft.     Tenderness: There is no abdominal tenderness.  Musculoskeletal: Normal range of motion.  Neurological:     Mental Status: She is alert and oriented to person, place, and time.     Cranial Nerves: No cranial nerve deficit.  Skin:    General: Skin is warm and dry.    Results for orders placed or performed in visit on 02/22/19  POCT urine pregnancy  Result Value Ref Range   Preg Test, Ur Negative Negative    ASSESSMENT:  History of Cervical Dysplasia 1. Amenorrhea - POCT urine pregnancy  2. CIN III (cervical intraepithelial neoplasia grade III) with severe dysplasia - Cytology - PAP follow up today  3. Atypical glandular cells of undetermined significance (AGUS) on cervical Pap smear   Plan:  1.  I discussed the grading system of pap smears and HPV high risk viral  types.   2. Follow up PAP 6 months, vs intervention if high grade dysplasia identified. 3. Monitor for s/sx pregnancy although neg test reassuring that she is not pregnant.  Amenorrhea secondary to recent stress and work.  Also on OCP that may suppress periods.  Barnett Applebaum, MD, [facility], [facility] 02/22/2019  10:26 AM

## 2019-02-25 LAB — CYTOLOGY - PAP: Diagnosis: UNDETERMINED — AB

## 2019-02-26 ENCOUNTER — Ambulatory Visit
Admission: RE | Admit: 2019-02-26 | Discharge: 2019-02-26 | Disposition: A | Discharge status: Home / Self Care | Payer: No Typology Code available for payment source | Source: Ambulatory Visit | Attending: Internal Medicine | Admitting: Internal Medicine

## 2019-02-26 ENCOUNTER — Ambulatory Visit: Payer: Self-pay | Admitting: Internal Medicine

## 2019-02-26 ENCOUNTER — Encounter: Payer: Self-pay | Admitting: Internal Medicine

## 2019-02-26 ENCOUNTER — Other Ambulatory Visit: Payer: Self-pay

## 2019-02-26 VITALS — BP 117/74 | HR 107 | Temp 99.4°F | Resp 14 | Ht 67.0 in | Wt 190.0 lb

## 2019-02-26 DIAGNOSIS — M79601 Pain in right arm: Secondary | ICD-10-CM

## 2019-02-26 DIAGNOSIS — M25531 Pain in right wrist: Secondary | ICD-10-CM

## 2019-02-26 DIAGNOSIS — M79631 Pain in right forearm: Secondary | ICD-10-CM | POA: Diagnosis not present

## 2019-02-26 DIAGNOSIS — M7989 Other specified soft tissue disorders: Secondary | ICD-10-CM | POA: Diagnosis not present
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
## 2019-03-05 ENCOUNTER — Other Ambulatory Visit: Payer: Self-pay

## 2019-03-05 ENCOUNTER — Encounter: Payer: Self-pay | Admitting: Internal Medicine

## 2019-03-05 ENCOUNTER — Ambulatory Visit: Payer: 59 | Admitting: Internal Medicine

## 2019-03-05 VITALS — BP 121/77 | HR 88 | Temp 97.6°F | Resp 14 | Ht 67.0 in | Wt 189.0 lb

## 2019-03-05 DIAGNOSIS — S66911D Strain of unspecified muscle, fascia and tendon at wrist and hand level, right hand, subsequent encounter: Secondary | ICD-10-CM
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
## 2019-04-23 ENCOUNTER — Ambulatory Visit: Payer: Self-pay

## 2019-04-23 DIAGNOSIS — Z23 Encounter for immunization: Secondary | ICD-10-CM

## 2019-06-06 DIAGNOSIS — Z03818 Encounter for observation for suspected exposure to other biological agents ruled out: Secondary | ICD-10-CM | POA: Diagnosis not present

## 2019-06-07 DIAGNOSIS — J00 Acute nasopharyngitis [common cold]: Secondary | ICD-10-CM | POA: Diagnosis not present

## 2019-07-30 ENCOUNTER — Ambulatory Visit (INDEPENDENT_AMBULATORY_CARE_PROVIDER_SITE_OTHER): Payer: Managed Care, Other (non HMO) | Admitting: Obstetrics & Gynecology

## 2019-07-30 ENCOUNTER — Encounter: Payer: Self-pay | Admitting: Obstetrics & Gynecology

## 2019-07-30 ENCOUNTER — Other Ambulatory Visit: Payer: Self-pay

## 2019-07-30 VITALS — Ht 67.0 in | Wt 165.0 lb

## 2019-07-30 DIAGNOSIS — N946 Dysmenorrhea, unspecified: Secondary | ICD-10-CM | POA: Diagnosis not present

## 2019-07-30 DIAGNOSIS — N926 Irregular menstruation, unspecified: Secondary | ICD-10-CM | POA: Diagnosis not present

## 2019-07-30 DIAGNOSIS — R6882 Decreased libido: Secondary | ICD-10-CM | POA: Diagnosis not present

## 2019-07-30 DIAGNOSIS — N943 Premenstrual tension syndrome: Secondary | ICD-10-CM | POA: Diagnosis not present

## 2019-07-30 DIAGNOSIS — R69 Illness, unspecified: Secondary | ICD-10-CM | POA: Diagnosis not present

## 2019-07-30 NOTE — Progress Notes (Signed)
Virtual Visit via Telephone Note  I connected with Jennifer Nelson on 07/30/19 at 11:20 AM EST by telephone and verified that I am speaking with the correct person using two identifiers.   I discussed the limitations, risks, security and privacy concerns of performing an evaluation and management service by telephone and the availability of in person appointments. I also discussed with the patient that there may be a patient responsible charge related to this service. The patient expressed understanding and agreed to proceed. She was at home and I was in my office.  History of Present Illness: Patient presents for contraception counseling. The patient has complaints today of mood changes, BTB, decreased libido, and pain w cycles while on the OCP for the last year.  She finally stopped them 5 days ago. The patient is sexually active. Pertinent past medical history: none.  Prior NSVD 11 mos ago.  Plans future pregnancy but not for a while.  Desires non-hormonal option (had similar sx's on Depo in years past).  PMHx: She  has no past medical history on file. Also,  has a past surgical history that includes Dental surgery., family history includes Asthma in her brother; Diabetes in her maternal grandmother; Glaucoma in her father; Luiz Blare' disease in her mother; Hypertension in her father.,  reports that she has never smoked. She has never used smokeless tobacco. She reports previous alcohol use. She reports that she does not use drugs.  She has a current medication list which includes the following prescription(s): multiple vitamins-minerals and norethindrone-ethinyl estradiol-fe biphas. Also, is allergic to nexium [esomeprazole magnesium].  Review of Systems  Constitutional: Negative for chills, fever and malaise/fatigue.  HENT: Negative for congestion, sinus pain and sore throat.   Eyes: Negative for blurred vision and pain.  Respiratory: Negative for cough and wheezing.   Cardiovascular:  Negative for chest pain and leg swelling.  Gastrointestinal: Negative for abdominal pain, constipation, diarrhea, heartburn, nausea and vomiting.  Genitourinary: Negative for dysuria, frequency, hematuria and urgency.  Musculoskeletal: Negative for back pain, joint pain, myalgias and neck pain.  Skin: Negative for itching and rash.  Neurological: Negative for dizziness, tremors and weakness.  Endo/Heme/Allergies: Does not bruise/bleed easily.  Psychiatric/Behavioral: Negative for depression. The patient is not nervous/anxious and does not have insomnia.    Observations/Objective: No exam today, due to telephone eVisit due to [facility] virus restriction on elective visits and procedures.  Prior visits reviewed along with ultrasounds/labs as indicated.  Assessment and Plan:   ICD-10-CM   1. PMS (premenstrual syndrome)  N94.3   2. Irregular bleeding  N92.6   3. Decreased libido  R68.82   4. Dysmenorrhea  N94.6    Follow Up Instructions: 1 month for IUD placement (Paraguard) At time of her repeat PAP visit 09/02/2019  Birth Control I discussed multiple birth control options and methods with the patient.  The risks and benefits of each were reviewed.  The possible side effects including deep venous thrombosis, breast tenderness, fluid retention, mood changes and abnormal vaginal bleeding were discussed.  Combination as well as progesterone-only options, pros and cons counseled.  Info on IUD placed in MyChart instructions.   I discussed the assessment and treatment plan with the patient. The patient was provided an opportunity to ask questions and all were answered. The patient agreed with the plan and demonstrated an understanding of the instructions.   The patient was advised to call back or seek an in-person evaluation if the symptoms worsen or if the condition fails to  improve as anticipated.  I provided 15 minutes of non-face-to-face time during this encounter.   Hoyt Koch,  MD

## 2019-07-30 NOTE — Patient Instructions (Signed)

## 2019-08-26 ENCOUNTER — Ambulatory Visit: Payer: 59 | Admitting: Obstetrics & Gynecology

## 2019-08-28 ENCOUNTER — Ambulatory Visit: Payer: 59 | Admitting: Obstetrics & Gynecology

## 2019-09-02 ENCOUNTER — Encounter: Payer: Self-pay | Admitting: Obstetrics & Gynecology

## 2019-09-02 ENCOUNTER — Other Ambulatory Visit: Payer: Self-pay

## 2019-09-02 ENCOUNTER — Ambulatory Visit (INDEPENDENT_AMBULATORY_CARE_PROVIDER_SITE_OTHER): Payer: Managed Care, Other (non HMO) | Admitting: Obstetrics & Gynecology

## 2019-09-02 VITALS — BP 120/80 | Ht 67.0 in | Wt 165.0 lb

## 2019-09-02 DIAGNOSIS — Z30430 Encounter for insertion of intrauterine contraceptive device: Secondary | ICD-10-CM

## 2019-09-02 NOTE — Progress Notes (Addendum)
  IUD PROCEDURE NOTE:  Jennifer Nelson is a 24 y.o. G1P1001 here for IUD insertion. No GYN concerns.  Last pap smear was ASCUS, being monitored.  IUD Insertion Procedure Note Patient identified, informed consent performed, consent signed.   Discussed risks of irregular bleeding, cramping, infection, malpositioning or misplacement of the IUD outside the uterus which may require further procedure such as laparoscopy, risk of failure <1%. Time out was performed.  Urine pregnancy test negative.  A bimanual exam showed the uterus to be midposition.  Speculum placed in the vagina.  Cervix visualized.  Cleaned with Betadine x 2.  Grasped anteriorly with a single tooth tenaculum.  Uterus sounded to 7 cm.   Paragard IUD placed per manufacturer's recommendations.  Strings trimmed to 3 cm. Tenaculum was removed, good hemostasis noted.  Patient tolerated procedure well.   Patient was given post-procedure instructions.  She was advised to have backup contraception for one week.  Patient was also asked to check IUD strings periodically and follow up in 4 weeks for IUD check. PAP due as well.  Annamarie Major, MD, [facility], [facility], [facility] 09/02/2019  11:04 AM

## 2019-09-02 NOTE — Patient Instructions (Signed)
Intrauterine Device Insertion, Care After  This sheet gives you information about how to care for yourself after your procedure. Your health care provider may also give you more specific instructions. If you have problems or questions, contact your health care provider. What can I expect after the procedure? After the procedure, it is common to have:  Cramps and pain in the abdomen.  Light bleeding (spotting) or heavier bleeding that is like your menstrual period. This may last for up to a few days.  Lower back pain.  Dizziness.  Headaches.  Nausea. Follow these instructions at home:  Before resuming sexual activity, check to make sure that you can feel the IUD string(s). You should be able to feel the end of the string(s) below the opening of your cervix. If your IUD string is in place, you may resume sexual activity. ? If you had a hormonal IUD inserted more than 7 days after your most recent period started, you will need to use a backup method of birth control for 7 days after IUD insertion. Ask your health care provider whether this applies to you.  Continue to check that the IUD is still in place by feeling for the string(s) after every menstrual period, or once a month.  Take over-the-counter and prescription medicines only as told by your health care provider.  Do not drive or use heavy machinery while taking prescription pain medicine.  Keep all follow-up visits as told by your health care provider. This is important. Contact a health care provider if:  You have bleeding that is heavier or lasts longer than a normal menstrual cycle.  You have a fever.  You have cramps or abdominal pain that get worse or do not get better with medicine.  You develop abdominal pain that is new or is not in the same area of earlier cramping and pain.  You feel lightheaded or weak.  You have abnormal or bad-smelling discharge from your vagina.  You have pain during sexual  activity.  You have any of the following problems with your IUD string(s): ? The string bothers or hurts you or your sexual partner. ? You cannot feel the string. ? The string has gotten longer.  You can feel the IUD in your vagina.  You think you may be pregnant, or you miss your menstrual period.  You think you may have an STI (sexually transmitted infection). Get help right away if:  You have flu-like symptoms.  You have a fever and chills.  You can feel that your IUD has slipped out of place. Summary  After the procedure, it is common to have cramps and pain in the abdomen. It is also common to have light bleeding (spotting) or heavier bleeding that is like your menstrual period.  Continue to check that the IUD is still in place by feeling for the string(s) after every menstrual period, or once a month.  Keep all follow-up visits as told by your health care provider. This is important.  Contact your health care provider if you have problems with your IUD string(s), such as the string getting longer or bothering you or your sexual partner. This information is not intended to replace advice given to you by your health care provider. Make sure you discuss any questions you have with your health care provider. Document Revised: 06/23/2017 Document Reviewed: 06/01/2016 Elsevier Patient Education  2020 Elsevier Inc.  

## 2019-09-30 ENCOUNTER — Other Ambulatory Visit: Payer: Self-pay

## 2019-09-30 ENCOUNTER — Encounter: Payer: Self-pay | Admitting: Obstetrics & Gynecology

## 2019-09-30 ENCOUNTER — Ambulatory Visit (INDEPENDENT_AMBULATORY_CARE_PROVIDER_SITE_OTHER): Payer: 59 | Admitting: Obstetrics & Gynecology

## 2019-09-30 ENCOUNTER — Other Ambulatory Visit (HOSPITAL_COMMUNITY)
Admission: RE | Admit: 2019-09-30 | Discharge: 2019-09-30 | Disposition: A | Discharge status: Home / Self Care | Payer: 59 | Source: Ambulatory Visit | Attending: Obstetrics & Gynecology | Admitting: Obstetrics & Gynecology

## 2019-09-30 VITALS — BP 120/80 | Ht 67.0 in | Wt 168.0 lb

## 2019-09-30 DIAGNOSIS — D069 Carcinoma in situ of cervix, unspecified: Secondary | ICD-10-CM | POA: Insufficient documentation

## 2019-09-30 DIAGNOSIS — Z975 Presence of (intrauterine) contraceptive device: Secondary | ICD-10-CM | POA: Diagnosis not present

## 2019-09-30 NOTE — Progress Notes (Signed)
HPI:  Patient is a 24 y.o. G1P1001 presenting for follow up evaluation of abnormal PAP smear in the past.  Her last PAP was 8 months ago and was abnormal: ASCUS.  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 She has had a prior colposcopy. Prior biopsies (if done) were CIN III.  Also presents for IUD f/u, w Paraguard placed one month ago, no complaints.  She had had one period, somewhat prolonged.  No dyspareunia.   Plans pregnancy in 2 years.  PMHx: She  has no past medical history on file. Also,  has a past surgical history that includes Dental surgery., family history includes Asthma in her brother; Diabetes in her maternal grandmother; Glaucoma in her father; Luiz Blare' disease in her mother; Hypertension in her father.,  reports that she has never smoked. She has never used smokeless tobacco. She reports previous alcohol use. She reports that she does not use drugs.  She has a current medication list which includes the following prescription(s): multiple vitamins-minerals. Also, is allergic to nexium [esomeprazole magnesium].  Review of Systems  All other systems reviewed and are negative.   Objective: BP 120/80   Ht 5\' 7"  (1.702 m)   Wt 168 lb (76.2 kg)   LMP 09/21/2019   BMI 26.31 kg/m  Filed Weights   09/30/19 1101  Weight: 168 lb (76.2 kg)   Body mass index is 26.31 kg/m.  Physical examination Physical Exam Constitutional:      General: She is not in acute distress.    Appearance: She is well-developed.  Genitourinary:     Pelvic exam was performed with patient supine.     Vagina and uterus normal.     No vaginal erythema or bleeding.     No cervical motion tenderness, discharge, polyp or nabothian cyst.     IUD strings visualized.     Uterus is mobile.     Uterus is not enlarged.     No uterine mass detected.    Uterus is midaxial.     No right or left adnexal mass present.     Right adnexa not tender.     Left adnexa not tender.  HENT:     Head: Normocephalic and  atraumatic.     Nose: Nose normal.  Abdominal:     General: There is no distension.     Palpations: Abdomen is soft.     Tenderness: There is no abdominal tenderness.  Musculoskeletal:        General: Normal range of motion.  Neurological:     Mental Status: She is alert and oriented to person, place, and time.     Cranial Nerves: No cranial nerve deficit.  Skin:    General: Skin is warm and dry.  Psychiatric:        Attention and Perception: Attention normal.        Mood and Affect: Mood and affect normal.        Speech: Speech normal.        Behavior: Behavior normal.        Thought Content: Thought content normal.        Judgment: Judgment normal.     ASSESSMENT:  History of Cervical Dysplasia  Plan:  1.  I discussed the grading system of pap smears and HPV high risk viral types.   2. Follow up PAP 6 months, vs intervention if high grade dysplasia identified. 3. IUD without neg side effect and normal exam today (Paraguard)  A total of 20  minutes were spent face-to-face with the patient as well as preparation, review, communication, and documentation during this encounter.    Barnett Applebaum, MD, [facility], [facility] 09/30/2019  11:33 AM

## 2019-10-02 LAB — CYTOLOGY - PAP: Diagnosis: NEGATIVE

## 2019-11-05 ENCOUNTER — Ambulatory Visit: Payer: Self-pay

## 2019-11-05 DIAGNOSIS — Z20828 Contact with and (suspected) exposure to other viral communicable diseases: Secondary | ICD-10-CM | POA: Diagnosis not present

## 2019-11-07 ENCOUNTER — Encounter: Payer: Self-pay | Admitting: Physician Assistant

## 2019-11-07 ENCOUNTER — Ambulatory Visit: Payer: Self-pay | Admitting: Physician Assistant

## 2019-11-07 ENCOUNTER — Ambulatory Visit
Admission: RE | Admit: 2019-11-07 | Discharge: 2019-11-07 | Disposition: A | Discharge status: Home / Self Care | Payer: 59 | Attending: Physician Assistant | Admitting: Physician Assistant

## 2019-11-07 ENCOUNTER — Ambulatory Visit
Admission: RE | Admit: 2019-11-07 | Discharge: 2019-11-07 | Disposition: A | Discharge status: Home / Self Care | Payer: 59 | Source: Ambulatory Visit | Attending: Physician Assistant | Admitting: Physician Assistant

## 2019-11-07 ENCOUNTER — Other Ambulatory Visit: Payer: Self-pay | Admitting: Physician Assistant

## 2019-11-07 ENCOUNTER — Other Ambulatory Visit: Payer: Self-pay

## 2019-11-07 VITALS — BP 102/70 | HR 80 | Temp 98.5°F | Resp 12 | Ht 66.0 in | Wt 162.0 lb

## 2019-11-07 DIAGNOSIS — M79675 Pain in left toe(s): Secondary | ICD-10-CM

## 2019-11-07 DIAGNOSIS — M7989 Other specified soft tissue disorders: Secondary | ICD-10-CM | POA: Diagnosis not present

## 2019-11-07 MED ORDER — LIDOCAINE 5 % EX PTCH: 1.0000 | MEDICATED_PATCH | CUTANEOUS | 0 refills | Status: DC

## 2019-11-07 NOTE — Progress Notes (Addendum)
?   Bunion at base of left great toe - noticed on Sunday. Slightly reddedned & has some pain with ambulation, but not intolerable Denies eating seafood and/or beer intake.  States had sinus ifection last week & only used Zyrtec & Nasal spray for it.  No ABX.  AMD

## 2019-11-07 NOTE — Progress Notes (Signed)
   Subjective: Left toe pain    Patient ID: Jennifer Nelson, female    DOB: Sep 11, 1995, 24 y.o.   MRN: [id number]  HPI Patient presents with nonprovocative left great toe pain for 3 days.  Patient state swelling to the dorsal aspect of the left great toe.  Patient state edema has decreased today.  Area is tender to palpation.  No appreciated erythema.   Review of Systems    Negative except for complaint Objective:   Physical Exam  No acute distress.  Edema and erythema to the dorsal aspect of the left toe.  Patient has early bunion deformity bilateral great toe.      Assessment & Plan:  Differentials consist of early arthritic changes, gout, or cysts.  We will get x-ray of the left great toe.  We will also obtain a uric acid, CBC, and sed rate.  Patient placed on light duty and advised she will comfort pain x-ray and lab results.  Patient will follow up on 11/12/2019.

## 2019-11-08 LAB — SEDIMENTATION RATE: Sed Rate: 2 mm/hr (ref 0–32)

## 2019-11-08 LAB — CBC WITH DIFFERENTIAL/PLATELET
Basophils Absolute: 0 10*3/uL (ref 0.0–0.2)
Basos: 0 %
EOS (ABSOLUTE): 0.2 10*3/uL (ref 0.0–0.4)
Eos: 3 %
Hematocrit: 41.4 % (ref 34.0–46.6)
Hemoglobin: 13.9 g/dL (ref 11.1–15.9)
Immature Grans (Abs): 0 10*3/uL (ref 0.0–0.1)
Immature Granulocytes: 0 %
Lymphocytes Absolute: 1.8 10*3/uL (ref 0.7–3.1)
Lymphs: 33 %
MCH: 31.2 pg (ref 26.6–33.0)
MCHC: 33.6 g/dL (ref 31.5–35.7)
MCV: 93 fL (ref 79–97)
Monocytes Absolute: 0.4 10*3/uL (ref 0.1–0.9)
Monocytes: 8 %
Neutrophils Absolute: 3 10*3/uL (ref 1.4–7.0)
Neutrophils: 56 %
Platelets: 351 10*3/uL (ref 150–450)
RBC: 4.45 x10E6/uL (ref 3.77–5.28)
RDW: 11.7 % (ref 11.7–15.4)
WBC: 5.4 10*3/uL (ref 3.4–10.8)

## 2019-11-08 LAB — URIC ACID: Uric Acid: 3.8 mg/dL (ref 2.6–6.2)

## 2019-11-11 ENCOUNTER — Encounter: Payer: Self-pay | Admitting: Emergency Medicine

## 2019-11-11 ENCOUNTER — Other Ambulatory Visit: Payer: Self-pay

## 2019-11-11 ENCOUNTER — Ambulatory Visit: Payer: Self-pay | Admitting: Emergency Medicine

## 2019-11-11 VITALS — BP 123/76 | HR 68 | Temp 98.1°F | Resp 12

## 2019-11-11 DIAGNOSIS — M2012 Hallux valgus (acquired), left foot: Secondary | ICD-10-CM

## 2019-11-11 NOTE — Progress Notes (Signed)
S: Returns for lab results and also x-ray of her left great toe.  Patient states that she has continued to wear comfortable shoes without any difficulty.  Patient works for the [employer] and is required to wear special shoes.  O: Left great MP joint with soft tissue edema and minimal discoloration was noted.  Skin is intact.  Minimally tender to touch.  A: Mild hallux valgus deformity left great toe.    P: Lab work is discussed and patient was made aware that lab work for gout was negative.  X-rays were discussed and patient was made aware that this is a early bunion.  Patient states that she is able to wear her work shoes without problems.  We did discuss some cushions that can be purchased in the foot all of any store such as [employer], [employer], [employer].  Should she have problems with work or the area become larger will refer to a podiatrist. Released for regular duty. NSAIDS prn.

## 2019-12-06 NOTE — Progress Notes (Signed)
Scheduled to complete physical 12/18/19 with Dr. Williams.  AMD 

## 2019-12-09 ENCOUNTER — Other Ambulatory Visit: Payer: Self-pay

## 2019-12-09 ENCOUNTER — Ambulatory Visit: Payer: Self-pay

## 2019-12-09 DIAGNOSIS — Z0000 Encounter for general adult medical examination without abnormal findings: Secondary | ICD-10-CM

## 2019-12-09 LAB — POCT URINALYSIS DIPSTICK
Bilirubin, UA: NEGATIVE
Blood, UA: NEGATIVE
Glucose, UA: NEGATIVE
Ketones, UA: NEGATIVE
Leukocytes, UA: NEGATIVE
Nitrite, UA: NEGATIVE
Protein, UA: NEGATIVE
Spec Grav, UA: 1.01 (ref 1.010–1.025)
Urobilinogen, UA: 0.2 E.U./dL
pH, UA: 6 (ref 5.0–8.0)

## 2019-12-10 LAB — CMP12+LP+TP+TSH+6AC+CBC/D/PLT
ALT: 12 IU/L (ref 0–32)
AST: 17 IU/L (ref 0–40)
Albumin/Globulin Ratio: 1.6 (ref 1.2–2.2)
Albumin: 4.4 g/dL (ref 3.9–5.0)
Alkaline Phosphatase: 56 IU/L (ref 48–121)
BUN/Creatinine Ratio: 16 (ref 9–23)
BUN: 11 mg/dL (ref 6–20)
Basophils Absolute: 0 10*3/uL (ref 0.0–0.2)
Basos: 1 %
Bilirubin Total: 0.3 mg/dL (ref 0.0–1.2)
Calcium: 9.2 mg/dL (ref 8.7–10.2)
Chloride: 104 mmol/L (ref 96–106)
Chol/HDL Ratio: 3.1 ratio (ref 0.0–4.4)
Cholesterol, Total: 199 mg/dL (ref 100–199)
Creatinine, Ser: 0.68 mg/dL (ref 0.57–1.00)
EOS (ABSOLUTE): 0.1 10*3/uL (ref 0.0–0.4)
Eos: 2 %
Estimated CHD Risk: <0.5 times avg. (ref 0.0–1.0)
Free Thyroxine Index: 1.5 (ref 1.2–4.9)
GFR calc Af Amer: 140 mL/min/{1.73_m2} (ref >59)
GFR calc non Af Amer: 121 mL/min/{1.73_m2} (ref >59)
GGT: 10 IU/L (ref 0–60)
Globulin, Total: 2.7 g/dL (ref 1.5–4.5)
Glucose: 94 mg/dL (ref 65–99)
HDL: 64 mg/dL (ref >39)
Hematocrit: 38.9 % (ref 34.0–46.6)
Hemoglobin: 13.8 g/dL (ref 11.1–15.9)
Immature Grans (Abs): 0 10*3/uL (ref 0.0–0.1)
Immature Granulocytes: 0 %
Iron: 107 ug/dL (ref 27–159)
LDH: 146 IU/L (ref 119–226)
LDL Chol Calc (NIH): 115 mg/dL — ABNORMAL HIGH (ref 0–99)
Lymphocytes Absolute: 1.7 10*3/uL (ref 0.7–3.1)
Lymphs: 32 %
MCH: 33.2 pg — ABNORMAL HIGH (ref 26.6–33.0)
MCHC: 35.5 g/dL (ref 31.5–35.7)
MCV: 94 fL (ref 79–97)
Monocytes Absolute: 0.4 10*3/uL (ref 0.1–0.9)
Monocytes: 7 %
Neutrophils Absolute: 3.1 10*3/uL (ref 1.4–7.0)
Neutrophils: 58 %
Phosphorus: 3.4 mg/dL (ref 3.0–4.3)
Platelets: 339 10*3/uL (ref 150–450)
Potassium: 4.2 mmol/L (ref 3.5–5.2)
RBC: 4.16 x10E6/uL (ref 3.77–5.28)
RDW: 11.6 % — ABNORMAL LOW (ref 11.7–15.4)
Sodium: 137 mmol/L (ref 134–144)
T3 Uptake Ratio: 25 % (ref 24–39)
T4, Total: 5.9 ug/dL (ref 4.5–12.0)
TSH: 2.29 u[IU]/mL (ref 0.450–4.500)
Total Protein: 7.1 g/dL (ref 6.0–8.5)
Triglycerides: 114 mg/dL (ref 0–149)
Uric Acid: 3.4 mg/dL (ref 2.6–6.2)
VLDL Cholesterol Cal: 20 mg/dL (ref 5–40)
WBC: 5.4 10*3/uL (ref 3.4–10.8)

## 2019-12-18 ENCOUNTER — Ambulatory Visit: Payer: Self-pay | Admitting: Emergency Medicine

## 2019-12-18 ENCOUNTER — Encounter: Payer: Self-pay | Admitting: Emergency Medicine

## 2019-12-18 ENCOUNTER — Other Ambulatory Visit: Payer: Self-pay

## 2019-12-18 VITALS — BP 116/73 | HR 90 | Temp 99.1°F | Resp 14 | Ht 67.0 in | Wt 161.0 lb

## 2019-12-18 DIAGNOSIS — Z0000 Encounter for general adult medical examination without abnormal findings: Secondary | ICD-10-CM

## 2019-12-18 NOTE — Progress Notes (Signed)
[facility] Provider Note       Time seen: 8:28 AM    I have reviewed the vital signs and the nursing notes.  HISTORY   Chief Complaint Employment Physical    HPI Jennifer Nelson is a 24 y.o. female with no significant past medical history who presents today for annual physical examination.  Patient denies any complaints.  Occasionally has trouble sleeping but this is caused by her significant other working nights.  She states she is talking to her therapist about this.  She denies any recent illness.  No past medical history on file.  Past Surgical History:  Procedure Laterality Date  . DENTAL SURGERY      Allergies Nexium [esomeprazole magnesium]   Review of Systems Constitutional: Negative for fever. Cardiovascular: Negative for chest pain. Respiratory: Negative for shortness of breath. Gastrointestinal: Negative for abdominal pain, vomiting and diarrhea. Musculoskeletal: Negative for back pain. Skin: Negative for rash. Neurological: Negative for headaches, focal weakness or numbness.  All systems negative/normal/unremarkable except as stated in the HPI  ____________________________________________   PHYSICAL EXAM:  VITAL SIGNS: Vitals:   12/18/19 0822  BP: 116/73  Pulse: 90  Resp: 14  Temp: 99.1 F (37.3 C)  SpO2: 97%    Constitutional: Alert and oriented. Well appearing and in no distress. Eyes: Conjunctivae are normal. Normal extraocular movements. ENT      Head: Normocephalic and atraumatic.      Nose: No congestion/rhinnorhea.      Mouth/Throat: Mucous membranes are moist.      Neck: No stridor. Cardiovascular: Normal rate, regular rhythm. No murmurs, rubs, or gallops. Respiratory: Normal respiratory effort without tachypnea nor retractions. Breath sounds are clear and equal bilaterally. No wheezes/rales/rhonchi. Gastrointestinal: Soft and nontender. Normal bowel sounds Musculoskeletal: Nontender with normal  range of motion in extremities. No lower extremity tenderness nor edema. Neurologic:  Normal speech and language. No gross focal neurologic deficits are appreciated.  Skin:  Skin is warm, dry and intact. No rash noted. Psychiatric: Speech and behavior are normal.  ____________________________________________   LABS (pertinent positives/negatives)  Recent Results (from the past 2160 hour(s))  Cytology - PAP     Status: None   Collection Time: 09/30/19 11:32 AM  Result Value Ref Range   Adequacy      Satisfactory for evaluation; transformation zone component PRESENT.   Diagnosis      - Negative for intraepithelial lesion or malignancy (NILM)  Uric acid     Status: None   Collection Time: 11/07/19 10:59 AM  Result Value Ref Range   Uric Acid 3.8 2.6 - 6.2 mg/dL    Comment:            Therapeutic target for gout patients: <6.0  Sedimentation rate     Status: None   Collection Time: 11/07/19 10:59 AM  Result Value Ref Range   Sed Rate 2 0 - 32 mm/hr  CBC with Differential     Status: None   Collection Time: 11/07/19 10:59 AM  Result Value Ref Range   WBC 5.4 3.4 - 10.8 x10E3/uL   RBC 4.45 3.77 - 5.28 x10E6/uL   Hemoglobin 13.9 11.1 - 15.9 g/dL   Hematocrit 41.4 34.0 - 46.6 %   MCV 93 79 - 97 fL   MCH 31.2 26.6 - 33.0 pg   MCHC 33.6 31.5 - 35.7 g/dL   RDW 11.7 11.7 - 15.4 %   Platelets 351 150 - 450 x10E3/uL   Neutrophils 56 Not  Estab. %   Lymphs 33 Not Estab. %   Monocytes 8 Not Estab. %   Eos 3 Not Estab. %   Basos 0 Not Estab. %   Neutrophils Absolute 3.0 1.4 - 7.0 x10E3/uL   Lymphocytes Absolute 1.8 0.7 - 3.1 x10E3/uL   Monocytes Absolute 0.4 0.1 - 0.9 x10E3/uL   EOS (ABSOLUTE) 0.2 0.0 - 0.4 x10E3/uL   Basophils Absolute 0.0 0.0 - 0.2 x10E3/uL   Immature Granulocytes 0 Not Estab. %   Immature Grans (Abs) 0.0 0.0 - 0.1 x10E3/uL  Executive Panel     Status: Abnormal   Collection Time: 12/09/19  8:38 AM  Result Value Ref Range   Glucose 94 65 - 99 mg/dL   Uric Acid 3.4  2.6 - 6.2 mg/dL    Comment:            Therapeutic target for gout patients: <6.0   BUN 11 6 - 20 mg/dL   Creatinine, Ser 0.68 0.57 - 1.00 mg/dL   GFR calc non Af Amer 121 >59 mL/min/1.73   GFR calc Af Amer 140 >59 mL/min/1.73    Comment: **[facility] currently reports eGFR in compliance with the current**   recommendations of the [employer]. [facility] will   update reporting as new guidelines are published from the NKF-ASN   Task force.    BUN/Creatinine Ratio 16 9 - 23   Sodium 137 134 - 144 mmol/L   Potassium 4.2 3.5 - 5.2 mmol/L   Chloride 104 96 - 106 mmol/L   Calcium 9.2 8.7 - 10.2 mg/dL   Phosphorus 3.4 3.0 - 4.3 mg/dL   Total Protein 7.1 6.0 - 8.5 g/dL   Albumin 4.4 3.9 - 5.0 g/dL   Globulin, Total 2.7 1.5 - 4.5 g/dL   Albumin/Globulin Ratio 1.6 1.2 - 2.2   Bilirubin Total 0.3 0.0 - 1.2 mg/dL   Alkaline Phosphatase 56 48 - 121 IU/L    Comment:               **Please note reference interval change**   LDH 146 119 - 226 IU/L   AST 17 0 - 40 IU/L   ALT 12 0 - 32 IU/L   GGT 10 0 - 60 IU/L   Iron 107 27 - 159 ug/dL   Cholesterol, Total 199 100 - 199 mg/dL   Triglycerides 114 0 - 149 mg/dL   HDL 64 >39 mg/dL   VLDL Cholesterol Cal 20 5 - 40 mg/dL   LDL Chol Calc (NIH) 115 (H) 0 - 99 mg/dL   Chol/HDL Ratio 3.1 0.0 - 4.4 ratio    Comment:                                   T. Chol/HDL Ratio                                             Men  Women                               1/2 Avg.Risk  3.4    3.3  Avg.Risk  5.0    4.4                                2X Avg.Risk  9.6    7.1                                3X Avg.Risk 23.4   11.0    Estimated CHD Risk  < 0.5 0.0 - 1.0 times avg.    Comment: The CHD Risk is based on the T. Chol/HDL ratio. Other factors affect CHD Risk such as hypertension, smoking, diabetes, severe obesity, and family history of premature CHD.    TSH 2.290 0.450 - 4.500 uIU/mL   T4, Total 5.9 4.5 - 12.0 ug/dL    T3 Uptake Ratio 25 24 - 39 %   Free Thyroxine Index 1.5 1.2 - 4.9   WBC 5.4 3.4 - 10.8 x10E3/uL   RBC 4.16 3.77 - 5.28 x10E6/uL   Hemoglobin 13.8 11.1 - 15.9 g/dL   Hematocrit 38.9 34.0 - 46.6 %   MCV 94 79 - 97 fL   MCH 33.2 (H) 26.6 - 33.0 pg   MCHC 35.5 31.5 - 35.7 g/dL   RDW 11.6 (L) 11.7 - 15.4 %   Platelets 339 150 - 450 x10E3/uL   Neutrophils 58 Not Estab. %   Lymphs 32 Not Estab. %   Monocytes 7 Not Estab. %   Eos 2 Not Estab. %   Basos 1 Not Estab. %   Neutrophils Absolute 3.1 1.4 - 7.0 x10E3/uL   Lymphocytes Absolute 1.7 0.7 - 3.1 x10E3/uL   Monocytes Absolute 0.4 0.1 - 0.9 x10E3/uL   EOS (ABSOLUTE) 0.1 0.0 - 0.4 x10E3/uL   Basophils Absolute 0.0 0.0 - 0.2 x10E3/uL   Immature Granulocytes 0 Not Estab. %   Immature Grans (Abs) 0.0 0.0 - 0.1 x10E3/uL  POCT Urinalysis Dipstick (CPT 81002)     Status: None   Collection Time: 12/09/19  8:49 AM  Result Value Ref Range   Color, UA Light Yellow    Clarity, UA Clear    Glucose, UA Negative Negative   Bilirubin, UA Negative    Ketones, UA Negative    Spec Grav, UA 1.010 1.010 - 1.025   Blood, UA Negative    pH, UA 6.0 5.0 - 8.0   Protein, UA Negative Negative   Urobilinogen, UA 0.2 0.2 or 1.0 E.U./dL   Nitrite, UA Negative    Leukocytes, UA Negative Negative   Appearance     Odor      DIFFERENTIAL DIAGNOSIS  Annual physical examination  ASSESSMENT AND PLAN  Annual physical examination   Plan: The patient had presented for her annual physical examination.  Her labs are unremarkable and she overall appears well.  She is cleared for follow-up as needed.  Lenise Arena MD    Note: This note was generated in part or whole with voice recognition software. Voice recognition is usually quite accurate but there are transcription errors that can and very often do occur. I apologize for any typographical errors that were not detected and corrected.

## 2019-12-26 DIAGNOSIS — R5383 Other fatigue: Secondary | ICD-10-CM | POA: Diagnosis not present

## 2020-04-16 ENCOUNTER — Ambulatory Visit (INDEPENDENT_AMBULATORY_CARE_PROVIDER_SITE_OTHER): Payer: 59 | Admitting: Obstetrics & Gynecology

## 2020-04-16 ENCOUNTER — Encounter: Payer: Self-pay | Admitting: Obstetrics & Gynecology

## 2020-04-16 ENCOUNTER — Other Ambulatory Visit: Payer: Self-pay

## 2020-04-16 ENCOUNTER — Other Ambulatory Visit (HOSPITAL_COMMUNITY)
Admission: RE | Admit: 2020-04-16 | Discharge: 2020-04-16 | Disposition: A | Discharge status: Home / Self Care | Payer: 59 | Source: Ambulatory Visit | Attending: Obstetrics & Gynecology | Admitting: Obstetrics & Gynecology

## 2020-04-16 VITALS — BP 120/80 | Ht 67.0 in | Wt 168.0 lb

## 2020-04-16 DIAGNOSIS — Z01419 Encounter for gynecological examination (general) (routine) without abnormal findings: Secondary | ICD-10-CM

## 2020-04-16 DIAGNOSIS — D069 Carcinoma in situ of cervix, unspecified: Secondary | ICD-10-CM

## 2020-04-16 DIAGNOSIS — Z23 Encounter for immunization: Secondary | ICD-10-CM

## 2020-04-16 MED ORDER — DOXYCYCLINE HYCLATE 100 MG PO CAPS: 100.0000 mg | ORAL_CAPSULE | Freq: Two times a day (BID) | ORAL | 0 refills | Status: DC

## 2020-04-16 NOTE — Patient Instructions (Signed)
Thank you for choosing [facility]. As part of our ongoing efforts to improve patient experience, we would appreciate your feedback. Please fill out the short survey that you will receive by mail or MyChart. Your opinion is important to us! -Dr Benigno Check  

## 2020-04-16 NOTE — Progress Notes (Signed)
HPI:      [name] is a 24 y.o. G1P1001 who LMP was Patient's last menstrual period was 04/02/2020., she presents today for her annual examination. The patient has no complaints today other than NEW ONSET post coital bleeding- light and only notes when wipes, for the last 1-2 mos.  The patient is sexually active. Her last pap: was normal.  Prior to that ASCUS.  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  The patient does perform self breast exams.  There is no notable family history of breast or ovarian cancer in her family.  The patient has regular exercise: yes.  The patient denies current symptoms of depression.    GYN History: Contraception: IUD  PMHx: History reviewed. No pertinent past medical history. Past Surgical History:  Procedure Laterality Date  . DENTAL SURGERY     Family History  Problem Relation Age of Onset  . Graves' disease Mother   . Hypertension Father   . Glaucoma Father   . Asthma Brother   . Diabetes Maternal Grandmother    Social History   Tobacco Use  . Smoking status: Never Smoker  . Smokeless tobacco: Never Used  Vaping Use  . Vaping Use: Never used  Substance Use Topics  . Alcohol use: Not Currently  . Drug use: Never    Current Outpatient Medications:  .  paragard intrauterine copper IUD IUD, by Intrauterine route., Disp: , Rfl:  .  DENTA 5000 PLUS 1.1 % CREA dental cream, BRUSH ONE DAILY IN THE MORNING (Patient not taking: Reported on 04/16/2020), Disp: , Rfl:  .  lidocaine (LIDODERM) 5 %, Place 1 patch onto the skin daily. Remove & Discard patch within 12 hours or as directed by MD, Disp: 30 patch, Rfl: 0 .  Multiple Vitamins-Minerals (WOMENS MULTI GUMMIES PO), , Disp: , Rfl:  .  sodium fluoride (FLUORISHIELD) 1.1 % GEL dental gel, SF 5000 Plus 1.1 % dental cream  BRUSH TEETH ONCE DAILY IN THE MORNING (Patient not taking: Reported on 04/16/2020), Disp: , Rfl:  Allergies: Nexium [esomeprazole magnesium]  Review of Systems  Constitutional:  Negative for chills, fever and malaise/fatigue.  HENT: Negative for congestion, sinus pain and sore throat.   Eyes: Negative for blurred vision and pain.  Respiratory: Negative for cough and wheezing.   Cardiovascular: Negative for chest pain and leg swelling.  Gastrointestinal: Negative for abdominal pain, constipation, diarrhea, heartburn, nausea and vomiting.  Genitourinary: Negative for dysuria, frequency, hematuria and urgency.  Musculoskeletal: Negative for back pain, joint pain, myalgias and neck pain.  Skin: Negative for itching and rash.  Neurological: Negative for dizziness, tremors and weakness.  Endo/Heme/Allergies: Does not bruise/bleed easily.  Psychiatric/Behavioral: Negative for depression. The patient is not nervous/anxious and does not have insomnia.     Objective: BP 120/80   Ht 5\' 7"  (1.702 m)   Wt 168 lb (76.2 kg)   LMP 04/02/2020   BMI 26.31 kg/m   Filed Weights   04/16/20 1027  Weight: 168 lb (76.2 kg)   Body mass index is 26.31 kg/m. Physical Exam Constitutional:      General: She is not in acute distress.    Appearance: She is well-developed.  Genitourinary:     Pelvic exam was performed with patient supine.     Urethra, bladder, vagina, uterus and rectum normal.     No lesions in the vagina.     No vaginal bleeding.     No cervical motion tenderness, friability, lesion or polyp.  Uterus is mobile.     Uterus is not enlarged.     No uterine mass detected.    Uterus is midaxial.     No right or left adnexal mass present.     Right adnexa not tender.     Left adnexa not tender.     Genitourinary Comments: Contact bleeding of cervix during PAP today No lesions  HENT:     Head: Normocephalic and atraumatic. No laceration.     Right Ear: Hearing normal.     Left Ear: Hearing normal.     Mouth/Throat:     Pharynx: Uvula midline.  Eyes:     Pupils: Pupils are equal, round, and reactive to light.  Neck:     Thyroid: No thyromegaly.    Cardiovascular:     Rate and Rhythm: Normal rate and regular rhythm.     Heart sounds: No murmur heard.  No friction rub. No gallop.   Pulmonary:     Effort: Pulmonary effort is normal. No respiratory distress.     Breath sounds: Normal breath sounds. No wheezing.  Chest:     Breasts:        Right: No mass, skin change or tenderness.        Left: No mass, skin change or tenderness.  Abdominal:     General: Bowel sounds are normal. There is no distension.     Palpations: Abdomen is soft.     Tenderness: There is no abdominal tenderness. There is no rebound.  Musculoskeletal:        General: Normal range of motion.     Cervical back: Normal range of motion and neck supple.  Neurological:     Mental Status: She is alert and oriented to person, place, and time.     Cranial Nerves: No cranial nerve deficit.  Skin:    General: Skin is warm and dry.  Psychiatric:        Judgment: Judgment normal.  Vitals reviewed.     Assessment:  ANNUAL EXAM 1. Need for immunization against influenza   2. CIN III (cervical intraepithelial neoplasia grade III) with severe dysplasia   3. Women's annual routine gynecological examination      Screening Plan:            1.  Cervical Screening-  Pap smear done today Repeat 6 mos.  2.  Labs managed by PCP  3. Counseling for contraception: IUD  Year one Paraguard   4. Need for immunization against influenza - Flu Vaccine QUAD 36+ mos IM   5. CIN III (cervical intraepithelial neoplasia grade III) with severe dysplasia  If next 2 PAPs normal then return to yearly interval   6. Postcoital bleeding - Round of Doxycycline - No visible signs of source today - IUD appears in right position     F/U  Return in about 6 months (around 10/14/2020) for Follow up.  Annamarie Major, MD, [facility], [facility] 04/16/2020  10:42 AM

## 2020-04-21 LAB — CYTOLOGY - PAP
Chlamydia: NEGATIVE
Comment: NEGATIVE
Comment: NEGATIVE
Comment: NORMAL
Diagnosis: NEGATIVE
Neisseria Gonorrhea: NEGATIVE
Trichomonas: NEGATIVE

## 2020-04-22 ENCOUNTER — Telehealth: Payer: Self-pay | Admitting: Obstetrics & Gynecology

## 2020-04-22 NOTE — Telephone Encounter (Signed)
Called and left voicemail for patient to call back to be scheduled. 

## 2020-04-22 NOTE — Telephone Encounter (Signed)
-----   Message from Nadara Mustard, MD sent at 04/22/2020  7:44 AM EDT ----- Sch follow up appt 6 months

## 2020-07-03 DIAGNOSIS — R69 Illness, unspecified: Secondary | ICD-10-CM | POA: Diagnosis not present

## 2020-07-30 ENCOUNTER — Other Ambulatory Visit: Payer: Self-pay

## 2020-07-30 ENCOUNTER — Ambulatory Visit (INDEPENDENT_AMBULATORY_CARE_PROVIDER_SITE_OTHER): Payer: 59 | Admitting: Obstetrics & Gynecology

## 2020-07-30 ENCOUNTER — Encounter: Payer: Self-pay | Admitting: Obstetrics & Gynecology

## 2020-07-30 VITALS — BP 122/74 | Ht 67.0 in | Wt 181.0 lb

## 2020-07-30 DIAGNOSIS — Z30432 Encounter for removal of intrauterine contraceptive device: Secondary | ICD-10-CM

## 2020-07-30 NOTE — Progress Notes (Signed)
  History of Present Illness:  Jennifer Nelson is a 25 y.o. that had a Paragard IUD placed approximately 1 years ago. Since that time, she states that now she is planning for pregnancy.  The following portions of the patient's history were reviewed and updated as appropriate: allergies, current medications, past family history, past medical history, past social history, past surgical history and problem list.  Patient Active Problem List   Diagnosis Date Noted  . CIN III (cervical intraepithelial neoplasia grade III) with severe dysplasia 02/22/2019  . Atypical glandular cells of undetermined significance (AGUS) on cervical Pap smear 11/07/2018  . Carpal tunnel syndrome during pregnancy 07/28/2018   Medications:  Current Outpatient Medications on File Prior to Visit  Medication Sig Dispense Refill  . doxycycline (VIBRAMYCIN) 100 MG capsule Take 1 capsule (100 mg total) by mouth 2 (two) times daily. 14 capsule 0  . paragard intrauterine copper IUD IUD by Intrauterine route.     No current facility-administered medications on file prior to visit.   Allergies: is allergic to nexium [esomeprazole magnesium].  Physical Exam:  BP 122/74   Ht 5\' 7"  (1.702 m)   Wt 181 lb (82.1 kg)   BMI 28.35 kg/m  Body mass index is 28.35 kg/m. Constitutional: Well nourished, well developed female in no acute distress.  Abdomen: diffusely non tender to palpation, non distended, and no masses, hernias Neuro: Grossly intact Psych:  Normal mood and affect.    Pelvic exam:  Two IUD strings present seen coming from the cervical os. EGBUS, vaginal vault and cervix: within normal limits  IUD Removal Strings of IUD identified and grasped.  IUD removed without problem.  Pt tolerated this well.  IUD noted to be intact.  Assessment: IUD Removal  Plan: IUD removed and plan for contraception is no method. Preconceptual counseling discussed    PNV now She was amenable to this plan. PAP planned in  March    (last 2 PAP normal, prior LEEP)    Risks of LEEP and cervical incompetence discussed  April, M.D. 07/30/2020 8:46 AM

## 2020-09-25 ENCOUNTER — Ambulatory Visit
Admission: EM | Admit: 2020-09-25 | Discharge: 2020-09-25 | Disposition: A | Discharge status: Home / Self Care | Payer: 59

## 2020-09-25 DIAGNOSIS — H6690 Otitis media, unspecified, unspecified ear: Secondary | ICD-10-CM

## 2020-09-25 DIAGNOSIS — J0110 Acute frontal sinusitis, unspecified: Secondary | ICD-10-CM

## 2020-09-25 MED ORDER — AMOXICILLIN-POT CLAVULANATE 875-125 MG PO TABS: 1.0000 | ORAL_TABLET | Freq: Two times a day (BID) | ORAL | 0 refills | Status: DC

## 2020-09-25 NOTE — ED Provider Notes (Signed)
Renaldo Fiddler    CSN: [id number] Arrival date & time: 09/25/20  0855      History   Chief Complaint Chief Complaint  Patient presents with  . Otalgia  . Cough    HPI Jennifer Nelson is a 25 y.o. female.   Pt is a 25 year old female that presents with sore throat, HA, congestion, bilateral ear pain, productive cough with green sputum since last Friday. Denies fever, SOB, n/v/d. Has been taking Mucinex severe, alka seltzer plus.  Last dose tylenol last night at 1930. Had two negative home covid tests over the weekend, had negative covid testing from [facility] on Tuesday.     History reviewed. No pertinent past medical history.  Patient Active Problem List   Diagnosis Date Noted  . CIN III (cervical intraepithelial neoplasia grade III) with severe dysplasia 02/22/2019  . Atypical glandular cells of undetermined significance (AGUS) on cervical Pap smear 11/07/2018  . Carpal tunnel syndrome during pregnancy 07/28/2018    Past Surgical History:  Procedure Laterality Date  . DENTAL SURGERY      OB History    Gravida  1   Para  1   Term  1   Preterm  0   AB  0   Living  1     SAB  0   IAB  0   Ectopic  0   Multiple      Live Births  1            Home Medications    Prior to Admission medications   Medication Sig Start Date End Date Taking? Authorizing Provider  amoxicillin-clavulanate (AUGMENTIN) 875-125 MG tablet Take 1 tablet by mouth every 12 (twelve) hours. 09/25/20  Yes Janace Aris, NP  paragard intrauterine copper IUD IUD See admin instructions.    [provider]    Family History Family History  Problem Relation Age of Onset  . Graves' disease Mother   . Hypertension Father   . Glaucoma Father   . Asthma Brother   . Diabetes Maternal Grandmother     Social History Social History   Tobacco Use  . Smoking status: Never Smoker  . Smokeless tobacco: Never Used  Vaping Use  . Vaping Use: Never used  Substance  Use Topics  . Alcohol use: Not Currently  . Drug use: Never     Allergies   Nexium [esomeprazole magnesium]   Review of Systems Review of Systems   Physical Exam Triage Vital Signs ED Triage Vitals [09/25/20 0923]  Enc Vitals Group     BP 109/70     Pulse Rate 84     Resp 18     Temp 98.3 F (36.8 C)     Temp Source Oral     SpO2 96 %     Weight      Height      Head Circumference      Peak Flow      Pain Score      Pain Loc      Pain Edu?      Excl. in GC?    No data found.  Updated Vital Signs BP 109/70 (BP Location: Left Arm)   Pulse 84   Temp 98.3 F (36.8 C) (Oral)   Resp 18   LMP 09/14/2020   SpO2 96%   Visual Acuity Right Eye Distance:   Left Eye Distance:   Bilateral Distance:    Right Eye Near:  Left Eye Near:    Bilateral Near:     Physical Exam Vitals and nursing note reviewed.  Constitutional:      General: She is not in acute distress.    Appearance: Normal appearance. She is not ill-appearing, toxic-appearing or diaphoretic.  HENT:     Head: Normocephalic.     Right Ear: Tympanic membrane and ear canal normal.     Left Ear: Tympanic membrane is erythematous and retracted.     Nose: Congestion present.     Mouth/Throat:     Pharynx: Oropharynx is clear.  Eyes:     Conjunctiva/sclera: Conjunctivae normal.  Cardiovascular:     Rate and Rhythm: Normal rate and regular rhythm.  Pulmonary:     Effort: Pulmonary effort is normal.     Breath sounds: Normal breath sounds.  Musculoskeletal:        General: Normal range of motion.     Cervical back: Normal range of motion.  Skin:    General: Skin is warm and dry.     Findings: No rash.  Neurological:     Mental Status: She is alert.  Psychiatric:        Mood and Affect: Mood normal.      UC Treatments / Results  Labs (all labs ordered are listed, but only abnormal results are displayed) Labs Reviewed - No data to display  EKG   Radiology No results  found.  Procedures Procedures (including critical care time)  Medications Ordered in UC Medications - No data to display  Initial Impression / Assessment and Plan / UC Course  I have reviewed the triage vital signs and the nursing notes.  Pertinent labs & imaging results that were available during my care of the patient were reviewed by me and considered in my medical decision making (see chart for details).     Otitis media and sinusitis Treating with amoxicillin. Recommended continue over-the-counter medicines for symptoms as needed. Follow up as needed for continued or worsening symptoms  Final Clinical Impressions(s) / UC Diagnoses   Final diagnoses:  Acute otitis media, unspecified otitis media type  Acute non-recurrent frontal sinusitis     Discharge Instructions     Treating you for an ear infection and sinus infection.  Take the antibiotics as prescribed.  You can use over-the-counter medicines, sinus, cold and cough medications as needed.  Tylenol and ibuprofen as needed.  Rest, hydrate Follow up as needed for continued or worsening symptoms     ED Prescriptions    Medication Sig Dispense Auth. Provider   amoxicillin-clavulanate (AUGMENTIN) 875-125 MG tablet Take 1 tablet by mouth every 12 (twelve) hours. 14 tablet Jaquille Kau A, NP     PDMP not reviewed this encounter.   Dahlia Byes A, NP 09/25/20 0945

## 2020-09-25 NOTE — Discharge Instructions (Addendum)
Treating you for an ear infection and sinus infection.  Take the antibiotics as prescribed.  You can use over-the-counter medicines, sinus, cold and cough medications as needed.  Tylenol and ibuprofen as needed.  Rest, hydrate Follow up as needed for continued or worsening symptoms

## 2020-09-25 NOTE — ED Triage Notes (Signed)
Pt c/o sore throat, HA, congestion, bilateral ear pain, productive cough with green sputum since last Friday.  Denies fever, SOB, n/v/d.  Has been taking Mucinex severe, alka seltzer plus.  Last dose tylenol last night at 1930. Had two negative home covid tests over the weekend, had negative covid testing [facility] on Tuesday.

## 2020-10-14 ENCOUNTER — Other Ambulatory Visit: Payer: Self-pay

## 2020-10-14 ENCOUNTER — Other Ambulatory Visit (HOSPITAL_COMMUNITY)
Admission: RE | Admit: 2020-10-14 | Discharge: 2020-10-14 | Disposition: A | Discharge status: Home / Self Care | Payer: 59 | Source: Ambulatory Visit | Attending: Obstetrics & Gynecology | Admitting: Obstetrics & Gynecology

## 2020-10-14 ENCOUNTER — Encounter: Payer: Self-pay | Admitting: Obstetrics & Gynecology

## 2020-10-14 ENCOUNTER — Ambulatory Visit (INDEPENDENT_AMBULATORY_CARE_PROVIDER_SITE_OTHER): Payer: 59 | Admitting: Obstetrics & Gynecology

## 2020-10-14 VITALS — BP 100/60 | Ht 66.0 in | Wt 186.0 lb

## 2020-10-14 DIAGNOSIS — Z124 Encounter for screening for malignant neoplasm of cervix: Secondary | ICD-10-CM

## 2020-10-14 DIAGNOSIS — D069 Carcinoma in situ of cervix, unspecified: Secondary | ICD-10-CM | POA: Diagnosis not present

## 2020-10-14 NOTE — Progress Notes (Signed)
  HPI:  Patient is a 25 y.o. G1P1001 presenting for follow up evaluation of abnormal PAP smear in the past.  Her last PAP was 6 months ago and was normal. She has had a prior colposcopy. Prior biopsies were CIN III (01/2019).  PAP 2020 was AGUS prior to those biopsies and LEEP.  Periods regular.  IUD out two months ago.  Trying for pregnancy.  PMHx: She  has no past medical history on file. Also,  has a past surgical history that includes Dental surgery., family history includes Asthma in her brother; Diabetes in her maternal grandmother; Glaucoma in her father; Luiz Blare' disease in her mother; Hypertension in her father.,  reports that she has never smoked. She has never used smokeless tobacco. She reports previous alcohol use. She reports that she does not use drugs.  She has a current medication list which includes the following prescription(s): amoxicillin-clavulanate and paragard intrauterine copper. Also, is allergic to nexium [esomeprazole magnesium].  Review of Systems  All other systems reviewed and are negative.   Objective: BP 100/60   Ht 5\' 6"  (1.676 m)   Wt 186 lb (84.4 kg)   LMP 10/11/2020   BMI 30.02 kg/m  Filed Weights   10/14/20 1357  Weight: 186 lb (84.4 kg)   Body mass index is 30.02 kg/m.  Physical examination Physical Exam Constitutional:      General: She is not in acute distress.    Appearance: She is well-developed.  Genitourinary:     No vaginal erythema or bleeding.      Right Adnexa: not tender and no mass present.    Left Adnexa: not tender and no mass present.    No cervical motion tenderness, discharge, polyp or nabothian cyst.     Uterus is not enlarged.     No uterine mass detected. HENT:     Head: Normocephalic and atraumatic.     Nose: Nose normal.  Abdominal:     General: There is no distension.     Palpations: Abdomen is soft.     Tenderness: There is no abdominal tenderness.  Musculoskeletal:        General: Normal range of motion.   Neurological:     Mental Status: She is alert and oriented to person, place, and time.     Cranial Nerves: No cranial nerve deficit.  Skin:    General: Skin is warm and dry.  Psychiatric:        Attention and Perception: Attention normal.        Mood and Affect: Mood and affect normal.        Speech: Speech normal.        Behavior: Behavior normal.        Thought Content: Thought content normal.        Judgment: Judgment normal.     ASSESSMENT:  History of Cervical Dysplasia, prior CIN III and LEEP  Plan:  1.  I discussed the grading system of pap smears and HPV high risk viral types.   2. Follow up PAP 6 months, vs intervention if high grade dysplasia identified. 3. PNV and monitoring for pregnancy  A total of 20 minutes were spent face-to-face with the patient as well as preparation, review, communication, and documentation during this encounter.    10/16/20, MD, Annamarie Major Ob/Gyn, [facility] 10/14/2020  2:12 PM

## 2020-10-16 LAB — CYTOLOGY - PAP: Diagnosis: NEGATIVE

## 2021-01-08 ENCOUNTER — Other Ambulatory Visit: Payer: Self-pay

## 2021-01-08 ENCOUNTER — Ambulatory Visit (INDEPENDENT_AMBULATORY_CARE_PROVIDER_SITE_OTHER): Payer: 59 | Admitting: Obstetrics & Gynecology

## 2021-01-08 ENCOUNTER — Other Ambulatory Visit (HOSPITAL_COMMUNITY)
Admission: RE | Admit: 2021-01-08 | Discharge: 2021-01-08 | Disposition: A | Discharge status: Home / Self Care | Payer: 59 | Source: Ambulatory Visit | Attending: Obstetrics & Gynecology | Admitting: Obstetrics & Gynecology

## 2021-01-08 ENCOUNTER — Encounter: Payer: Self-pay | Admitting: Obstetrics & Gynecology

## 2021-01-08 VITALS — BP 100/60 | Wt 177.0 lb

## 2021-01-08 DIAGNOSIS — N926 Irregular menstruation, unspecified: Secondary | ICD-10-CM

## 2021-01-08 DIAGNOSIS — Z3A08 8 weeks gestation of pregnancy: Secondary | ICD-10-CM | POA: Diagnosis not present

## 2021-01-08 DIAGNOSIS — R69 Illness, unspecified: Secondary | ICD-10-CM | POA: Diagnosis not present

## 2021-01-08 DIAGNOSIS — Z113 Encounter for screening for infections with a predominantly sexual mode of transmission: Secondary | ICD-10-CM | POA: Insufficient documentation

## 2021-01-08 DIAGNOSIS — Z9889 Other specified postprocedural states: Secondary | ICD-10-CM | POA: Diagnosis not present

## 2021-01-08 LAB — POCT URINE PREGNANCY: Preg Test, Ur: POSITIVE — AB

## 2021-01-08 NOTE — Progress Notes (Signed)
01/08/2021   Chief Complaint: Missed period  History of Present Illness: Ms. Sundby is a 25 y.o. G2P1001 [redacted]w[redacted]d based on Patient's last menstrual period was 11/10/2020 (exact date). with an Estimated Date of Delivery: 08/17/21, with the above CC.   Her periods were: regular periods every 28 days She was using no method when she conceived.  She has Positive signs or symptoms of nausea/vomiting of pregnancy. She has Negative signs or symptoms of miscarriage or preterm labor She identifies Negative Zika risk factors for her and her partner On any different medications around the time she conceived/early pregnancy: No  History of varicella: Yes   ROS: A 12-point review of systems was performed and negative, except as stated in the above HPI.  OBGYN History: As per HPI. OB History  Gravida Para Term Preterm AB Living  2 1 1  0 0 1  SAB IAB Ectopic Multiple Live Births  0 0 0   1    # Outcome Date GA Lbr Len/2nd Weight Sex Delivery Anes PTL Lv  2 Current           1 Term 09/10/18 [redacted]w[redacted]d  8 lb 8 oz (3.856 kg) M Vag-Spont EPI  LIV    Any issues with any prior pregnancies: yes, NSVD Any prior children are healthy, doing well, without any problems or issues: yes History of pap smears: Yes. Last pap smear 09/2020 nml (prior LEEP). History of STIs: No   Past Medical History: History reviewed. No pertinent past medical history.  Past Surgical History: Past Surgical History:  Procedure Laterality Date   DENTAL SURGERY      Family History:  Family History  Problem Relation Age of Onset   Graves' disease Mother    Hypertension Father    Glaucoma Father    Asthma Brother    Diabetes Maternal Grandmother    She denies any female cancers, bleeding or blood clotting disorders.  She denies any history of mental retardation, birth defects or genetic disorders in her or the FOB's history  Social History:  Social History   Socioeconomic History   Marital status: Married    Spouse  name: 10/2020   Number of children: Not on file   Years of education: Not on file   Highest education level: Not on file  Occupational History   Not on file  Tobacco Use   Smoking status: Never   Smokeless tobacco: Never  Vaping Use   Vaping Use: Never used  Substance and Sexual Activity   Alcohol use: Not Currently   Drug use: Never   Sexual activity: Yes    Birth control/protection: None  Other Topics Concern   Not on file  Social History Narrative   ** Merged History Encounter **       Social Determinants of Health   Financial Resource Strain: Not on file  Food Insecurity: Not on file  Transportation Needs: Not on file  Physical Activity: Not on file  Stress: Not on file  Social Connections: Not on file  Intimate Partner Violence: Not on file   Any pets in the household: no  Allergy: Allergies  Allergen Reactions   Nexium [Esomeprazole Magnesium] Nausea And Vomiting    Current Outpatient Medications: No current outpatient medications on file.   Physical Exam:   BP 100/60   Wt 177 lb (80.3 kg)   LMP 11/10/2020 (Exact Date)   BMI 28.57 kg/m  Body mass index is 28.57 kg/m. Constitutional: Well nourished, well developed female in no  acute distress.  Neck:  Supple, normal appearance, and no thyromegaly  Cardiovascular: S1, S2 normal, no murmur, rub or gallop, regular rate and rhythm Respiratory:  Clear to auscultation bilateral. Normal respiratory effort Abdomen: positive bowel sounds and no masses, hernias; diffusely non tender to palpation, non distended Breasts: breasts appear normal, no suspicious masses, no skin or nipple changes or axillary nodes. Neuro/Psych:  Normal mood and affect.  Skin:  Warm and dry.  Lymphatic:  No inguinal lymphadenopathy.   Pelvic exam: is not limited by body habitus EGBUS: within normal limits, Vagina: within normal limits and with no blood in the vault, Cervix: normal appearing cervix without discharge or lesions,  closed/long/high, no sign of scarring or thinning due to prior LEEP; Uterus:  enlarged: 8 weeks, and Adnexa:  normal adnexa  Assessment: Ms. Signer is a 25 y.o. G2P1001 [redacted]w[redacted]d based on Patient's last menstrual period was 11/10/2020 (exact date). with an Estimated Date of Delivery: 08/17/21,  for prenatal care.  Plan:  1) Avoid alcoholic beverages. 2) Patient encouraged not to smoke.  3) Discontinue the use of all non-medicinal drugs and chemicals.  4) Take prenatal vitamins daily.  5) Seatbelt use advised 6) Nutrition, food safety (fish, cheese advisories, and high nitrite foods) and exercise discussed. 7) Hospital and practice style delivering at [facility] discussed  8) Patient is asked about travel to areas at risk for the Zika virus, and counseled to avoid travel and exposure to mosquitoes or sexual partners who may have themselves been exposed to the virus. Testing is discussed, and will be ordered as appropriate.  9) Childbirth classes at [facility] advised 10) Genetic Screening, such as with 1st Trimester Screening, cell free fetal DNA, AFP testing, and Ultrasound, as well as with amniocentesis and [facility] as appropriate, is discussed with patient. She plans to have genetic testing this pregnancy. Labs nv [place] nv Cervical checks due to LEEP discussed  Problem list reviewed and updated.  Annamarie Major, MD, Merl[facility] Frederick Ob/G[facility], [facility] 01/08/2021  9:18 AM

## 2021-01-08 NOTE — Patient Instructions (Signed)
Thank you for choosing [facility]. As part of our ongoing efforts to improve patient experience, we would appreciate your feedback. Please fill out the short survey that you will receive by mail or MyChart. Your opinion is important to us! -Dr Chung Chagoya  First Trimester of Pregnancy  The first trimester of pregnancy starts on the first day of your last menstrual period until the end of week 12. This is months 1 through 3 of pregnancy. A week after a sperm fertilizes an egg, the egg will implant into the wall of the uterus and begin to develop into a baby. By the end of 12 weeks, all the baby'sorgans will be formed and the baby will be 2-3 inches in size. Body changes during your first trimester Your body goes through many changes during pregnancy. The changes vary andgenerally return to normal after your baby is born. Physical changes You may gain or lose weight. Your breasts may begin to grow larger and become tender. The tissue that surrounds your nipples (areola) may become darker. Dark spots or blotches (chloasma or mask of pregnancy) may develop on your face. You may have changes in your hair. These can include thickening or thinning of your hair or changes in texture. Health changes You may feel nauseous, and you may vomit. You may have heartburn. You may develop headaches. You may develop constipation. Your gums may bleed and may be sensitive to brushing and flossing. Other changes You may tire easily. You may urinate more often. Your menstrual periods will stop. You may have a loss of appetite. You may develop cravings for certain kinds of food. You may have changes in your emotions from day to day. You may have more vivid and strange dreams. Follow these instructions at home: Medicines Follow your health care provider's instructions regarding medicine use. Specific medicines may be either safe or unsafe to take during pregnancy. Do not take any medicines unless told to by your  health care provider. Take a prenatal vitamin that contains at least 600 micrograms (mcg) of folic acid. Eating and drinking Eat a healthy diet that includes fresh fruits and vegetables, whole grains, good sources of protein such as meat, eggs, or tofu, and low-fat dairy products. Avoid raw meat and unpasteurized juice, milk, and cheese. These carry germs that can harm you and your baby. If you feel nauseous or you vomit: Eat 4 or 5 small meals a day instead of 3 large meals. Try eating a few soda crackers. Drink liquids between meals instead of during meals. You may need to take these actions to prevent or treat constipation: Drink enough fluid to keep your urine pale yellow. Eat foods that are high in fiber, such as beans, whole grains, and fresh fruits and vegetables. Limit foods that are high in fat and processed sugars, such as fried or sweet foods. Activity Exercise only as directed by your health care provider. Most people can continue their usual exercise routine during pregnancy. Try to exercise for 30 minutes at least 5 days a week. Stop exercising if you develop pain or cramping in the lower abdomen or lower back. Avoid exercising if it is very hot or humid or if you are at high altitude. Avoid heavy lifting. If you choose to, you may have sex unless your health care provider tells you not to. Relieving pain and discomfort Wear a good support bra to relieve breast tenderness. Rest with your legs elevated if you have leg cramps or low back pain. If you   develop bulging veins (varicose veins) in your legs: Wear support hose as told by your health care provider. Elevate your feet for 15 minutes, 3-4 times a day. Limit salt in your diet. Safety Wear your seat belt at all times when driving or riding in a car. Talk with your health care provider if someone is verbally or physically abusive to you. Talk with your health care provider if you are feeling sad or have thoughts of hurting  yourself. Lifestyle Do not use hot tubs, steam rooms, or saunas. Do not douche. Do not use tampons or scented sanitary pads. Do not use herbal remedies, alcohol, illegal drugs, or medicines that are not approved by your health care provider. Chemicals in these products can harm your baby. Do not use any products that contain nicotine or tobacco, such as cigarettes, e-cigarettes, and chewing tobacco. If you need help quitting, ask your health care provider. Avoid cat litter boxes and soil used by cats. These carry germs that can cause birth defects in the baby and possibly loss of the unborn baby (fetus) by miscarriage or stillbirth. General instructions During routine prenatal visits in the first trimester, your health care provider will do a physical exam, perform necessary tests, and ask you how things are going. Keep all follow-up visits. This is important. Ask for help if you have counseling or nutritional needs during pregnancy. Your health care provider can offer advice or refer you to specialists for help with various needs. Schedule a dentist appointment. At home, brush your teeth with a soft toothbrush. Floss gently. Write down your questions. Take them to your prenatal visits. Where to find more information American Pregnancy Association: americanpregnancy.org American College of Obstetricians and Gynecologists: acog.org/en/Womens%20Health/Pregnancy Office on Women's Health: womenshealth.gov/pregnancy Contact a health care provider if you have: Dizziness. A fever. Mild pelvic cramps, pelvic pressure, or nagging pain in the abdominal area. Nausea, vomiting, or diarrhea that lasts for 24 hours or longer. A bad-smelling vaginal discharge. Pain when you urinate. Known exposure to a contagious illness, such as chickenpox, measles, Zika virus, HIV, or hepatitis. Get help right away if you have: Spotting or bleeding from your vagina. Severe abdominal cramping or pain. Shortness of  breath or chest pain. Any kind of trauma, such as from a fall or a car crash. New or increased pain, swelling, or redness in an arm or leg. Summary The first trimester of pregnancy starts on the first day of your last menstrual period until the end of week 12 (months 1 through 3). Eating 4 or 5 small meals a day rather than 3 large meals may help to relieve nausea and vomiting. Do not use any products that contain nicotine or tobacco, such as cigarettes, e-cigarettes, and chewing tobacco. If you need help quitting, ask your health care provider. Keep all follow-up visits. This is important. This information is not intended to replace advice given to you by your health care provider. Make sure you discuss any questions you have with your healthcare provider. Document Revised: 12/18/2019 Document Reviewed: 10/24/2019 Elsevier Patient Education  2022 [employer]  

## 2021-01-11 LAB — URINE CULTURE

## 2021-01-12 LAB — GC/CHLAMYDIA PROBE AMP (~~LOC~~) NOT AT ARMC
Chlamydia: NEGATIVE
Comment: NEGATIVE
Comment: NORMAL
Neisseria Gonorrhea: NEGATIVE

## 2021-01-20 ENCOUNTER — Ambulatory Visit
Admission: RE | Admit: 2021-01-20 | Discharge: 2021-01-20 | Disposition: A | Discharge status: Home / Self Care | Payer: 59 | Source: Ambulatory Visit

## 2021-01-20 ENCOUNTER — Other Ambulatory Visit: Payer: Self-pay

## 2021-01-20 VITALS — BP 102/68 | HR 83 | Temp 97.9°F | Resp 16

## 2021-01-20 DIAGNOSIS — J069 Acute upper respiratory infection, unspecified: Secondary | ICD-10-CM

## 2021-01-20 DIAGNOSIS — Z3A10 10 weeks gestation of pregnancy: Secondary | ICD-10-CM

## 2021-01-20 DIAGNOSIS — Z1152 Encounter for screening for COVID-19: Secondary | ICD-10-CM | POA: Diagnosis not present

## 2021-01-20 HISTORY — DX: Encounter for supervision of normal pregnancy, unspecified, unspecified trimester: Z34.90

## 2021-01-20 NOTE — ED Provider Notes (Signed)
Renaldo Fiddler    CSN: [id number] Arrival date & time: 01/20/21  2229      History   Chief Complaint Chief Complaint  Patient presents with   Headache   Nasal Congestion   Otalgia     HPI Jennifer Nelson is a 25 y.o. female.  Patient is currently [redacted] weeks pregnant.  She presents with 4-day history of headache, nasal congestion, earache, sore throat.  She denies fever, chills, cough, shortness of breath, abdominal pain, vomiting, diarrhea, or other symptoms.  No treatments attempted at home.  Patient was seen here on 09/25/2020; diagnosed with acute otitis media and acute sinusitis; treated with Augmentin.  The history is provided by the patient and medical records.   Past Medical History:  Diagnosis Date   Pregnancy     Patient Active Problem List   Diagnosis Date Noted   H/O LEEP 01/08/2021   CIN III (cervical intraepithelial neoplasia grade III) with severe dysplasia 02/22/2019   Atypical glandular cells of undetermined significance (AGUS) on cervical Pap smear 11/07/2018   Carpal tunnel syndrome during pregnancy 07/28/2018    Past Surgical History:  Procedure Laterality Date   DENTAL SURGERY      OB History     Gravida  2   Para  1   Term  1   Preterm  0   AB  0   Living  1      SAB  0   IAB  0   Ectopic  0   Multiple      Live Births  1            Home Medications    Prior to Admission medications   Medication Sig Start Date End Date Taking? Authorizing Provider  Prenatal Vit-Fe Fumarate-FA (PRENATAL MULTIVITAMIN) TABS tablet Take 1 tablet by mouth daily at 12 noon.   Yes [provider]    Family History Family History  Problem Relation Age of Onset   Graves' disease Mother    Hypertension Father    Glaucoma Father    Asthma Brother    Diabetes Maternal Grandmother     Social History Social History   Tobacco Use   Smoking status: Never   Smokeless tobacco: Never  Vaping Use   Vaping Use:  Never used  Substance Use Topics   Alcohol use: Not Currently   Drug use: Never     Allergies   Nexium [esomeprazole magnesium]   Review of Systems Review of Systems  Constitutional:  Negative for chills and fever.  HENT:  Positive for congestion, ear pain and sore throat.   Respiratory:  Negative for cough and shortness of breath.   Cardiovascular:  Negative for chest pain and palpitations.  Gastrointestinal:  Negative for abdominal pain and vomiting.  Skin:  Negative for color change and rash.  Neurological:  Positive for headaches. Negative for dizziness, syncope, weakness and numbness.  All other systems reviewed and are negative.   Physical Exam Triage Vital Signs ED Triage Vitals  Enc Vitals Group     BP      Pulse      Resp      Temp      Temp src      SpO2      Weight      Height      Head Circumference      Peak Flow      Pain Score      Pain Loc  Pain Edu?      Excl. in GC?    No data found.  Updated Vital Signs BP 102/68 (BP Location: Right Arm)   Pulse 83   Temp 97.9 F (36.6 C) (Oral)   Resp 16   LMP 11/10/2020 (Exact Date)   SpO2 97%   Visual Acuity Right Eye Distance:   Left Eye Distance:   Bilateral Distance:    Right Eye Near:   Left Eye Near:    Bilateral Near:     Physical Exam Vitals and nursing note reviewed.  Constitutional:      General: She is not in acute distress.    Appearance: She is well-developed. She is not ill-appearing.  HENT:     Head: Normocephalic and atraumatic.     Right Ear: Tympanic membrane and ear canal normal.     Left Ear: Tympanic membrane and ear canal normal.     Nose: Nose normal.     Mouth/Throat:     Mouth: Mucous membranes are moist.     Pharynx: Oropharynx is clear.  Eyes:     Conjunctiva/sclera: Conjunctivae normal.  Cardiovascular:     Rate and Rhythm: Normal rate and regular rhythm.     Heart sounds: Normal heart sounds.  Pulmonary:     Effort: Pulmonary effort is normal. No  respiratory distress.     Breath sounds: Normal breath sounds.  Abdominal:     Palpations: Abdomen is soft.     Tenderness: There is no abdominal tenderness.  Musculoskeletal:     Cervical back: Neck supple.  Skin:    General: Skin is warm and dry.  Neurological:     General: No focal deficit present.     Mental Status: She is alert and oriented to person, place, and time.     Gait: Gait normal.  Psychiatric:        Mood and Affect: Mood normal.        Behavior: Behavior normal.     UC Treatments / Results  Labs (all labs ordered are listed, but only abnormal results are displayed) Labs Reviewed  NOVEL CORONAVIRUS, NAA    EKG   Radiology No results found.  Procedures Procedures (including critical care time)  Medications Ordered in UC Medications - No data to display  Initial Impression / Assessment and Plan / UC Course  I have reviewed the triage vital signs and the nursing notes.  Pertinent labs & imaging results that were available during my care of the patient were reviewed by me and considered in my medical decision making (see chart for details).  Viral URI, COVID test, [redacted] weeks pregnant.  COVID pending.  Instructed patient to self quarantine per CDC guidelines.  Discussed symptomatic treatment including Tylenol, rest, hydration.  Instructed her to take over-the-counter treatments only as directed by her OB/GYN.  She has an appointment scheduled tomorrow.  Instructed patient to follow up with OB/GYN as scheduled tomorrow.  Patient agrees to plan of care.    Final Clinical Impressions(s) / UC Diagnoses   Final diagnoses:  Encounter for screening for COVID-19  [redacted] weeks gestation of pregnancy  Viral URI     Discharge Instructions      Take medications only as directed by your OB/GYN.  Follow-up as scheduled tomorrow.    Your COVID test is pending.  You should self quarantine until the test result is back.         ED Prescriptions   None     PDMP  not reviewed this encounter.   Mickie Bail, NP 01/20/21 1037

## 2021-01-20 NOTE — ED Triage Notes (Signed)
Pt presents today with c/o of nasal congestion, right ear pain and headache x 4 days. Denies fever. She is [redacted] weeks pregnant.

## 2021-01-20 NOTE — Discharge Instructions (Addendum)
Take medications only as directed by your OB/GYN.  Follow-up as scheduled tomorrow.    Your COVID test is pending.  You should self quarantine until the test result is back.

## 2021-01-21 ENCOUNTER — Other Ambulatory Visit: Payer: Self-pay | Admitting: Obstetrics & Gynecology

## 2021-01-21 ENCOUNTER — Encounter: Payer: 59 | Admitting: Obstetrics & Gynecology

## 2021-01-21 ENCOUNTER — Telehealth: Payer: Self-pay

## 2021-01-21 LAB — NOVEL CORONAVIRUS, NAA: SARS-CoV-2, NAA: NOT DETECTED

## 2021-01-21 LAB — SARS-COV-2, NAA 2 DAY TAT

## 2021-01-21 MED ORDER — ONDANSETRON 4 MG PO TBDP: 4.0000 mg | ORAL_TABLET | Freq: Four times a day (QID) | ORAL | 0 refills | Status: DC | PRN

## 2021-01-21 MED ORDER — BUTALBITAL-APAP-CAFFEINE 50-325-40 MG PO CAPS: 1.0000 | ORAL_CAPSULE | Freq: Four times a day (QID) | ORAL | 0 refills | Status: DC | PRN

## 2021-01-21 NOTE — Telephone Encounter (Signed)
D/w pt.  Rx called in. Thx.

## 2021-01-21 NOTE — Telephone Encounter (Signed)
Pt calling d/t PH's sched being cancelled; preg; has migraine; nothing otc helps.   [phone]  Pt was hoping to talk to [facility] about migraines today.  Pt states has hardly been able to lift her head off the couch d/t the migraine; has tried e.s.tylenol, caffeine, ice, dark room; nothing helps; out of work for 5d b/c of migraine.  Pharm correct in chart.

## 2021-01-28 ENCOUNTER — Encounter: Payer: Self-pay | Admitting: Obstetrics and Gynecology

## 2021-01-28 ENCOUNTER — Ambulatory Visit (INDEPENDENT_AMBULATORY_CARE_PROVIDER_SITE_OTHER): Payer: 59 | Admitting: Obstetrics and Gynecology

## 2021-01-28 ENCOUNTER — Other Ambulatory Visit: Payer: Self-pay

## 2021-01-28 VITALS — BP 118/77 | Ht 67.0 in | Wt 177.6 lb

## 2021-01-28 DIAGNOSIS — Z3480 Encounter for supervision of other normal pregnancy, unspecified trimester: Secondary | ICD-10-CM | POA: Insufficient documentation

## 2021-01-28 DIAGNOSIS — Z3481 Encounter for supervision of other normal pregnancy, first trimester: Secondary | ICD-10-CM

## 2021-01-28 DIAGNOSIS — O3440 Maternal care for other abnormalities of cervix, unspecified trimester: Secondary | ICD-10-CM | POA: Insufficient documentation

## 2021-01-28 DIAGNOSIS — Z3A11 11 weeks gestation of pregnancy: Secondary | ICD-10-CM

## 2021-01-28 DIAGNOSIS — Z1379 Encounter for other screening for genetic and chromosomal anomalies: Secondary | ICD-10-CM | POA: Diagnosis not present

## 2021-01-28 DIAGNOSIS — Z9889 Other specified postprocedural states: Secondary | ICD-10-CM

## 2021-01-28 DIAGNOSIS — O3441 Maternal care for other abnormalities of cervix, first trimester: Secondary | ICD-10-CM

## 2021-01-28 NOTE — Progress Notes (Signed)
Routine Prenatal Care Visit  Subjective  Jennifer Nelson is a 25 y.o. G2P1001 at [redacted]w[redacted]d being seen today for ongoing prenatal care.  She is currently monitored for the following issues for this low-risk pregnancy and has Carpal tunnel syndrome during pregnancy; Atypical glandular cells of undetermined significance (AGUS) on cervical Pap smear; CIN III (cervical intraepithelial neoplasia grade III) with severe dysplasia; H/O LEEP; Supervision of other normal pregnancy, antepartum; and History of LEEP (loop electrosurgical excision procedure) of cervix complicating pregnancy on their problem list.  ----------------------------------------------------------------------------------- Patient reports no complaints.    . Vag. Bleeding: None.   . Leaking Fluid denies.  EDD confirmed by u/s today ----------------------------------------------------------------------------------- The following portions of the patient's history were reviewed and updated as appropriate: allergies, current medications, past family history, past medical history, past social history, past surgical history and problem list. Problem list updated.  Objective  Blood pressure 118/77, height 5\' 7"  (1.702 m), weight 177 lb 9.6 oz (80.6 kg), last menstrual period 11/10/2020. Pregravid weight 174 lb (78.9 kg) Total Weight Gain 3 lb 9.6 oz (1.633 kg) Urinalysis: Urine Protein    Urine Glucose    Fetal Status: Fetal Heart Rate (bpm): 163         General:  Alert, oriented and cooperative. Patient is in no acute distress.  Skin: Skin is warm and dry. No rash noted.   Cardiovascular: Normal heart rate noted  Respiratory: Normal respiratory effort, no problems with respiration noted  Abdomen: Soft, gravid, appropriate for gestational age. Pain/Pressure: Absent     Pelvic:  Cervical exam deferred        Extremities: Normal range of motion.     Mental Status: Normal mood and affect. Normal behavior. Normal judgment and thought  content.   Assessment   25 y.o. G2P1001 at [redacted]w[redacted]d by  08/17/2021, by Last Menstrual Period presenting for routine prenatal visit  Plan   pregnancy 2  Problems (from 01/08/21 to present)     Problem Noted Resolved   Supervision of other normal pregnancy, antepartum 01/28/2021 by 03/31/2021, MD No   Overview Signed 01/28/2021  9:24 AM by 03/31/2021, MD     Nursing Staff Provider  Office Location  [facility] Dating    Language  English Anatomy Conard Novak    Flu Vaccine   Genetic Screen  NIPS:   TDaP vaccine    Hgb A1C or  GTT Early : Third trimester :   Covid    LAB RESULTS   Rhogam   Blood Type     Feeding Plan  Antibody    Contraception  Rubella    Circumcision  RPR     Pediatrician   HBsAg     Support Person  HIV    Prenatal Classes  Varicella     GBS  (For PCN allergy, check sensitivities)   BTL Consent     VBAC Consent  Pap      Hgb Electro    Pelvis Tested  CF      SMA                 History of LEEP (loop electrosurgical excision procedure) of cervix complicating pregnancy 01/28/2021 by 03/31/2021, MD No   Overview Signed 01/28/2021  9:24 AM by 03/31/2021, MD    Cervical length checks starting at 6 weeks            Preterm labor symptoms and general obstetric precautions including but not limited to vaginal  bleeding, contractions, leaking of fluid and fetal movement were reviewed in detail with the patient. Please refer to After Visit Summary for other counseling recommendations.   schedule cvx length u/s  Return in about 4 weeks (around 02/25/2021) for Routine Prenatal Appointment.   Thomasene Mohair, MD, [facility], [facility] 01/28/2021 10:00 AM

## 2021-01-28 NOTE — Progress Notes (Signed)
ULTRASOUND REPORT  Location: [facility] Date of Service: 01/28/2021   Indications:dating (transabdominal) Findings:  Singleton intrauterine pregnancy is visualized with a CRL consistent with [redacted]w[redacted]d gestation, giving an (U/S) EDD of 08/15/2021. The (U/S) EDD is consistent with the clinically established EDD of 08/17/2021.  FHR: 163 BPM CRL measurement: 48.0 mm Yolk sac is visualized and appears normal. Amnion: visualized and appears normal   Right Ovary is normal in appearance. Left Ovary is normal appearance. Corpus luteal cyst:  is not visualized Survey of the adnexa demonstrates no adnexal masses. There is no free peritoneal fluid in the cul de sac.  Impression: 1. [redacted]w[redacted]d Viable Singleton Intrauterine pregnancy by U/S. 2. (U/S) EDD is consistent with Clinically established EDD of 08/17/2021.  There is a viable singleton gestation.  Detailed evaluation of the fetal anatomy is precluded by early gestational age.  It must be noted that a normal ultrasound particular at this early gestational age is unable to rule out fetal aneuploidy, risk of first trimester miscarriage, or anatomic birth defects.  I personally performed the ultrasound and interpreted the images.  Thomasene Mohair, MD, [facility], [facility] 01/28/2021 1:06 PM

## 2021-02-01 LAB — RPR+RH+ABO+RUB AB+AB SCR+CB...
Antibody Screen: NEGATIVE
HIV Screen 4th Generation wRfx: NONREACTIVE
Hematocrit: 39.4 % (ref 34.0–46.6)
Hemoglobin: 13.7 g/dL (ref 11.1–15.9)
Hepatitis B Surface Ag: NEGATIVE
MCH: 31.7 pg (ref 26.6–33.0)
MCHC: 34.8 g/dL (ref 31.5–35.7)
MCV: 91 fL (ref 79–97)
Platelets: 280 10*3/uL (ref 150–450)
RBC: 4.32 x10E6/uL (ref 3.77–5.28)
RDW: 11.8 % (ref 11.7–15.4)
RPR Ser Ql: NONREACTIVE
Rh Factor: POSITIVE
Rubella Antibodies, IGG: 3.33 index (ref >0.99)
Varicella zoster IgG: 537 index (ref >165)
WBC: 7.4 10*3/uL (ref 3.4–10.8)

## 2021-02-01 LAB — MATERNIT 21 PLUS CORE, BLOOD
Fetal Fraction: 7
Result (T21): NEGATIVE
Trisomy 13 (Patau syndrome): NEGATIVE
Trisomy 18 (Edwards syndrome): NEGATIVE
Trisomy 21 (Down syndrome): NEGATIVE

## 2021-03-03 ENCOUNTER — Ambulatory Visit: Payer: 59 | Attending: Obstetrics and Gynecology

## 2021-03-03 ENCOUNTER — Telehealth: Payer: Self-pay

## 2021-03-03 ENCOUNTER — Ambulatory Visit: Payer: 59 | Admitting: *Deleted

## 2021-03-03 ENCOUNTER — Ambulatory Visit (HOSPITAL_BASED_OUTPATIENT_CLINIC_OR_DEPARTMENT_OTHER): Payer: 59 | Admitting: Obstetrics and Gynecology

## 2021-03-03 ENCOUNTER — Encounter: Payer: Self-pay | Admitting: *Deleted

## 2021-03-03 ENCOUNTER — Other Ambulatory Visit: Payer: Self-pay

## 2021-03-03 ENCOUNTER — Ambulatory Visit
Admission: RE | Admit: 2021-03-03 | Discharge: 2021-03-03 | Disposition: A | Discharge status: Home / Self Care | Payer: 59 | Source: Ambulatory Visit | Attending: Emergency Medicine | Admitting: Emergency Medicine

## 2021-03-03 ENCOUNTER — Other Ambulatory Visit: Payer: Self-pay | Admitting: *Deleted

## 2021-03-03 VITALS — BP 104/58 | HR 92

## 2021-03-03 VITALS — BP 109/70 | HR 99 | Temp 97.8°F | Resp 17

## 2021-03-03 DIAGNOSIS — Z3480 Encounter for supervision of other normal pregnancy, unspecified trimester: Secondary | ICD-10-CM | POA: Insufficient documentation

## 2021-03-03 DIAGNOSIS — R509 Fever, unspecified: Secondary | ICD-10-CM | POA: Diagnosis not present

## 2021-03-03 DIAGNOSIS — Z3481 Encounter for supervision of other normal pregnancy, first trimester: Secondary | ICD-10-CM | POA: Diagnosis not present

## 2021-03-03 DIAGNOSIS — O3442 Maternal care for other abnormalities of cervix, second trimester: Secondary | ICD-10-CM

## 2021-03-03 DIAGNOSIS — Z3A16 16 weeks gestation of pregnancy: Secondary | ICD-10-CM

## 2021-03-03 DIAGNOSIS — Z1152 Encounter for screening for COVID-19: Secondary | ICD-10-CM | POA: Diagnosis not present

## 2021-03-03 DIAGNOSIS — Z9889 Other specified postprocedural states: Secondary | ICD-10-CM | POA: Diagnosis not present

## 2021-03-03 DIAGNOSIS — O3441 Maternal care for other abnormalities of cervix, first trimester: Secondary | ICD-10-CM | POA: Diagnosis not present

## 2021-03-03 DIAGNOSIS — B349 Viral infection, unspecified: Secondary | ICD-10-CM | POA: Diagnosis not present

## 2021-03-03 DIAGNOSIS — Z362 Encounter for other antenatal screening follow-up: Secondary | ICD-10-CM

## 2021-03-03 LAB — POCT INFLUENZA A/B
Influenza A, POC: NEGATIVE
Influenza B, POC: NEGATIVE

## 2021-03-03 NOTE — Progress Notes (Signed)
Maternal-Fetal Medicine   Name: Trina Asch DOB: 10-Aug-1995 MRN: [id number] Referring Provider:   I had the pleasure of seeing [name] at [facility]. She is G2 P1 at 39-weeks' gestation and is here for cervical length measurement.  Past surgical history significant for LEEP performed in 2020 following the finding of intraepithelial neoplasia (CIN III).  Obstetric history significant for a term vaginal delivery in February 2020 of a female infant weighing 8 pounds and 8 ounces at birth.  Patient had LEEP procedure after her childbirth.  Past medical history: No history of diabetes or hypertension. Blood pressure today at her office is 104/58 mmHg.  Pulse 92/minute.  Ultrasound A limited ultrasound study was performed.  Amniotic fluid is normal and good fetal activity seen.  We performed transvaginal ultrasound to evaluate the cervix.  The cervix measures 3 cm, which is within normal limits.  No shortening or funneling was seen on transfundal pressure.  History of LEEP Overall risk for preterm delivery following LEEP is only slightly increased. The interval since LEEP is not associated with increased risk. Mid-trimester pregnancy loss can be associated with LEEP, but less common than preterm deliveries.  In one study (Ultrasound Obstet Gynecol, 2010 Nov; 36(5) 287-86), midtrimester cervical length in women with previous LEEP or cone biopsy were found to be significantly shorter than in those without a history of cervical procedures. However, there was no significant difference in the groups for cervical length below 2.5 cm.  No clear recommendations exists for management of short cervix (and in the absence of preterm delivery) seen on mid-trimester scan.   Given a very low incidence of miscarriages, cerclage or vaginal progesterone may be indicated only if cervical length is below 2 cm.   We perform cervical length measurement anatomy  scan.  Recommendations -An appointment was made for her to return in 3 weeks for fetal anatomy scan and cervical length measurement. -If cervical length measurement is within normal limits, further surveillance is not necessary.  Thank you for consultation.  If you have any questions or concerns, please contact me the [facility].  Consultation including face-to-face (more than 50%) counseling 30 minutes.

## 2021-03-03 NOTE — Telephone Encounter (Signed)
Pt called after hours nurse line yesterday complaining about a fever, nausea, headache.  Was advised by nurse to head to ER. Pt did not go to ER last night but will go to urgent care this morning, has a 10:00 appt. States fever and nausea is gone, just headache still there.

## 2021-03-03 NOTE — ED Triage Notes (Signed)
Patient c/o nausea and headache x 1 day.   Patient denies emesis.   Patient endorses a temperature of 101 F at it's highest at home.   Patient endorses lightheadedness, chills, and generalized aching pains.   Patient endorses photosensitivity.   Patient has taken prescribed "headache medication" and Tylenol with no relief of symptoms.   Patient has taken 1 at home COVID test with a negative result.   Patient is [redacted] weeks pregnant.

## 2021-03-03 NOTE — ED Provider Notes (Signed)
[facility]    CSN: [id number] Arrival date & time: 03/03/21  9509      History   Chief Complaint Chief Complaint  Patient presents with   Headache   Nausea    HPI Loanne Shahad Mazurek is a 25 y.o. female [redacted] weeks pregnant presenting today for evaluation of headache and fever.  Patient reports that yesterday woke up with a headache, feeling nauseous.  Noted to develop increased fatigue as well as fevers yesterday evening.  Reports mild congestion.  Denies any sore throat or cough.  Has had some neck discomfort, but denies any difficulty moving neck. She denies any abdominal pain, vomiting or diarrhea.  Denies urinary symptoms.  HPI  Past Medical History:  Diagnosis Date   Pregnancy     Patient Active Problem List   Diagnosis Date Noted   Supervision of other normal pregnancy, antepartum 01/28/2021   History of LEEP (loop electrosurgical excision procedure) of cervix complicating pregnancy 01/28/2021   H/O LEEP 01/08/2021   CIN III (cervical intraepithelial neoplasia grade III) with severe dysplasia 02/22/2019   Atypical glandular cells of undetermined significance (AGUS) on cervical Pap smear 11/07/2018   Carpal tunnel syndrome during pregnancy 07/28/2018    Past Surgical History:  Procedure Laterality Date   DENTAL SURGERY      OB History     Gravida  2   Para  1   Term  1   Preterm  0   AB  0   Living  1      SAB  0   IAB  0   Ectopic  0   Multiple      Live Births  1            Home Medications    Prior to Admission medications   Medication Sig Start Date End Date Taking? Authorizing Provider  Butalbital-APAP-Caffeine 50-325-40 MG capsule Take 1-2 capsules by mouth every 6 (six) hours as needed for headache. 01/21/21  Yes Nadara Mustard, MD  ondansetron (ZOFRAN ODT) 4 MG disintegrating tablet Take 1 tablet (4 mg total) by mouth every 6 (six) hours as needed for nausea. 01/21/21   Nadara Mustard, MD  Prenatal Vit-Fe  Fumarate-FA (PRENATAL MULTIVITAMIN) TABS tablet Take 1 tablet by mouth daily at 12 noon.    [provider]    Family History Family History  Problem Relation Age of Onset   Graves' disease Mother    Hypertension Father    Glaucoma Father    Asthma Brother    Diabetes Maternal Grandmother     Social History Social History   Tobacco Use   Smoking status: Never   Smokeless tobacco: Never  Vaping Use   Vaping Use: Never used  Substance Use Topics   Alcohol use: Not Currently   Drug use: Never     Allergies   Nexium [esomeprazole magnesium]   Review of Systems Review of Systems  Constitutional:  Positive for chills, fatigue and fever. Negative for activity change and appetite change.  HENT:  Positive for congestion and rhinorrhea. Negative for ear pain, sinus pressure, sore throat and trouble swallowing.   Eyes:  Negative for discharge and redness.  Respiratory:  Negative for cough, chest tightness and shortness of breath.   Cardiovascular:  Negative for chest pain.  Gastrointestinal:  Negative for abdominal pain, diarrhea, nausea and vomiting.  Musculoskeletal:  Negative for myalgias.  Skin:  Negative for rash.  Neurological:  Positive for dizziness and headaches. Negative  for light-headedness.    Physical Exam Triage Vital Signs ED Triage Vitals  Enc Vitals Group     BP 03/03/21 1032 109/70     Pulse Rate 03/03/21 1032 99     Resp 03/03/21 1032 17     Temp 03/03/21 1032 97.8 F (36.6 C)     Temp Source 03/03/21 1032 Oral     SpO2 03/03/21 1032 98 %     Weight --      Height --      Head Circumference --      Peak Flow --      Pain Score 03/03/21 1006 8     Pain Loc --      Pain Edu? --      Excl. in GC? --    No data found.  Updated Vital Signs BP 109/70 (BP Location: Right Arm)   Pulse 99   Temp 97.8 F (36.6 C) (Oral)   Resp 17   LMP 11/10/2020 (Exact Date)   SpO2 98%   Visual Acuity Right Eye Distance:   Left Eye Distance:    Bilateral Distance:    Right Eye Near:   Left Eye Near:    Bilateral Near:     Physical Exam Vitals and nursing note reviewed.  Constitutional:      Appearance: She is well-developed.     Comments: No acute distress  HENT:     Head: Normocephalic and atraumatic.     Ears:     Comments: Bilateral ears without tenderness to palpation of external auricle, tragus and mastoid, EAC's without erythema or swelling, TM's with good bony landmarks and cone of light. Non erythematous.      Nose: Nose normal.     Mouth/Throat:     Comments: Oral mucosa pink and moist, no tonsillar enlargement or exudate. Posterior pharynx patent and nonerythematous, no uvula deviation or swelling. Normal phonation.  Eyes:     Conjunctiva/sclera: Conjunctivae normal.  Cardiovascular:     Rate and Rhythm: Normal rate.  Pulmonary:     Effort: Pulmonary effort is normal. No respiratory distress.     Comments: Breathing comfortably at rest, CTABL, no wheezing, rales or other adventitious sounds auscultated  Abdominal:     General: There is no distension.  Musculoskeletal:        General: Normal range of motion.     Cervical back: Neck supple.  Skin:    General: Skin is warm and dry.  Neurological:     Mental Status: She is alert and oriented to person, place, and time.     UC Treatments / Results  Labs (all labs ordered are listed, but only abnormal results are displayed) Labs Reviewed  NOVEL CORONAVIRUS, NAA  POCT INFLUENZA A/B    EKG   Radiology No results found.  Procedures Procedures (including critical care time)  Medications Ordered in UC Medications - No data to display  Initial Impression / Assessment and Plan / UC Course  I have reviewed the triage vital signs and the nursing notes.  Pertinent labs & imaging results that were available during my care of the patient were reviewed by me and considered in my medical decision making (see chart for details).     2 days of  fever, headache and fatigue-minimal URI symptoms, flu test negative, COVID test pending, recommending continued symptomatic and supportive care of symptoms and treat as viral etiology.  Exam reassuring today.  Rest and fluids.  Discussed strict return precautions. Patient verbalized  understanding and is agreeable with plan.  Final Clinical Impressions(s) / UC Diagnoses   Final diagnoses:  Fever, unspecified  Viral illness     Discharge Instructions      Flu test negative, COVID pending Rest and fluids Tylenol every 4 hours for fever, headache Please monitor symptoms over the next 3 to 4 days, follow-up if not improving or worsening     ED Prescriptions   None    PDMP not reviewed this encounter.   Lew Dawes, PA-C 03/03/21 1056

## 2021-03-03 NOTE — Discharge Instructions (Addendum)
Flu test negative, COVID pending Rest and fluids Tylenol every 4 hours for fever, headache Please monitor symptoms over the next 3 to 4 days, follow-up if not improving or worsening

## 2021-03-04 ENCOUNTER — Encounter: Payer: Self-pay | Admitting: Obstetrics & Gynecology

## 2021-03-04 ENCOUNTER — Ambulatory Visit (INDEPENDENT_AMBULATORY_CARE_PROVIDER_SITE_OTHER): Payer: 59 | Admitting: Obstetrics & Gynecology

## 2021-03-04 VITALS — BP 100/60 | Wt 184.0 lb

## 2021-03-04 DIAGNOSIS — Z3482 Encounter for supervision of other normal pregnancy, second trimester: Secondary | ICD-10-CM

## 2021-03-04 DIAGNOSIS — Z3480 Encounter for supervision of other normal pregnancy, unspecified trimester: Secondary | ICD-10-CM

## 2021-03-04 DIAGNOSIS — Z3A16 16 weeks gestation of pregnancy: Secondary | ICD-10-CM

## 2021-03-04 NOTE — Progress Notes (Signed)
  Subjective  No pain and rare nausea Headaches still frequent  Objective  BP 100/60   Wt 184 lb (83.5 kg)   LMP 11/10/2020 (Exact Date)   BMI 28.82 kg/m  General: NAD Pumonary: no increased work of breathing Abdomen: gravid, non-tender Extremities: no edema Psychiatric: mood appropriate, affect full  Assessment  25 y.o. G2P1001 at [redacted]w[redacted]d by  08/17/2021, by Last Menstrual Period presenting for routine prenatal visit  Plan   Problem List Items Addressed This Visit     Encounter for supervision of other normal pregnancy in second trimester    -  Primary   [redacted] weeks gestation of pregnancy        PNV [place] reviewed (cerv length normal) Anat [place] 3 weeks  pregnancy 2  Problems (from 01/08/21 to present)    Problem Noted Resolved   Supervision of other normal pregnancy, antepartum 01/28/2021 by Conard Novak, MD No   Overview Signed 01/28/2021  9:24 AM by Conard Novak, MD     Nursing Staff Provider  Office Location  [facility] Dating  LMP, [place] confirmed  Language  English Anatomy US  MFM planned  Flu Vaccine   Genetic Screen  NIPS: nml XX  TDaP vaccine    Hgb A1C or  GTT Third trimester : p       Annamarie Major, MD, [facility], [facility] 03/04/2021  11:42 AM\

## 2021-03-04 NOTE — Patient Instructions (Signed)
Recommendations to boost your immunity to prevent illness such as viral flu and colds, including covid19, are as follows: °      - - -  Vitamin K2 and Vitamin D3  - - - °Take Vitamin K2 at 200-300 mcg daily (usually 2-3 pills daily of the over the counter formulation). °Take Vitamin D3 at 3000-4000 U daily (usually 3-4 pills daily of the over the counter formulation). °Studies show that these two at high normal levels in your system are very effective in keeping your immunity so strong and protective that you will be unlikely to contract viral illness such as those listed above. ° °Dr Jaythen Hamme ° °Second Trimester of Pregnancy °The second trimester of pregnancy is from week 13 through week 27. This is months 4 through 6 of pregnancy. The second trimester is often a time when you feel your best. Your body has adjusted to being pregnant, and you begin to feel better physically. °During the second trimester: °Morning sickness has lessened or stopped completely. °You may have more energy. °You may have an increase in appetite. °The second trimester is also a time when the unborn baby (fetus) is growing rapidly. At the end of the sixth month, the fetus may be up to 12 inches long and weigh about 1½ pounds. You will likely begin to feel the baby move (quickening) between 16 and 20 weeks of pregnancy. °Body changes during your second trimester °Your body continues to go through many changes during your second trimester. The changes vary and generally return to normal after the baby is born. °Physical changes °Your weight will continue to increase. You will notice your lower abdomen bulging out. °You may begin to get stretch marks on your hips, abdomen, and breasts. °Your breasts will continue to grow and to become tender. °Dark spots or blotches (chloasma or mask of pregnancy) may develop on your face. °A dark line from your belly button to the pubic area (linea nigra) may appear. °You may have changes in your hair. These can  include thickening of your hair, rapid growth, and changes in texture. Some people also have hair loss during or after pregnancy, or hair that feels dry or thin. °Health changes °You may develop headaches. °You may have heartburn. °You may develop constipation. °You may develop hemorrhoids or swollen, bulging veins (varicose veins). °Your gums may bleed and may be sensitive to brushing and flossing. °You may urinate more often because the fetus is pressing on your bladder. °You may have back pain. This is caused by: °Weight gain. °Pregnancy hormones that are relaxing the joints in your pelvis. °A shift in weight and the muscles that support your balance. °Follow these instructions at home: °Medicines °Follow your health care provider's instructions regarding medicine use. Specific medicines may be either safe or unsafe to take during pregnancy. Do not take any medicines unless approved by your health care provider. °Take a prenatal vitamin that contains at least 600 micrograms (mcg) of folic acid. °Eating and drinking °Eat a healthy diet that includes fresh fruits and vegetables, whole grains, good sources of protein such as meat, eggs, or tofu, and low-fat dairy products. °Avoid raw meat and unpasteurized juice, milk, and cheese. These carry germs that can harm you and your baby. °You may need to take these actions to prevent or treat constipation: °Drink enough fluid to keep your urine pale yellow. °Eat foods that are high in fiber, such as beans, whole grains, and fresh fruits and vegetables. °Limit foods that are   high in fat and processed sugars, such as fried or sweet foods. °Activity °Exercise only as directed by your health care provider. Most people can continue their usual exercise routine during pregnancy. Try to exercise for 30 minutes at least 5 days a week. Stop exercising if you develop contractions in your uterus. °Stop exercising if you develop pain or cramping in the lower abdomen or lower  back. °Avoid exercising if it is very hot or humid or if you are at a high altitude. °Avoid heavy lifting. °If you choose to, you may have sex unless your health care provider tells you not to. °Relieving pain and discomfort °Wear a supportive bra to prevent discomfort from breast tenderness. °Take warm sitz baths to soothe any pain or discomfort caused by hemorrhoids. Use hemorrhoid cream if your health care provider approves. °Rest with your legs raised (elevated) if you have leg cramps or low back pain. °If you develop varicose veins: °Wear support hose as told by your health care provider. °Elevate your feet for 15 minutes, 3-4 times a day. °Limit salt in your diet. °Safety °Wear your seat belt at all times when driving or riding in a car. °Talk with your health care provider if someone is verbally or physically abusive to you. °Lifestyle °Do not use hot tubs, steam rooms, or saunas. °Do not douche. Do not use tampons or scented sanitary pads. °Avoid cat litter boxes and soil used by cats. These carry germs that can cause birth defects in the baby and possibly loss of the fetus by miscarriage or stillbirth. °Do not use herbal remedies, alcohol, illegal drugs, or medicines that are not approved by your health care provider. Chemicals in these products can harm your baby. °Do not use any products that contain nicotine or tobacco, such as cigarettes, e-cigarettes, and chewing tobacco. If you need help quitting, ask your health care provider. °General instructions °During a routine prenatal visit, your health care provider will do a physical exam and other tests. He or she will also discuss your overall health. Keep all follow-up visits. This is important. °Ask your health care provider for a referral to a local prenatal education class. °Ask for help if you have counseling or nutritional needs during pregnancy. Your health care provider can offer advice or refer you to specialists for help with various  needs. °Where to find more information °American Pregnancy Association: americanpregnancy.org °American College of Obstetricians and Gynecologists: acog.org/en/Womens%20Health/Pregnancy °Office on Women's Health: womenshealth.gov/pregnancy °Contact a health care provider if you have: °A headache that does not go away when you take medicine. °Vision changes or you see spots in front of your eyes. °Mild pelvic cramps, pelvic pressure, or nagging pain in the abdominal area. °Persistent nausea, vomiting, or diarrhea. °A bad-smelling vaginal discharge or foul-smelling urine. °Pain when you urinate. °Sudden or extreme swelling of your face, hands, ankles, feet, or legs. °A fever. °Get help right away if you: °Have fluid leaking from your vagina. °Have spotting or bleeding from your vagina. °Have severe abdominal cramping or pain. °Have difficulty breathing. °Have chest pain. °Have fainting spells. °Have not felt your baby move for the time period told by your health care provider. °Have new or increased pain, swelling, or redness in an arm or leg. °Summary °The second trimester of pregnancy is from week 13 through week 27 (months 4 through 6). °Do not use herbal remedies, alcohol, illegal drugs, or medicines that are not approved by your health care provider. Chemicals in these products can harm your   Exercise only as directed by your health care provider. Most people can continue their usual exercise routine during pregnancy. Keep all follow-up visits. This is important. This information is not intended to replace advice given to you by your health care provider. Make sure you discuss any questions you have with your healthcare provider. Document Revised: 12/18/2019 Document Reviewed: 10/24/2019 Elsevier Patient Education  2022 [employer].

## 2021-03-05 LAB — NOVEL CORONAVIRUS, NAA: SARS-CoV-2, NAA: NOT DETECTED

## 2021-03-05 LAB — SARS-COV-2, NAA 2 DAY TAT

## 2021-03-25 ENCOUNTER — Ambulatory Visit: Payer: 59 | Attending: Obstetrics and Gynecology

## 2021-03-25 ENCOUNTER — Encounter: Payer: Self-pay | Admitting: *Deleted

## 2021-03-25 ENCOUNTER — Ambulatory Visit: Payer: 59 | Admitting: *Deleted

## 2021-03-25 ENCOUNTER — Other Ambulatory Visit: Payer: Self-pay

## 2021-03-25 VITALS — BP 106/60 | HR 69

## 2021-03-25 DIAGNOSIS — O3442 Maternal care for other abnormalities of cervix, second trimester: Secondary | ICD-10-CM | POA: Insufficient documentation

## 2021-03-25 DIAGNOSIS — Z3689 Encounter for other specified antenatal screening: Secondary | ICD-10-CM | POA: Diagnosis not present

## 2021-03-25 DIAGNOSIS — Z3480 Encounter for supervision of other normal pregnancy, unspecified trimester: Secondary | ICD-10-CM

## 2021-03-25 DIAGNOSIS — Z362 Encounter for other antenatal screening follow-up: Secondary | ICD-10-CM | POA: Diagnosis present

## 2021-03-25 DIAGNOSIS — O9270 Unspecified disorders of lactation: Secondary | ICD-10-CM

## 2021-03-25 DIAGNOSIS — Z3A19 19 weeks gestation of pregnancy: Secondary | ICD-10-CM

## 2021-03-25 DIAGNOSIS — Z9889 Other specified postprocedural states: Secondary | ICD-10-CM | POA: Insufficient documentation

## 2021-03-25 DIAGNOSIS — O3440 Maternal care for other abnormalities of cervix, unspecified trimester: Secondary | ICD-10-CM | POA: Insufficient documentation

## 2021-03-26 ENCOUNTER — Other Ambulatory Visit: Payer: Self-pay | Admitting: *Deleted

## 2021-03-26 DIAGNOSIS — Z362 Encounter for other antenatal screening follow-up: Secondary | ICD-10-CM

## 2021-04-01 ENCOUNTER — Other Ambulatory Visit: Payer: Self-pay

## 2021-04-01 ENCOUNTER — Ambulatory Visit (INDEPENDENT_AMBULATORY_CARE_PROVIDER_SITE_OTHER): Payer: 59 | Admitting: Obstetrics

## 2021-04-01 VITALS — BP 108/70 | Wt 189.6 lb

## 2021-04-01 DIAGNOSIS — Z3482 Encounter for supervision of other normal pregnancy, second trimester: Secondary | ICD-10-CM

## 2021-04-01 DIAGNOSIS — Z3A20 20 weeks gestation of pregnancy: Secondary | ICD-10-CM

## 2021-04-01 NOTE — Progress Notes (Signed)
Routine Prenatal Care Visit  Subjective  Jennifer Nelson is a 25 y.o. G2P1001 at [redacted]w[redacted]d being seen today for ongoing prenatal care.  She is currently monitored for the following issues for this low-risk pregnancy and has Carpal tunnel syndrome during pregnancy; Atypical glandular cells of undetermined significance (AGUS) on cervical Pap smear; CIN III (cervical intraepithelial neoplasia grade III) with severe dysplasia; H/O LEEP; Supervision of other normal pregnancy, antepartum; and History of LEEP (loop electrosurgical excision procedure) of cervix complicating pregnancy on their problem list.  ----------------------------------------------------------------------------------- Patient reports no complaints.  She can feel ample fetal movement Contractions: Not present. Vag. Bleeding: None.  Movement: Present. Leaking Fluid denies.  ----------------------------------------------------------------------------------- The following portions of the patient's history were reviewed and updated as appropriate: allergies, current medications, past family history, past medical history, past social history, past surgical history and problem list. Problem list updated.  Objective  Blood pressure 108/70, weight 189 lb 9.6 oz (86 kg), last menstrual period 11/10/2020. Pregravid weight 174 lb (78.9 kg) Total Weight Gain 15 lb 9.6 oz (7.076 kg) Urinalysis: Urine Protein    Urine Glucose    Fetal Status:     Movement: Present     General:  Alert, oriented and cooperative. Patient is in no acute distress.  Skin: Skin is warm and dry. No rash noted.   Cardiovascular: Normal heart rate noted  Respiratory: Normal respiratory effort, no problems with respiration noted  Abdomen: Soft, gravid, appropriate for gestational age. Pain/Pressure: Absent     Pelvic:  Cervical exam deferred        Extremities: Normal range of motion.     Mental Status: Normal mood and affect. Normal behavior. Normal judgment and  thought content.   Assessment   25 y.o. G2P1001 at [redacted]w[redacted]d by  08/17/2021, by Last Menstrual Period presenting for routine prenatal visit  Plan   June 2022 Problems (from 01/08/21 to present)    Problem Noted Resolved   Supervision of other normal pregnancy, antepartum 01/28/2021 by Jennifer Novak, MD No   Overview Addendum 03/04/2021 11:46 AM by Jennifer Mustard, MD     Nursing Staff Provider  Office Location  [facility] Dating  LMP, [place] confirmed  Language  English Anatomy US    Flu Vaccine   Genetic Screen  NIPS: XX  TDaP vaccine    Hgb A1C or  GTT Third trimester :   Covid    LAB RESULTS   Rhogam  n/a Blood Type O/Positive/-- (07/07 1010)   Feeding Plan  Antibody Negative (07/07 1010)  Contraception  Rubella 3.33 (07/07 1010)  Circumcision  RPR Non Reactive (07/07 1010)   Pediatrician   HBsAg Negative (07/07 1010)   Support Person  HIV Non Reactive (07/07 1010)  Prenatal Classes  Varicella Immune    GBS  (For PCN allergy, check sensitivities)   BTL Consent     VBAC Consent n/a Pap  UTD           History of LEEP (loop electrosurgical excision procedure) of cervix complicating pregnancy 01/28/2021 by Jennifer Novak, MD No   Overview Signed 01/28/2021  9:24 AM by Jennifer Novak, MD    Cervical length checks starting at 6 weeks          Preterm labor symptoms and general obstetric precautions including but not limited to vaginal bleeding, contractions, leaking of fluid and fetal movement were reviewed in detail with the patient. Please refer to After Visit Summary for other counseling recommendations.  Discussed her  recent ultrasound- she will have a repeat scan for better views in a month. Still undecided on a name for her daughter. Thinking about Jennifer Nelson  Return in about 4 weeks (around 04/29/2021) for return OB with Jennifer Nelson.Jennifer Nelson, CNM  04/01/2021 11:57 AM

## 2021-04-22 ENCOUNTER — Encounter: Payer: Self-pay | Admitting: *Deleted

## 2021-04-22 ENCOUNTER — Ambulatory Visit: Payer: 59 | Attending: Maternal & Fetal Medicine

## 2021-04-22 ENCOUNTER — Other Ambulatory Visit: Payer: Self-pay

## 2021-04-22 ENCOUNTER — Ambulatory Visit: Payer: 59

## 2021-04-22 ENCOUNTER — Ambulatory Visit: Payer: 59 | Admitting: *Deleted

## 2021-04-22 VITALS — BP 110/60 | HR 87

## 2021-04-22 DIAGNOSIS — Z3480 Encounter for supervision of other normal pregnancy, unspecified trimester: Secondary | ICD-10-CM | POA: Diagnosis not present

## 2021-04-22 DIAGNOSIS — Z3A23 23 weeks gestation of pregnancy: Secondary | ICD-10-CM

## 2021-04-22 DIAGNOSIS — O3442 Maternal care for other abnormalities of cervix, second trimester: Secondary | ICD-10-CM

## 2021-04-22 DIAGNOSIS — Z362 Encounter for other antenatal screening follow-up: Secondary | ICD-10-CM | POA: Insufficient documentation

## 2021-04-28 ENCOUNTER — Other Ambulatory Visit: Payer: Self-pay

## 2021-04-28 ENCOUNTER — Encounter: Payer: Self-pay | Admitting: Obstetrics & Gynecology

## 2021-04-28 ENCOUNTER — Ambulatory Visit (INDEPENDENT_AMBULATORY_CARE_PROVIDER_SITE_OTHER): Payer: Managed Care, Other (non HMO) | Admitting: Obstetrics & Gynecology

## 2021-04-28 VITALS — BP 100/70 | Wt 189.0 lb

## 2021-04-28 DIAGNOSIS — Z3A24 24 weeks gestation of pregnancy: Secondary | ICD-10-CM

## 2021-04-28 DIAGNOSIS — Z3482 Encounter for supervision of other normal pregnancy, second trimester: Secondary | ICD-10-CM

## 2021-04-28 DIAGNOSIS — Z23 Encounter for immunization: Secondary | ICD-10-CM

## 2021-04-28 DIAGNOSIS — Z131 Encounter for screening for diabetes mellitus: Secondary | ICD-10-CM

## 2021-04-28 LAB — POCT URINALYSIS DIPSTICK OB
Glucose, UA: NEGATIVE
POC,PROTEIN,UA: NEGATIVE

## 2021-04-28 MED ORDER — ONDANSETRON 4 MG PO TBDP: 4.0000 mg | ORAL_TABLET | Freq: Four times a day (QID) | ORAL | 0 refills | Status: DC | PRN

## 2021-04-28 NOTE — Patient Instructions (Signed)
Thank you for choosing [facility]. As part of our ongoing efforts to improve patient experience, we would appreciate your feedback. Please fill out the short survey that you will receive by mail or MyChart. Your opinion is important to [place]! -Dr Tiburcio Pea  Back Pain in Pregnancy Back pain during pregnancy is common. Back pain may be caused by several factors that are related to changes during your pregnancy, including: Your growing baby and uterus. This may result in a changing center of gravity and cause your abdominal muscles to weaken. Pregnancy hormones, which may cause the ligaments of the pelvis to relax. Weight gain during pregnancy. Your posture or position. Follow these instructions at home: Managing pain and stiffness   If directed, put ice on the painful area. To do this: Put ice in a plastic bag. Place a towel between your skin and the bag. Leave the ice on for 20 minutes, 2-3 times per day. Remove the ice if your skin turns bright red. This is very important. If you cannot feel pain, heat, or cold, you have a greater risk of damage to the area. If directed, apply heat to the affected area before you exercise. Use the heat source that your health care provider recommends, such as a moist heat pack or a heating pad. Place a towel between your skin and the heat source. Leave the heat on for 20-30 minutes. Remove the heat if your skin turns bright red. This is especially important if you are unable to feel pain, heat, or cold. You may have a greater risk of getting burned. If directed, massage the affected area. Activity Exercise as told by your health care provider. Gentle exercise is the best way to prevent or manage back pain. Listen to your body when lifting. If lifting hurts, ask for help. Squat down when picking up something from the floor. Do not bend over. Squatting uses your leg muscles instead of your back muscles. Only use bed rest for short periods as told by your health  care provider. Bed rest should only be used for the most severe episodes of back pain. Standing, sitting, and lying down Do not stand in one place for long periods of time. Use good posture when sitting. Make sure your head rests over your shoulders and is not hanging forward. Use a pillow on your lower back if necessary. Try sleeping on your side with a pregnancy support pillow or one or two regular pillows between your legs. It may be best to sleep on your left side. If you have back pain after a night's rest, your bed may be too soft. A firm mattress may provide more support for your back during pregnancy. General instructions Take over-the-counter and prescription medicines only as told by your health care provider. Use a maternity girdle, elastic sling, or back brace as told by your health care provider. Work with a physical therapist or massage therapist to find ways to manage back pain. Acupuncture or massage therapy may be helpful. Eat a healthy diet. Try to gain weight within your health care provider's recommendations. Do not wear high heels. Keep all follow-up visits. This is important. Contact a health care provider if: Your back pain interferes with your daily activities. You have back pain on one side of your back. You have increasing pain in other parts of your body. You have numbness, tingling, weakness, or problems with the use of your arms or legs. You have numbness that travels down one or both of your  legs. You have nausea, vomiting, or sweating. Get help right away if: You develop any of the following: Shortness of breath, dizziness, or fainting. Severe back pain that is not controlled with medicine. Changes in bowel or bladder control, or you see blood in your urine. You have back pain that is a rhythmic, cramping pain similar to labor pains. Labor pains are usually 2 minutes apart, last for about 1 minute, and involve a bearing down feeling or pressure in your  pelvis. You have back pain and your water breaks or you have bleeding from your vagina. Your back pain developed after you fell. These symptoms may represent a serious problem that is an emergency. Do not wait to see if the symptoms will go away. Get medical help right away. Call your local emergency services (911 in the [place] Do not drive yourself to the hospital. Summary Back pain may be caused by several factors that are related to changes during your pregnancy. Follow instructions as told by your health care provider for managing back pain. Take over-the-counter and prescription medicines only as told by your health care provider. Exercise as told by your health care provider. Gentle exercise is the best way to prevent or manage back pain. Keep all follow-up visits. This is important. This information is not intended to replace advice given to you by your health care provider. Make sure you discuss any questions you have with your health care provider. Document Revised: 09/23/2020 Document Reviewed: 09/23/2020 Elsevier Patient Education  2022 [employer].

## 2021-04-28 NOTE — Progress Notes (Signed)
  Subjective  Fetal Movement? yes Contractions? no Leaking Fluid? no Vaginal Bleeding? no  Objective  BP 100/70   Wt 189 lb (85.7 kg)   LMP 11/10/2020 (Exact Date)   BMI 29.60 kg/m  General: NAD Pumonary: no increased work of breathing Abdomen: gravid, non-tender Extremities: no edema Psychiatric: mood appropriate, affect full  Assessment  25 y.o. G2P1001 at [redacted]w[redacted]d by  08/17/2021, by Last Menstrual Period presenting for routine prenatal visit  Plan   Problem List Items Addressed This Visit   None Visit Diagnoses    Encounter for supervision of other normal pregnancy in second trimester    -  Primary   Screening for diabetes mellitus       Relevant Orders   28 Week RH+Panel   [redacted] weeks gestation of pregnancy       Need for immunization against influenza       Relevant Orders   Flu Vaccine QUAD 49mo+IM (Fluarix, Fluzone & Alfiuria Quad PF) (Completed)   Need for influenza vaccination        PNV Glucola nv Flu shot today Covid vacc UTD  June 2022 Problems (from 01/08/21 to present)    Problem Noted Resolved   Supervision of other normal pregnancy, antepartum 01/28/2021 by Conard Novak, MD No   Overview Addendum 03/04/2021 11:46 AM by Nadara Mustard, MD     Nursing Staff Provider  Office Location  [facility] Dating  LMP, [place] confirmed  Language  English Anatomy US    Flu Vaccine   Genetic Screen  NIPS: XX  TDaP vaccine    Hgb A1C or  GTT Third trimester :   Covid    LAB RESULTS   Rhogam  n/a Blood Type O/Positive/-- (07/07 1010)   Feeding Plan  Antibody Negative (07/07 1010)  Contraception  Rubella 3.33 (07/07 1010)  Circumcision  RPR Non Reactive (07/07 1010)   Pediatrician   HBsAg Negative (07/07 1010)   Support Person  HIV Non Reactive (07/07 1010)  Prenatal Classes  Varicella Immune    GBS  (For PCN allergy, check sensitivities)   BTL Consent     VBAC Consent n/a Pap  UTD           History of LEEP (loop electrosurgical excision procedure) of cervix  complicating pregnancy 01/28/2021 by Conard Novak, MD No   Overview Signed 01/28/2021  9:24 AM by Conard Novak, MD    Cervical length checks starting at 6 weeks          Annamarie Major, MD, [facility], [facility] 04/28/2021  11:55 AM

## 2021-05-26 ENCOUNTER — Other Ambulatory Visit: Payer: Self-pay

## 2021-05-26 ENCOUNTER — Other Ambulatory Visit: Payer: Managed Care, Other (non HMO)

## 2021-05-26 ENCOUNTER — Encounter: Payer: Self-pay | Admitting: Obstetrics & Gynecology

## 2021-05-26 ENCOUNTER — Ambulatory Visit (INDEPENDENT_AMBULATORY_CARE_PROVIDER_SITE_OTHER): Payer: Managed Care, Other (non HMO) | Admitting: Obstetrics & Gynecology

## 2021-05-26 VITALS — BP 120/80 | Wt 202.0 lb

## 2021-05-26 DIAGNOSIS — Z3480 Encounter for supervision of other normal pregnancy, unspecified trimester: Secondary | ICD-10-CM

## 2021-05-26 DIAGNOSIS — Z3A28 28 weeks gestation of pregnancy: Secondary | ICD-10-CM

## 2021-05-26 DIAGNOSIS — Z131 Encounter for screening for diabetes mellitus: Secondary | ICD-10-CM

## 2021-05-26 DIAGNOSIS — Z3483 Encounter for supervision of other normal pregnancy, third trimester: Secondary | ICD-10-CM

## 2021-05-26 LAB — POCT URINALYSIS DIPSTICK OB
Glucose, UA: NEGATIVE
POC,PROTEIN,UA: NEGATIVE

## 2021-05-26 NOTE — Addendum Note (Signed)
Addended by: Cornelius Moras D on: 05/26/2021 09:42 AM   Modules accepted: Orders

## 2021-05-26 NOTE — Progress Notes (Signed)
  Subjective  Fetal Movement? yes Contractions? no Leaking Fluid? no Vaginal Bleeding? no BP elevated one time over weekend    No s/sx preeclampsia Objective  BP 120/80   Wt 202 lb (91.6 kg)   LMP 11/10/2020 (Exact Date)   BMI 31.64 kg/m  General: NAD Pumonary: no increased work of breathing Abdomen: gravid, non-tender Extremities: no edema Psychiatric: mood appropriate, affect full  Assessment  25 y.o. G2P1001 at [redacted]w[redacted]d by  08/17/2021, by Last Menstrual Period presenting for routine prenatal visit  Plan   Problem List Items Addressed This Visit      Other   Supervision of other normal pregnancy, antepartum  Other Visit Diagnoses    Encounter for supervision of other normal pregnancy in third trimester    -  Primary   [redacted] weeks gestation of pregnancy        PNV, FMC, Labs today Bottle feeding plans Plans Paraguard, also planning husband to have vasec     Nursing Staff Provider  Office Location  [facility] Dating  LMP, [place] confirmed  Language  English Anatomy US  nml  Flu Vaccine  04/28/21 Genetic Screen  NIPS: XX  TDaP vaccine    Hgb A1C or  GTT Third trimester :   Covid    LAB RESULTS   Rhogam  n/a Blood Type O/Positive/-- (07/07 1010)   Feeding Plan breast Antibody Negative (07/07 1010)  Contraception Paraguard, also planning vasec Rubella 3.33 (07/07 1010)  Circumcision n/a RPR Non Reactive (07/07 1010)   Pediatrician   HBsAg Negative (07/07 1010)   Support Person husband HIV Non Reactive (07/07 1010)  Prenatal Classes no Varicella Immune    GBS  (For PCN allergy, check sensitivities)   BTL Consent no    VBAC Consent n/a Pap  UTD            The following were addressed during this visit:  Breastfeeding Education - Early initiation of breastfeeding  - The importance of exclusive breastfeeding  - Risks of giving your baby anything other than breast milk if you are breastfeeding  - Nonpharmacological pain relief methods for labor  - The importance of early  skin-to-skin contact  - Rooming-in on a 24-hour basis  - Feeding on demand or baby-led feeding  - Frequent feeding to help assure optimal milk production  - Effective positioning and attachment  - Exclusive breastfeeding for the first 6 months  - Individualized Education - Pt desires to Bottle Feed  22-27 weeks - Childbirth Education  - Family Issues  - Second Trimester Labs (RPR/Hgb-Hct/Plt/HIV)  - Fetal Growth and Movement         Annamarie Major, MD, [facility], [facility] 05/26/2021  9:36 AM

## 2021-05-26 NOTE — Patient Instructions (Signed)

## 2021-05-27 LAB — 28 WEEK RH+PANEL
Basophils Absolute: 0 10*3/uL (ref 0.0–0.2)
Basos: 0 %
EOS (ABSOLUTE): 0.1 10*3/uL (ref 0.0–0.4)
Eos: 1 %
Gestational Diabetes Screen: 119 mg/dL (ref 70–139)
HIV Screen 4th Generation wRfx: NONREACTIVE
Hematocrit: 31.3 % — ABNORMAL LOW (ref 34.0–46.6)
Hemoglobin: 10.8 g/dL — ABNORMAL LOW (ref 11.1–15.9)
Immature Grans (Abs): 0.1 10*3/uL (ref 0.0–0.1)
Immature Granulocytes: 1 %
Lymphocytes Absolute: 1.6 10*3/uL (ref 0.7–3.1)
Lymphs: 17 %
MCH: 32.7 pg (ref 26.6–33.0)
MCHC: 34.5 g/dL (ref 31.5–35.7)
MCV: 95 fL (ref 79–97)
Monocytes Absolute: 0.6 10*3/uL (ref 0.1–0.9)
Monocytes: 6 %
Neutrophils Absolute: 6.8 10*3/uL (ref 1.4–7.0)
Neutrophils: 75 %
Platelets: 225 10*3/uL (ref 150–450)
RBC: 3.3 x10E6/uL — ABNORMAL LOW (ref 3.77–5.28)
RDW: 11.9 % (ref 11.7–15.4)
RPR Ser Ql: NONREACTIVE
WBC: 9.2 10*3/uL (ref 3.4–10.8)

## 2021-06-10 ENCOUNTER — Encounter: Payer: Self-pay | Admitting: Obstetrics & Gynecology

## 2021-06-10 ENCOUNTER — Ambulatory Visit (INDEPENDENT_AMBULATORY_CARE_PROVIDER_SITE_OTHER): Payer: Managed Care, Other (non HMO) | Admitting: Obstetrics & Gynecology

## 2021-06-10 ENCOUNTER — Other Ambulatory Visit: Payer: Self-pay

## 2021-06-10 VITALS — BP 100/70 | Wt 200.0 lb

## 2021-06-10 DIAGNOSIS — Z3A30 30 weeks gestation of pregnancy: Secondary | ICD-10-CM

## 2021-06-10 DIAGNOSIS — Z23 Encounter for immunization: Secondary | ICD-10-CM | POA: Diagnosis not present

## 2021-06-10 DIAGNOSIS — Z3483 Encounter for supervision of other normal pregnancy, third trimester: Secondary | ICD-10-CM

## 2021-06-10 DIAGNOSIS — Z3480 Encounter for supervision of other normal pregnancy, unspecified trimester: Secondary | ICD-10-CM

## 2021-06-10 NOTE — Progress Notes (Signed)
Prenatal Visit Note Date: 06/10/2021 Clinic: [facility]  Subjective:  Jennifer Nelson is a 25 y.o. G2P1001 at [redacted]w[redacted]d being seen today for ongoing prenatal care.  She is currently monitored for the following issues for this low-risk pregnancy and has Carpal tunnel syndrome during pregnancy; Atypical glandular cells of undetermined significance (AGUS) on cervical Pap smear; CIN III (cervical intraepithelial neoplasia grade III) with severe dysplasia; H/O LEEP; Supervision of other normal pregnancy, antepartum; and History of LEEP (loop electrosurgical excision procedure) of cervix complicating pregnancy on their problem list.  Patient reports no complaints.   Contractions: Not present. Vag. Bleeding: None.  Movement: Present. Denies leaking of fluid.   The following portions of the patient's history were reviewed and updated as appropriate: allergies, current medications, past family history, past medical history, past social history, past surgical history and problem list. Problem list updated.  Objective:   Vitals:   06/10/21 0954  BP: 100/70  Weight: 200 lb (90.7 kg)    Fetal Status:     Movement: Present     General:  Alert, oriented and cooperative. Patient is in no acute distress.  Skin: Skin is warm and dry. No rash noted.   Cardiovascular: Normal heart rate noted  Respiratory: Normal respiratory effort, no problems with respiration noted  Abdomen: Soft, gravid, appropriate for gestational age. Pain/Pressure: Absent     Pelvic:  Cervical exam deferred        Extremities: Normal range of motion.     Mental Status: Normal mood and affect. Normal behavior. Normal judgment and thought content.   Assessment and Plan:  Pregnancy: G2P1001 at [redacted]w[redacted]d 1. Encounter for supervision of other normal pregnancy in third trimester PNV, [facility]  2. Need for Tdap vaccination Today  3. [redacted] weeks gestation of pregnancy PTL precautions Delivery planning Plans to bottle feed,  Paraguard  Preterm labor symptoms and general obstetric precautions including but not limited to vaginal bleeding, contractions, leaking of fluid and fetal movement were reviewed in detail with the patient. Please refer to After Visit Summary for other counseling recommendations.   June 2022 Problems (from 01/08/21 to present)    Problem Noted Resolved   Supervision of other normal pregnancy, antepartum 01/28/2021 by Conard Novak, MD No   Overview Addendum 05/26/2021  9:36 AM by Nadara Mustard, MD     Nursing Staff Provider  Office Location  [facility] Dating  LMP, [place] confirmed  Language  English Anatomy US  nml  Flu Vaccine  04/28/21 Genetic Screen  NIPS: XX  TDaP vaccine   Today Hgb A1C or  GTT Third trimester : 119  Covid    LAB RESULTS   Rhogam  n/a Blood Type O/Positive/-- (07/07 1010)   Feeding Plan breast Antibody Negative (07/07 1010)  Contraception Paraguard, also planning vasec Rubella 3.33 (07/07 1010)  Circumcision n/a RPR Non Reactive (07/07 1010)   Pediatrician   HBsAg Negative (07/07 1010)   Support Person husband HIV Non Reactive (07/07 1010)  Prenatal Classes no Varicella Immune    GBS  (For PCN allergy, check sensitivities)   BTL Consent no    VBAC Consent n/a Pap  UTD           History of LEEP (loop electrosurgical excision procedure) of cervix complicating pregnancy 01/28/2021 by Conard Novak, MD No   Overview Signed 01/28/2021  9:24 AM by Conard Novak, MD    Cervical length checks starting at 6 weeks  Return in about 7 weeks (around 07/29/2021) for ROB ph.  Annamarie Major, MD, [facility], [facility] 06/10/2021  10:15 AM

## 2021-06-10 NOTE — Patient Instructions (Signed)
Tdap (Tetanus, Diphtheria, Pertussis) Vaccine: What You Need to Know 1. Why get vaccinated? Tdap vaccine can prevent tetanus, diphtheria, and pertussis. Diphtheria and pertussis spread from person to person. Tetanus enters the body through cuts or wounds. TETANUS (T) causes painful stiffening of the muscles. Tetanus can lead to serious health problems, including being unable to open the mouth, having trouble swallowing and breathing, or death. DIPHTHERIA (D) can lead to difficulty breathing, heart failure, paralysis, or death. PERTUSSIS (aP), also known as "whooping cough," can cause uncontrollable, violent coughing that makes it hard to breathe, eat, or drink. Pertussis can be extremely serious especially in babies and young children, causing pneumonia, convulsions, brain damage, or death. In teens and adults, it can cause weight loss, loss of bladder control, passing out, and rib fractures from severe coughing. 2. Tdap vaccine Tdap is only for children 7 years and older, adolescents, and adults.  Adolescents should receive a single dose of Tdap, preferably at age 11 or 12 years. Pregnant people should get a dose of Tdap during every pregnancy, preferably during the early part of the third trimester, to help protect the newborn from pertussis. Infants are most at risk for severe, life-threatening complications frompertussis. Adults who have never received Tdap should get a dose of Tdap. Also, adults should receive a booster dose of either Tdap or Td (a different vaccine that protects against tetanus and diphtheria but not pertussis) every 10 years, or after 5 years in the case of a severe or dirty wound or burn. Tdap may be given at the same time as other vaccines. 3. Talk with your health care provider Tell your vaccine provider if the person getting the vaccine: Has had an allergic reaction after a previous dose of any vaccine that protects against tetanus, diphtheria, or pertussis, or has any  severe, life-threatening allergies Has had a coma, decreased level of consciousness, or prolonged seizures within 7 days after a previous dose of any pertussis vaccine (DTP, DTaP, or Tdap) Has seizures or another nervous system problem Has ever had Guillain-Barr Syndrome (also called "GBS") Has had severe pain or swelling after a previous dose of any vaccine that protects against tetanus or diphtheria In some cases, your health care provider may decide to postpone Tdapvaccination until a future visit. People with minor illnesses, such as a cold, may be vaccinated. People who are moderately or severely ill should usually wait until they recover beforegetting Tdap vaccine.  Your health care provider can give you more information. 4. Risks of a vaccine reaction Pain, redness, or swelling where the shot was given, mild fever, headache, feeling tired, and nausea, vomiting, diarrhea, or stomachache sometimes happen after Tdap vaccination. People sometimes faint after medical procedures, including vaccination. Tellyour provider if you feel dizzy or have vision changes or ringing in the ears.  As with any medicine, there is a very remote chance of a vaccine causing asevere allergic reaction, other serious injury, or death. 5. What if there is a serious problem? An allergic reaction could occur after the vaccinated person leaves the clinic. If you see signs of a severe allergic reaction (hives, swelling of the face and throat, difficulty breathing, a fast heartbeat, dizziness, or weakness), call 9-1-1and get the person to the nearest hospital. For other signs that concern you, call your health care provider.  Adverse reactions should be reported to the Vaccine Adverse Event Reporting System (VAERS). Your health care provider will usually file this report, or you can do it yourself. Visit the   VAERS website at www.vaers.hhs.gov or call 1-800-822-7967. VAERS is only for reporting reactions, and VAERS staff  members do not give medical advice. 6. The National Vaccine Injury Compensation Program The National Vaccine Injury Compensation Program (VICP) is a federal program that was created to compensate people who may have been injured by certain vaccines. Claims regarding alleged injury or death due to vaccination have a time limit for filing, which may be as short as two years. Visit the VICP website at www.hrsa.gov/vaccinecompensation or call 1-800-338-2382to learn about the program and about filing a claim. 7. How can I learn more? Ask your health care provider. Call your local or state health department. Visit the website of the Food and Drug Administration (FDA) for vaccine package inserts and additional information at www.fda.gov/vaccines-blood-biologics/vaccines. Contact the Centers for Disease Control and Prevention (CDC): Call 1-800-232-4636 (1-800-CDC-INFO) or Visit CDC's website at www.cdc.gov/vaccines. Vaccine Information Statement Tdap (Tetanus, Diphtheria, Pertussis) Vaccine(02/28/2020) This information is not intended to replace advice given to you by your health care provider. Make sure you discuss any questions you have with your healthcare provider. Document Revised: 03/25/2020 Document Reviewed: 03/25/2020 Elsevier Patient Education  2022 Elsevier Inc.  

## 2021-06-23 ENCOUNTER — Other Ambulatory Visit: Payer: Self-pay

## 2021-06-23 ENCOUNTER — Ambulatory Visit (INDEPENDENT_AMBULATORY_CARE_PROVIDER_SITE_OTHER): Payer: Managed Care, Other (non HMO) | Admitting: Obstetrics & Gynecology

## 2021-06-23 ENCOUNTER — Encounter: Payer: Self-pay | Admitting: Obstetrics & Gynecology

## 2021-06-23 VITALS — BP 120/80 | Wt 209.0 lb

## 2021-06-23 DIAGNOSIS — Z3483 Encounter for supervision of other normal pregnancy, third trimester: Secondary | ICD-10-CM

## 2021-06-23 DIAGNOSIS — Z3A32 32 weeks gestation of pregnancy: Secondary | ICD-10-CM

## 2021-06-23 LAB — POCT URINALYSIS DIPSTICK OB
Glucose, UA: NEGATIVE
POC,PROTEIN,UA: NEGATIVE

## 2021-06-23 NOTE — Addendum Note (Signed)
Addended by: Cornelius Moras D on: 06/23/2021 11:08 AM   Modules accepted: Orders

## 2021-06-23 NOTE — Progress Notes (Signed)
  Subjective  Fetal Movement? yes Contractions? no Leaking Fluid? no Vaginal Bleeding? no  Objective  BP 120/80   Wt 209 lb (94.8 kg)   LMP 11/10/2020 (Exact Date)   BMI 32.73 kg/m  General: NAD Pumonary: no increased work of breathing Abdomen: gravid, non-tender Extremities: no edema Psychiatric: mood appropriate, affect full  Assessment  25 y.o. G2P1001 at [redacted]w[redacted]d by  08/17/2021, by Last Menstrual Period presenting for routine prenatal visit  Plan   Problem List Items Addressed This Visit   None Visit Diagnoses    Encounter for supervision of other normal pregnancy in third trimester    -  Primary   [redacted] weeks gestation of pregnancy        PNV, [facility]  June 2022 Problems (from 01/08/21 to present)    Problem Noted Resolved   Supervision of other normal pregnancy, antepartum 01/28/2021 by Conard Novak, MD No   Overview Addendum 06/10/2021 10:17 AM by Nadara Mustard, MD     Nursing Staff Provider  Office Location  [facility] Dating  LMP, [place] confirmed  Language  English Anatomy US  nml  Flu Vaccine  04/28/21 Genetic Screen  NIPS: XX  TDaP vaccine   06/10/2021 Hgb A1C or  GTT Third trimester : 119  Covid    LAB RESULTS   Rhogam  n/a Blood Type O/Positive/-- (07/07 1010)   Feeding Plan breast Antibody Negative (07/07 1010)  Contraception Paraguard, also planning vasec Rubella 3.33 (07/07 1010)  Circumcision n/a RPR Non Reactive (11/02 1027)   Pediatrician   HBsAg Negative (07/07 1010)   Support Person husband HIV Non Reactive (11/02 1027)  Prenatal Classes no Varicella Immune    GBS  (For PCN allergy, check sensitivities)   BTL Consent no    VBAC Consent n/a Pap  UTD           History of LEEP (loop electrosurgical excision procedure) of cervix complicating pregnancy 01/28/2021 by Conard Novak, MD No   Overview Signed 01/28/2021  9:24 AM by Conard Novak, MD    Cervical length checks starting at 6 weeks          Annamarie Major, MD, [facility],  [facility] 06/23/2021  11:05 AM

## 2021-06-23 NOTE — Patient Instructions (Signed)
Recommendations to boost your immunity to prevent illness such as viral flu and colds, including covid19, are as follows: °      - - -  Vitamin K2 and Vitamin D3  - - - °Take Vitamin K2 at 200-300 mcg daily (usually 2-3 pills daily of the over the counter formulation). °Take Vitamin D3 at 3000-4000 U daily (usually 3-4 pills daily of the over the counter formulation). °Studies show that these two at high normal levels in your system are very effective in keeping your immunity so strong and protective that you will be unlikely to contract viral illness such as those listed above. ° °Dr Layman Gully ° °

## 2021-07-07 ENCOUNTER — Encounter: Payer: Self-pay | Admitting: Obstetrics & Gynecology

## 2021-07-07 ENCOUNTER — Ambulatory Visit (INDEPENDENT_AMBULATORY_CARE_PROVIDER_SITE_OTHER): Payer: Managed Care, Other (non HMO) | Admitting: Obstetrics & Gynecology

## 2021-07-07 ENCOUNTER — Other Ambulatory Visit: Payer: Self-pay

## 2021-07-07 VITALS — BP 120/80 | Wt 203.0 lb

## 2021-07-07 DIAGNOSIS — Z3A34 34 weeks gestation of pregnancy: Secondary | ICD-10-CM

## 2021-07-07 DIAGNOSIS — O3443 Maternal care for other abnormalities of cervix, third trimester: Secondary | ICD-10-CM

## 2021-07-07 DIAGNOSIS — Z3483 Encounter for supervision of other normal pregnancy, third trimester: Secondary | ICD-10-CM

## 2021-07-07 DIAGNOSIS — Z9889 Other specified postprocedural states: Secondary | ICD-10-CM

## 2021-07-07 LAB — POCT URINALYSIS DIPSTICK OB
Glucose, UA: NEGATIVE
POC,PROTEIN,UA: NEGATIVE

## 2021-07-07 NOTE — Progress Notes (Signed)
°  Subjective  Fetal Movement? yes Contractions? no Leaking Fluid? no Vaginal Bleeding? no  Objective  BP 120/80    Wt 203 lb (92.1 kg)    LMP 11/10/2020 (Exact Date)    BMI 31.79 kg/m  General: NAD Pumonary: no increased work of breathing Abdomen: gravid, non-tender Extremities: no edema Psychiatric: mood appropriate, affect full  Assessment  25 y.o. G2P1001 at [redacted]w[redacted]d by  08/17/2021, by Last Menstrual Period presenting for routine prenatal visit  Plan   Problem List Items Addressed This Visit      Genitourinary   History of LEEP (loop electrosurgical excision procedure) of cervix complicating pregnancy  Other Visit Diagnoses    Encounter for supervision of other normal pregnancy in third trimester    -  Primary   Relevant Orders   POC Urinalysis Dipstick OB (Completed)   [redacted] weeks gestation of pregnancy        PNV, [facility] GBS nv June 2022 Problems (from 01/08/21 to present)    Problem Noted Resolved   Supervision of other normal pregnancy, antepartum 01/28/2021 by Conard Novak, MD No   Overview Addendum 06/10/2021 10:17 AM by Nadara Mustard, MD     Nursing Staff Provider  Office Location  [facility] Dating  LMP, [place] confirmed  Language  English Anatomy US  nml  Flu Vaccine  04/28/21 Genetic Screen  NIPS: XX  TDaP vaccine   06/10/2021 Hgb A1C or  GTT Third trimester : 119  Covid    LAB RESULTS   Rhogam  n/a Blood Type O/Positive/-- (07/07 1010)   Feeding Plan breast Antibody Negative (07/07 1010)  Contraception Paraguard, also planning vasec Rubella 3.33 (07/07 1010)  Circumcision n/a RPR Non Reactive (11/02 1027)   Pediatrician   HBsAg Negative (07/07 1010)   Support Person husband HIV Non Reactive (11/02 1027)  Prenatal Classes no Varicella Immune    GBS  (For PCN allergy, check sensitivities)   BTL Consent no    VBAC Consent n/a Pap  UTD           History of LEEP (loop electrosurgical excision procedure) of cervix complicating pregnancy 01/28/2021 by Conard Novak, MD No   Overview Signed 01/28/2021  9:24 AM by Conard Novak, MD    Cervical length checks starting at 6 weeks        Annamarie Major, MD, [facility], [facility] 07/07/2021  10:00 AM

## 2021-07-07 NOTE — Patient Instructions (Signed)
Thank you for choosing [facility]. As part of our ongoing efforts to improve patient experience, we would appreciate your feedback. Please fill out the short survey that you will receive by mail or MyChart. Your opinion is important to [place]! -Dr Caroline Sauger Contractions Contractions of the uterus can occur throughout pregnancy, but they are not always a sign that you are in labor. You may have practice contractions called Braxton Hicks contractions. These false labor contractions are sometimes confused with true labor. What are Deberah Pelton contractions? Braxton Hicks contractions are tightening movements that occur in the muscles of the uterus before labor. Unlike true labor contractions, these contractions do not result in opening (dilation) and thinning of the lowest part of the uterus (cervix). Toward the end of pregnancy (32-34 weeks), Braxton Hicks contractions can happen more often and may become stronger. These contractions are sometimes difficult to tell apart from true labor because they can be very uncomfortable. How to tell the difference between true labor and false labor True labor Contractions last 30-70 seconds. Contractions become very regular. Discomfort is usually felt in the top of the uterus, and it spreads to the lower abdomen and low back. Contractions do not go away with walking. Contractions usually become stronger and more frequent. The cervix dilates and gets thinner. False labor Contractions are usually shorter, weaker, and farther apart than true labor contractions. Contractions are usually irregular. Contractions are often felt in the front of the lower abdomen and in the groin. Contractions may go away when you walk around or change positions while lying down. The cervix usually does not dilate or become thin. Sometimes, the only way to tell if you are in true labor is for your health care provider to look for changes in your cervix. Your health care  provider will do a physical exam and may monitor your contractions. If you are in true labor, your health care provider will send you home with instructions about when to return to the hospital. You may continue to have Braxton Hicks contractions until you go into true labor. Follow these instructions at home:  Take over-the-counter and prescription medicines only as told by your health care provider. If Braxton Hicks contractions are making you uncomfortable: Change your position from lying down or resting to walking, or change from walking to resting. Sit and rest in a tub of warm water. Drink enough fluid to keep your urine pale yellow. Dehydration may cause these contractions. Do slow and deep breathing several times an hour. Keep all follow-up visits. This is important. Contact a health care provider if: You have a fever. You have continuous pain in your abdomen. Your contractions become stronger, more regular, and closer together. You pass blood-tinged mucus. Get help right away if: You have fluid leaking or gushing from your vagina. You have bright red blood coming from your vagina. Your baby is not moving inside you as much as it used to. Summary You may have practice contractions called Braxton Hicks contractions. These false labor contractions are sometimes confused with true labor. Braxton Hicks contractions are usually shorter, weaker, farther apart, and less regular than true labor contractions. True labor contractions usually become stronger, more regular, and more frequent. Manage discomfort from [facility] contractions by changing position, resting in a warm bath, practicing deep breathing, and drinking plenty of water. Keep all follow-up visits. Contact your health care provider if your contractions become stronger, more regular, and closer together. This information is not intended to  replace advice given to you by your health care provider. Make sure you discuss any  questions you have with your health care provider. Document Revised: 05/18/2020 Document Reviewed: 05/18/2020 Elsevier Patient Education  2022 [employer].

## 2021-07-22 ENCOUNTER — Other Ambulatory Visit: Payer: Self-pay

## 2021-07-22 ENCOUNTER — Encounter: Payer: Self-pay | Admitting: Obstetrics & Gynecology

## 2021-07-22 ENCOUNTER — Ambulatory Visit (INDEPENDENT_AMBULATORY_CARE_PROVIDER_SITE_OTHER): Payer: Managed Care, Other (non HMO) | Admitting: Obstetrics & Gynecology

## 2021-07-22 VITALS — BP 120/80 | Wt 210.0 lb

## 2021-07-22 DIAGNOSIS — Z3A36 36 weeks gestation of pregnancy: Secondary | ICD-10-CM

## 2021-07-22 DIAGNOSIS — Z3483 Encounter for supervision of other normal pregnancy, third trimester: Secondary | ICD-10-CM

## 2021-07-22 DIAGNOSIS — Z3685 Encounter for antenatal screening for Streptococcus B: Secondary | ICD-10-CM

## 2021-07-22 LAB — POCT URINALYSIS DIPSTICK OB
Glucose, UA: NEGATIVE
POC,PROTEIN,UA: NEGATIVE

## 2021-07-22 NOTE — Addendum Note (Signed)
Addended by: Cornelius Moras D on: 07/22/2021 10:40 AM   Modules accepted: Orders

## 2021-07-22 NOTE — Progress Notes (Signed)
°  Subjective  Fetal Movement? yes Contractions? no Leaking Fluid? no Vaginal Bleeding? no  Objective  BP 120/80    Wt 210 lb (95.3 kg)    LMP 11/10/2020 (Exact Date)    BMI 32.89 kg/m  General: NAD Pumonary: no increased work of breathing Abdomen: gravid, non-tender Extremities: no edema Psychiatric: mood appropriate, affect full  Assessment  25 y.o. G2P1001 at [redacted]w[redacted]d by  08/17/2021, by Last Menstrual Period presenting for routine prenatal visit  Plan   Problem List Items Addressed This Visit   None Visit Diagnoses    Encounter for supervision of other normal pregnancy in third trimester    -  Primary   [redacted] weeks gestation of pregnancy       Encounter for antenatal screening for Streptococcus B       Relevant Orders   Culture, beta strep (group b only)    GBS, PNV, [facility]  June 2022 Problems (from 01/08/21 to present)    Problem Noted Resolved   Supervision of other normal pregnancy, antepartum 01/28/2021 by Conard Novak, MD No   Overview Addendum 06/10/2021 10:17 AM by Nadara Mustard, MD     Nursing Staff Provider  Office Location  [facility] Dating  LMP, [place] confirmed  Language  English Anatomy US  nml  Flu Vaccine  04/28/21 Genetic Screen  NIPS: XX  TDaP vaccine   06/10/2021 Hgb A1C or  GTT Third trimester : 119  Covid    LAB RESULTS   Rhogam  n/a Blood Type O/Positive/-- (07/07 1010)   Feeding Plan breast Antibody Negative (07/07 1010)  Contraception Paraguard, also planning vasec Rubella 3.33 (07/07 1010)  Circumcision n/a RPR Non Reactive (11/02 1027)   Pediatrician   HBsAg Negative (07/07 1010)   Support Person husband HIV Non Reactive (11/02 1027)  Prenatal Classes no Varicella Immune    GBS  (For PCN allergy, check sensitivities)   BTL Consent no    VBAC Consent n/a Pap  UTD           History of LEEP (loop electrosurgical excision procedure) of cervix complicating pregnancy 01/28/2021 by Conard Novak, MD No   Overview Signed 01/28/2021  9:24 AM by  Conard Novak, MD    Cervical length checks starting at 6 weeks          Annamarie Major, MD, [facility], [facility] 07/22/2021  10:37 AM

## 2021-07-22 NOTE — Patient Instructions (Signed)

## 2021-07-25 NOTE — L&D Delivery Note (Signed)
Delivery Note Primary OB: [facility] Delivery Physician: Annamarie Major, MD Gestational Age: Full term Antepartum complications: none Intrapartum complications: None  A viable Female was delivered via vertex presentation. "Madelyn" Apgars:9 ,9  Weight:  pending   Placenta status: spontaneous and Intact.  Cord: 3+ vessels;  with the following complications: none.  Anesthesia:  epidural Episiotomy:  none Lacerations:  1st Suture Repair: 2.0 vicryl Est. Blood Loss (mL):   700 mL  Mom to postpartum.  Baby to Couplet care / Skin to Skin.  Annamarie Major, MD, [facility], [facility] 08/20/2021  4:33 AM [phone]

## 2021-07-26 LAB — CULTURE, BETA STREP (GROUP B ONLY): Strep Gp B Culture: POSITIVE — AB

## 2021-07-29 ENCOUNTER — Encounter: Payer: Self-pay | Admitting: Obstetrics & Gynecology

## 2021-07-29 ENCOUNTER — Other Ambulatory Visit: Payer: Self-pay

## 2021-07-29 ENCOUNTER — Ambulatory Visit (INDEPENDENT_AMBULATORY_CARE_PROVIDER_SITE_OTHER): Payer: Managed Care, Other (non HMO) | Admitting: Obstetrics & Gynecology

## 2021-07-29 VITALS — BP 120/80 | Wt 205.0 lb

## 2021-07-29 DIAGNOSIS — Z3A37 37 weeks gestation of pregnancy: Secondary | ICD-10-CM

## 2021-07-29 DIAGNOSIS — Z3483 Encounter for supervision of other normal pregnancy, third trimester: Secondary | ICD-10-CM

## 2021-07-29 LAB — POCT URINALYSIS DIPSTICK OB
Glucose, UA: NEGATIVE
POC,PROTEIN,UA: NEGATIVE

## 2021-07-29 NOTE — Patient Instructions (Signed)
First Stage of Labor ?Labor is your body's natural process of moving your baby and other structures, including the placenta and umbilical cord, out of your uterus. There are three stages of labor. How long each stage lasts is different for every person. However, certain events happen during each stage that are the same for everyone. ?The first stage starts when true labor begins. This stage ends when your cervix, which is the opening from your uterus into your vagina, is completely open (dilated). Once your cervix is fully dilated you will be ready to enter the second stage of labor and start pushing. ?First stage of labor ?The first stage of labor begins when you start having regular painful contractions that come at evenly spaced intervals. During this stage, your cervix starts to get thinner and opens in preparation for your baby to pass through. Health care providers measure the dilation of your cervix in centimeters (cm). One centimeter is a little less than one-half of an inch. The first stage ends when your cervix is dilated to 10 cm. The first stage of labor is divided into three phases: ?Early phase. ?Active phase. ?Transitional phase. ?The length of the first stage of labor varies. It may be longer if this is your first pregnancy. You may spend some of this stage at home trying to relax and stay comfortable. ?How does this affect me? ?During the first stage of labor, you will move through three phases. ?What happens in the early phase? ?You will start to have regular contractions that last 30-60 seconds. Contractions may come every 5-20 minutes. Keep track of your contractions and call your health care provider. ?Your water may break (rupture of membranes). This is when the sac of fluid that surrounds your baby breaks. ?You may notice a clear or slightly bloody discharge of mucus (mucus plug) from your vagina. ?What happens in the active phase? ?The active phase usually lasts 3-5 hours. You may go to the  hospital or birth center around this time. During the active phase: ?Your contractions will become stronger, longer, and more uncomfortable. ?Your contractions may last 45-90 seconds and come every 3-5 minutes. ?Your contractions will be painful. Most people feel abdominal pain with contractions, but you may also feel lower back pain. ?What happens in the transitional phase? ?The transitional phase typically lasts from 30 minutes to 2 hours. At the end of this phase, your cervix will be fully dilated to 10 cm. During the transitional phase: ?Contractions will get stronger, longer, and more intense. ?Contractions may last 60-90 seconds and come less than 2 minutes apart. ?You may feel hot flashes, chills, or nausea. ?How does this affect my baby? ?During the first stage of labor, your baby will gradually move down into your birth canal. ?Follow these instructions at home: ? ?When labor first begins, try to stay calm. You are still in the early phase. During this time it's important to rest and stay hydrated. You may want to make some calls and get ready to go to the hospital or birth center. ?When you are in the early phase, try these methods to help ease discomfort: ?Deep breathing and muscle relaxation. ?Taking a walk. ?Taking a warm bath or shower. ?Drink some fluids and have a light snack if you feel like it. ?Keep track of your contractions. ?Based on the plan you created with your health care provider, call when your contractions indicate it is time. ?If your water breaks, note the time, color, and odor of the fluid. ?  Contact a health care provider if: ?Your contractions are strong and regular. ?You have lower back pain or cramping. ?Your water breaks. ?You lose your mucus plug. ?Get help right away if: ?You have a severe headache that does not go away. ?You have changes in your vision. ?You have severe pain in your upper belly. ?You do not feel the baby move. ?You have bright red bleeding. ?Summary ?The first  stage of labor starts when true labor begins, and it ends when your cervix is dilated to 10 cm. ?The first stage of labor has three phases: early, active, and transitional. ?Your baby moves into the birth canal during the first stage of labor. ?Call your health care provider if you think your labor is starting. ?This information is not intended to replace advice given to you by your health care provider. Make sure you discuss any questions you have with your health care provider. ?Document Revised: 11/24/2020 Document Reviewed: 11/24/2020 ?Elsevier Patient Education ? 2022 Elsevier Inc. ? ?

## 2021-07-29 NOTE — Progress Notes (Signed)
°  Subjective  Fetal Movement? yes Contractions? no Leaking Fluid? no Vaginal Bleeding? no  Objective  BP 120/80    Wt 205 lb (93 kg)    LMP 11/10/2020 (Exact Date)    BMI 32.11 kg/m  General: NAD Pumonary: no increased work of breathing Abdomen: gravid, non-tender Extremities: no edema Psychiatric: mood appropriate, affect full  Assessment  26 y.o. G2P1001 at [redacted]w[redacted]d by  08/17/2021, by Last Menstrual Period presenting for routine prenatal visit  Plan   Problem List Items Addressed This Visit   None Visit Diagnoses    Encounter for supervision of other normal pregnancy in third trimester    -  Primary   [redacted] weeks gestation of pregnancy        PNV, [facility], Labor precautions Prefers to wait on IOL until 41 weeks  June 2022 Problems (from 01/08/21 to present)    Problem Noted Resolved   Supervision of other normal pregnancy, antepartum 01/28/2021 by Conard Novak, MD No   Overview Addendum 06/10/2021 10:17 AM by Nadara Mustard, MD     Nursing Staff Provider  Office Location  [facility] Dating  LMP, [place] confirmed  Language  English Anatomy US  nml  Flu Vaccine  04/28/21 Genetic Screen  NIPS: XX  TDaP vaccine   06/10/2021 Hgb A1C or  GTT Third trimester : 119  Covid    LAB RESULTS   Rhogam  n/a Blood Type O/Positive/-- (07/07 1010)   Feeding Plan breast Antibody Negative (07/07 1010)  Contraception Paraguard, also planning vasec Rubella 3.33 (07/07 1010)  Circumcision n/a RPR Non Reactive (11/02 1027)   Pediatrician   HBsAg Negative (07/07 1010)   Support Person husband HIV Non Reactive (11/02 1027)  Prenatal Classes no Varicella Immune    GBS  (For PCN allergy, check sensitivities)   BTL Consent no    VBAC Consent n/a Pap  UTD           History of LEEP (loop electrosurgical excision procedure) of cervix complicating pregnancy 01/28/2021 by Conard Novak, MD No   Overview Signed 01/28/2021  9:24 AM by Conard Novak, MD    Cervical length checks starting at 6 weeks           Annamarie Major, MD, [facility], [facility] 07/29/2021  10:34 AM

## 2021-07-29 NOTE — Addendum Note (Signed)
Addended by: Cornelius Moras D on: 07/29/2021 10:43 AM   Modules accepted: Orders

## 2021-08-06 ENCOUNTER — Encounter: Payer: Self-pay | Admitting: Obstetrics & Gynecology

## 2021-08-06 ENCOUNTER — Ambulatory Visit (INDEPENDENT_AMBULATORY_CARE_PROVIDER_SITE_OTHER): Payer: Managed Care, Other (non HMO) | Admitting: Obstetrics & Gynecology

## 2021-08-06 ENCOUNTER — Other Ambulatory Visit: Payer: Self-pay

## 2021-08-06 VITALS — BP 120/80 | Wt 207.0 lb

## 2021-08-06 DIAGNOSIS — Z3483 Encounter for supervision of other normal pregnancy, third trimester: Secondary | ICD-10-CM

## 2021-08-06 DIAGNOSIS — Z3A38 38 weeks gestation of pregnancy: Secondary | ICD-10-CM

## 2021-08-06 LAB — POCT URINALYSIS DIPSTICK OB
Glucose, UA: NEGATIVE
POC,PROTEIN,UA: NEGATIVE

## 2021-08-06 NOTE — Progress Notes (Signed)
°  Subjective  Fetal Movement? yes Contractions? no Leaking Fluid? no Vaginal Bleeding? no  Objective  BP 120/80    Wt 207 lb (93.9 kg)    LMP 11/10/2020 (Exact Date)    BMI 32.42 kg/m  General: NAD Pumonary: no increased work of breathing Abdomen: gravid, non-tender Extremities: no edema Psychiatric: mood appropriate, affect full  Assessment  26 y.o. G2P1001 at [redacted]w[redacted]d by  08/17/2021, by Last Menstrual Period presenting for routine prenatal visit  Plan   Problem List Items Addressed This Visit   None Visit Diagnoses    Encounter for supervision of other normal pregnancy in third trimester    -  Primary   [redacted] weeks gestation of pregnancy          June 2022 Problems (from 01/08/21 to present)    Problem Noted Resolved   Supervision of other normal pregnancy, antepartum 01/28/2021 by Conard Novak, MD No   Overview Addendum 06/10/2021 10:17 AM by Nadara Mustard, MD     Nursing Staff Provider  Office Location  [facility] Dating  LMP, [place] confirmed  Language  English Anatomy US  nml  Flu Vaccine  04/28/21 Genetic Screen  NIPS: XX  TDaP vaccine   06/10/2021 Hgb A1C or  GTT Third trimester : 119  Covid    LAB RESULTS   Rhogam  n/a Blood Type O/Positive/-- (07/07 1010)   Feeding Plan breast Antibody Negative (07/07 1010)  Contraception Paraguard, also planning vasec Rubella 3.33 (07/07 1010)  Circumcision n/a RPR Non Reactive (11/02 1027)   Pediatrician   HBsAg Negative (07/07 1010)   Support Person husband HIV Non Reactive (11/02 1027)  Prenatal Classes no Varicella Immune    GBS  (For PCN allergy, check sensitivities)   BTL Consent no    VBAC Consent n/a Pap  UTD           History of LEEP (loop electrosurgical excision procedure) of cervix complicating pregnancy 01/28/2021 by Conard Novak, MD No   Overview Signed 01/28/2021  9:24 AM by Conard Novak, MD    Cervical length checks starting at 6 weeks        Prefers IOL only at 41 weeks PNV, [facility], Labor  precautions  Annamarie Major, MD, [facility], [facility] 08/06/2021  11:42 AM

## 2021-08-06 NOTE — Addendum Note (Signed)
Addended by: Cornelius Moras D on: 08/06/2021 11:46 AM   Modules accepted: Orders

## 2021-08-11 ENCOUNTER — Ambulatory Visit (INDEPENDENT_AMBULATORY_CARE_PROVIDER_SITE_OTHER): Payer: Managed Care, Other (non HMO) | Admitting: Obstetrics & Gynecology

## 2021-08-11 ENCOUNTER — Other Ambulatory Visit: Payer: Self-pay

## 2021-08-11 ENCOUNTER — Encounter: Payer: Self-pay | Admitting: Obstetrics & Gynecology

## 2021-08-11 VITALS — BP 120/80 | Wt 204.0 lb

## 2021-08-11 DIAGNOSIS — Z3483 Encounter for supervision of other normal pregnancy, third trimester: Secondary | ICD-10-CM

## 2021-08-11 DIAGNOSIS — Z3A39 39 weeks gestation of pregnancy: Secondary | ICD-10-CM

## 2021-08-11 NOTE — Patient Instructions (Signed)
HAPPY BIRTHDAY!!!  Labor Induction 08/19/21 at 0800  Labor induction is when steps are taken to cause a pregnant woman to begin the labor process. Most women go into labor on their own between 37 weeks and 42 weeks of pregnancy. When this does not happen, or when there is a medical need for labor to begin, steps may be taken to induce, or bring on, labor. Labor induction causes a pregnant woman's uterus to contract. It also causes the cervix to soften (ripen), open (dilate), and thin out. Usually, labor is not induced before 39 weeks of pregnancy unless there is a medical reason to do so. When is labor induction considered? Labor induction may be right for you if: Your pregnancy lasts longer than 41 to 42 weeks. Your placenta is separating from your uterus (placental abruption). You have a rupture of membranes and your labor does not begin. You have health problems, like diabetes or high blood pressure (preeclampsia) during your pregnancy. Your baby has stopped growing or does not have enough amniotic fluid. Before labor induction begins, your health care provider will consider the following factors: Your medical condition and the baby's condition. How many weeks you have been pregnant. How mature the baby's lungs are. The condition of your cervix. The position of the baby. The size of your birth canal. Tell a health care provider about: Any allergies you have. All medicines you are taking, including vitamins, herbs, eye drops, creams, and over-the-counter medicines. Any problems you or your family members have had with anesthetic medicines. Any surgeries you have had. Any blood disorders you have. Any medical conditions you have. What are the risks? Generally, this is a safe procedure. However, problems may occur, including: Failed induction. Changes in fetal heart rate, such as being too high, too low, or irregular (erratic). Infection in the mother or the baby. Increased risk of  having a cesarean delivery. Breaking off (abruption) of the placenta from the uterus. This is rare. Rupture of the uterus. This is very rare. Your baby could fail to get enough blood flow or oxygen. This can be life-threatening. When induction is needed for medical reasons, the benefits generally outweigh the risks. What happens during the procedure? During the procedure, your health care provider will use one of these methods to induce labor: Stripping the membranes. In this method, the amniotic sac tissue is gently separated from the cervix. This causes the following to happen: Your cervix stretches, which in turn causes the release of prostaglandins. Prostaglandins induce labor and cause the uterus to contract. This procedure is often done in an office visit. You will be sent home to wait for contractions to begin. Prostaglandin medicine. This medicine starts contractions and causes the cervix to dilate and ripen. This can be taken by mouth (orally) or by being inserted into the vagina (suppository). Inserting a small, thin tube (catheter) with a balloon into the vagina and then expanding the balloon with water to dilate the cervix. Breaking the water. In this method, a small instrument is used to make a small hole in the amniotic sac. This eventually causes the amniotic sac to break. Contractions should begin within a few hours. Medicine to trigger or strengthen contractions. This medicine is given through an IV that is inserted into a vein in your arm. This procedure may vary among health care providers and hospitals. Where to find more information March of Dimes: www.marchofdimes.org [employer] of Obstetricians and Gynecologists: www.acog.org Summary Labor induction causes a pregnant woman's uterus  to contract. It also causes the cervix to soften (ripen), open (dilate), and thin out. Labor is usually not induced before 39 weeks of pregnancy unless there is a medical reason to do  so. When induction is needed for medical reasons, the benefits generally outweigh the risks. Talk with your health care provider about which methods of labor induction are right for you. This information is not intended to replace advice given to you by your health care provider. Make sure you discuss any questions you have with your health care provider. Document Revised: 04/23/2020 Document Reviewed: 04/23/2020 Elsevier Patient Education  2022 [employer].

## 2021-08-11 NOTE — Progress Notes (Signed)
°  Subjective  Fetal Movement? yes Contractions? Occas BHs Leaking Fluid? no Vaginal Bleeding? no  Objective  BP 120/80    Wt 204 lb (92.5 kg)    LMP 11/10/2020 (Exact Date)    BMI 31.95 kg/m  General: NAD Pumonary: no increased work of breathing Abdomen: gravid, non-tender Extremities: no edema Psychiatric: mood appropriate, affect full  Assessment  26 y.o. G2P1001 at 103w1d by  08/17/2021, by Last Menstrual Period presenting for routine prenatal visit  Plan   Problem List Items Addressed This Visit   None Visit Diagnoses     Encounter for supervision of other normal pregnancy in third trimester    -  Primary   Relevant Orders   POC Urinalysis Dipstick OB   [redacted] weeks gestation of pregnancy         PNV, [facility], Labor precautions IOL sch for 08/09/21  June 2022 Problems (from 01/08/21 to present)     Problem Noted Resolved   Supervision of other normal pregnancy, antepartum 01/28/2021 by Conard Novak, MD No   Overview Addendum 06/10/2021 10:17 AM by Nadara Mustard, MD     Nursing Staff Provider  Office Location  [facility] Dating  LMP, [place] confirmed  Language  English Anatomy US  nml  Flu Vaccine  04/28/21 Genetic Screen  NIPS: XX  TDaP vaccine   06/10/2021 Hgb A1C or  GTT Third trimester : 119  Covid    LAB RESULTS   Rhogam  n/a Blood Type O/Positive/-- (07/07 1010)   Feeding Plan breast Antibody Negative (07/07 1010)  Contraception Paraguard, also planning vasec Rubella 3.33 (07/07 1010)  Circumcision n/a RPR Non Reactive (11/02 1027)   Pediatrician   HBsAg Negative (07/07 1010)   Support Person husband HIV Non Reactive (11/02 1027)  Prenatal Classes no Varicella Immune    GBS  (For PCN allergy, check sensitivities)   BTL Consent no    VBAC Consent n/a Pap  UTD           History of LEEP (loop electrosurgical excision procedure) of cervix complicating pregnancy 01/28/2021 by Conard Novak, MD No   Overview Signed 01/28/2021  9:24 AM by Conard Novak, MD     Cervical length checks starting at 6 weeks           Annamarie Major, MD, [facility], [facility] 08/11/2021  11:16 AM

## 2021-08-11 NOTE — Progress Notes (Signed)
°  [facility] REGIONAL BIRTHPLACE INDUCTION ASSESSMENT Jennifer Nelson Jennifer Nelson Oct 11, 1995 Medical record #: [id number] Phone #:  Home Phone [phone]  Mobile [phone]    Prenatal Provider:[facility] Delivering Group:[facility] Proposed admission date/time:08/19/21 0800 Method of induction:Cytotec  Weight: Filed Weights01/18/23 1035Weight:204 lb (92.5 kg) BMI Body mass index is 31.95 kg/m. HIV Negative HSV Negative EDC Estimated Date of Delivery: 1/24/23based on:LMP  Gestational age on admission: 45 Gravidity/parity:G2P1001  Cervix Score   0 1 2 3   Position Posterior Midposition Anterior   Consistency Firm Medium Soft   Effacement (%) 0-30 40-50 60-70 >80  Dilation (cm) Closed 1-2 3-4 >5  Babys station -3 -2 -1 +1, +2   Bishop Score:4   Medical induction of labor  select indication(s) below Elective induction ?39 weeks multiparous patient ?39 weeks primiparous patient with Bishop score ?7 ?40 weeks primiparous patient   Medical Indications Adapted from [facility] #[id number], Medically Indicated Late Preterm and Early Term Deliveries, 2013.  PLACENTAL / UTERINE ISSUES FETAL ISSUES MATERNAL ISSUES  ? Placenta previa (36.0-37.6) ? Isoimmunization (37.0-38.6) ? Preeclampsia without severe features or gestational HTN (37.0)  ? Suspected accreta (34.0-35.6) ? Growth Restriction [name]) ? Preeclampsia with severe features (34.0)  ? Prior classical CD, uterine window, rupture (36.0-37.6) ? Isolated (38.0-39.6) ? Chronic HTN (38.0-39.6)  ? Prior myomectomy (37.0-38.6) ? Concurrent findings (34.0-37.6) ? Cholestasis (37.0)  ? Umbilical vein varix (99991111) ? Growth Restriction (Twins) ? Diabetes  ? Placental abruption (chronic) ? Di-Di Isolated (36.0-37.6) ? Pregestational, controlled (39.0)  OBSTETRIC ISSUES ? Di-Di concurrent findings (32.0-34.6) ? Pregestational, uncontrolled (37.0-39.0)  ? Postdates ? (41 weeks) ? Mo-Di isolated (32.0-34.6)  ? Pregestational, vascular compromise (37.0- 39.0)  ? PPROM (34.0) ? Multiple Gestation ? Gestational, diet controlled (40.0)  ? Hx of IUFD (39.0 weeks) ? Di-Di (38.0-38.6) ? Gestational, med controlled (39.0)  ? Polyhydramnios, mild/moderate; SDV 8-16 or AFI 25-35 (39.0) ? Mo-Di (36.0-37.6) ? Gestational, uncontrolled (38.0-39.0)  ? Oligohydramnios (36.0-37.6); MVP <2 cm    Provider Signature: Hoyt Koch Scheduled [id number] Date:08/11/2021 11:09 AM   Call [phone] to finalize the induction date/time  [id number] (07/17)

## 2021-08-16 ENCOUNTER — Other Ambulatory Visit: Payer: Self-pay

## 2021-08-16 ENCOUNTER — Ambulatory Visit (INDEPENDENT_AMBULATORY_CARE_PROVIDER_SITE_OTHER): Payer: Managed Care, Other (non HMO) | Admitting: Obstetrics & Gynecology

## 2021-08-16 VITALS — BP 120/80 | Wt 204.0 lb

## 2021-08-16 DIAGNOSIS — Z3483 Encounter for supervision of other normal pregnancy, third trimester: Secondary | ICD-10-CM

## 2021-08-16 DIAGNOSIS — Z3A39 39 weeks gestation of pregnancy: Secondary | ICD-10-CM

## 2021-08-16 NOTE — Patient Instructions (Signed)
PRE ADMISSION TESTING For Covid, prior to procedure Tuesday 9:00-10:00 [facility] entrance (drive up)  Results in 89-21 hours You will not receive notification if test results are negative. If positive for Covid19, your provider will notify you by phone, with additional instructions.  Labor Induction Labor induction is when steps are taken to cause a pregnant woman to begin the labor process. Most women go into labor on their own between 37 weeks and 42 weeks of pregnancy. When this does not happen, or when there is a medical need for labor to begin, steps may be taken to induce, or bring on, labor. Labor induction causes a pregnant woman's uterus to contract. It also causes the cervix to soften (ripen), open (dilate), and thin out. Usually, labor is not induced before 39 weeks of pregnancy unless there is a medical reason to do so. When is labor induction considered? Labor induction may be right for you if: Your pregnancy lasts longer than 41 to 42 weeks. Your placenta is separating from your uterus (placental abruption). You have a rupture of membranes and your labor does not begin. You have health problems, like diabetes or high blood pressure (preeclampsia) during your pregnancy. Your baby has stopped growing or does not have enough amniotic fluid. Before labor induction begins, your health care provider will consider the following factors: Your medical condition and the baby's condition. How many weeks you have been pregnant. How mature the baby's lungs are. The condition of your cervix. The position of the baby. The size of your birth canal. Tell a health care provider about: Any allergies you have. All medicines you are taking, including vitamins, herbs, eye drops, creams, and over-the-counter medicines. Any problems you or your family members have had with anesthetic medicines. Any surgeries you have had. Any blood disorders you have. Any medical conditions you  have. What are the risks? Generally, this is a safe procedure. However, problems may occur, including: Failed induction. Changes in fetal heart rate, such as being too high, too low, or irregular (erratic). Infection in the mother or the baby. Increased risk of having a cesarean delivery. Breaking off (abruption) of the placenta from the uterus. This is rare. Rupture of the uterus. This is very rare. Your baby could fail to get enough blood flow or oxygen. This can be life-threatening. When induction is needed for medical reasons, the benefits generally outweigh the risks. What happens during the procedure? During the procedure, your health care provider will use one of these methods to induce labor: Stripping the membranes. In this method, the amniotic sac tissue is gently separated from the cervix. This causes the following to happen: Your cervix stretches, which in turn causes the release of prostaglandins. Prostaglandins induce labor and cause the uterus to contract. This procedure is often done in an office visit. You will be sent home to wait for contractions to begin. Prostaglandin medicine. This medicine starts contractions and causes the cervix to dilate and ripen. This can be taken by mouth (orally) or by being inserted into the vagina (suppository). Inserting a small, thin tube (catheter) with a balloon into the vagina and then expanding the balloon with water to dilate the cervix. Breaking the water. In this method, a small instrument is used to make a small hole in the amniotic sac. This eventually causes the amniotic sac to break. Contractions should begin within a few hours. Medicine to trigger or strengthen contractions. This medicine is given through an IV that is inserted into a  vein in your arm. This procedure may vary among health care providers and hospitals. Where to find more information March of Dimes: www.marchofdimes.org [employer] of Obstetricians and  Gynecologists: www.acog.org Summary Labor induction causes a pregnant woman's uterus to contract. It also causes the cervix to soften (ripen), open (dilate), and thin out. Labor is usually not induced before 39 weeks of pregnancy unless there is a medical reason to do so. When induction is needed for medical reasons, the benefits generally outweigh the risks. Talk with your health care provider about which methods of labor induction are right for you. This information is not intended to replace advice given to you by your health care provider. Make sure you discuss any questions you have with your health care provider. Document Revised: 04/23/2020 Document Reviewed: 04/23/2020 Elsevier Patient Education  2022 [employer].

## 2021-08-16 NOTE — H&P (View-Only) (Signed)
History and Physical  Jennifer Nelson is a 26 y.o. G2P1001 [redacted]w[redacted]d  for Induction of Labor scheduled due to Postdates .   See labor record for pregnancy highlights.  No recent pain, bleeding, ruptured membranes, or other signs of progressing labor.  PMHx: She  has a past medical history of GERD (gastroesophageal reflux disease), Headache, Pregnancy, and Vaginal Pap smear, abnormal. Also,  has a past surgical history that includes Dental surgery., family history includes Asthma in her brother; Diabetes in her maternal grandmother; Glaucoma in her father; Berenice Primas' disease in her mother; Hypertension in her father.,  reports that she has never smoked. She has never used smokeless tobacco. She reports that she does not currently use alcohol. She reports that she does not use drugs. She has a current medication list which includes the following prescription(s): butalbital-apap-caffeine and prenatal multivitamin. Also, is allergic to nexium [esomeprazole magnesium]. OB History  Gravida Para Term Preterm AB Living  2 1 1  0 0 1  SAB IAB Ectopic Multiple Live Births  0 0 0   1    # Outcome Date GA Lbr Len/2nd Weight Sex Delivery Anes PTL Lv  2 Current           1 Term 09/10/18 [redacted]w[redacted]d  8 lb 8 oz (3.856 kg) M Vag-Spont EPI  LIV  Patient denies any other pertinent gynecologic issues.   Review of Systems  Constitutional:  Negative for chills, fever and malaise/fatigue.  HENT:  Negative for congestion, sinus pain and sore throat.   Eyes:  Negative for blurred vision and pain.  Respiratory:  Negative for cough and wheezing.   Cardiovascular:  Negative for chest pain and leg swelling.  Gastrointestinal:  Negative for abdominal pain, constipation, diarrhea, heartburn, nausea and vomiting.  Genitourinary:  Negative for dysuria, frequency, hematuria and urgency.  Musculoskeletal:  Negative for back pain, joint pain, myalgias and neck pain.  Skin:  Negative for itching and rash.  Neurological:   Negative for dizziness, tremors and weakness.  Endo/Heme/Allergies:  Does not bruise/bleed easily.  Psychiatric/Behavioral:  Negative for depression. The patient is not nervous/anxious and does not have insomnia.    Objective: BP 120/80    Wt 204 lb (92.5 kg)    LMP 11/10/2020 (Exact Date)    BMI 31.95 kg/m  Physical Exam Constitutional:      General: She is not in acute distress.    Appearance: She is well-developed.  Genitourinary:     Right Labia: No rash or tenderness.    Left Labia: No tenderness or rash.    No vaginal erythema or bleeding.      Right Adnexa: not tender and no mass present.    Left Adnexa: not tender and no mass present.    No cervical motion tenderness, discharge, polyp or nabothian cyst.     Cervical exam comments: FT/40/-3.     Uterus is not enlarged.     No uterine mass detected.    Pelvic exam was performed with patient in the lithotomy position.  HENT:     Head: Normocephalic and atraumatic.     Nose: Nose normal.  Abdominal:     General: There is no distension.     Palpations: Abdomen is soft.     Tenderness: There is no abdominal tenderness.     Comments: FHT 140s  Musculoskeletal:        General: Normal range of motion.  Neurological:     Mental Status: She is alert and oriented to person,  place, and time.     Cranial Nerves: No cranial nerve deficit.  Skin:    General: Skin is warm and dry.  Psychiatric:        Attention and Perception: Attention normal.        Mood and Affect: Mood and affect normal.        Speech: Speech normal.        Behavior: Behavior normal.        Thought Content: Thought content normal.        Judgment: Judgment normal.    Assessment: Term Pregnancy for Induction of Labor due to Postdates.  Plan: Patient will undergo induction of labor with cervical ripening agents.     Patient has been fully informed of the pros and cons, risks and benefits of continued observation with fetal monitoring versus that of  induction of labor.   She understands that there are uncommon risks to induction, which include but are not limited to : frequent or prolonged uterine contractions, fetal distress, uterine rupture, and lack of successful induction.  These risks include all methods including Pitocin and Misoprostol and Cervadil.  Patient understands that using Misoprostol for labor induction is an "off label" indication although it has been studied extensively for this purpose and is an accepted method of induction.  She also has been informed of the increased risks for Cesarean with induction and should induction not be successful.  Patient consents to the induction plan of management.  Plans to bottle feed Plans IUD for contraception TDaP UTD  Barnett Applebaum, MD, [facility], [facility] 08/16/2021  9:39 AM

## 2021-08-16 NOTE — Progress Notes (Signed)
History and Physical  Jennifer Nelson is a 26 y.o. G2P1001 [redacted]w[redacted]d  for Induction of Labor scheduled due to Postdates .   See labor record for pregnancy highlights.  No recent pain, bleeding, ruptured membranes, or other signs of progressing labor.  PMHx: She  has a past medical history of GERD (gastroesophageal reflux disease), Headache, Pregnancy, and Vaginal Pap smear, abnormal. Also,  has a past surgical history that includes Dental surgery., family history includes Asthma in her brother; Diabetes in her maternal grandmother; Glaucoma in her father; Berenice Primas' disease in her mother; Hypertension in her father.,  reports that she has never smoked. She has never used smokeless tobacco. She reports that she does not currently use alcohol. She reports that she does not use drugs. She has a current medication list which includes the following prescription(s): butalbital-apap-caffeine and prenatal multivitamin. Also, is allergic to nexium [esomeprazole magnesium]. OB History  Gravida Para Term Preterm AB Living  2 1 1  0 0 1  SAB IAB Ectopic Multiple Live Births  0 0 0   1    # Outcome Date GA Lbr Len/2nd Weight Sex Delivery Anes PTL Lv  2 Current           1 Term 09/10/18 [redacted]w[redacted]d  8 lb 8 oz (3.856 kg) M Vag-Spont EPI  LIV  Patient denies any other pertinent gynecologic issues.   Review of Systems  Constitutional:  Negative for chills, fever and malaise/fatigue.  HENT:  Negative for congestion, sinus pain and sore throat.   Eyes:  Negative for blurred vision and pain.  Respiratory:  Negative for cough and wheezing.   Cardiovascular:  Negative for chest pain and leg swelling.  Gastrointestinal:  Negative for abdominal pain, constipation, diarrhea, heartburn, nausea and vomiting.  Genitourinary:  Negative for dysuria, frequency, hematuria and urgency.  Musculoskeletal:  Negative for back pain, joint pain, myalgias and neck pain.  Skin:  Negative for itching and rash.  Neurological:   Negative for dizziness, tremors and weakness.  Endo/Heme/Allergies:  Does not bruise/bleed easily.  Psychiatric/Behavioral:  Negative for depression. The patient is not nervous/anxious and does not have insomnia.    Objective: BP 120/80    Wt 204 lb (92.5 kg)    LMP 11/10/2020 (Exact Date)    BMI 31.95 kg/m  Physical Exam Constitutional:      General: She is not in acute distress.    Appearance: She is well-developed.  Genitourinary:     Right Labia: No rash or tenderness.    Left Labia: No tenderness or rash.    No vaginal erythema or bleeding.      Right Adnexa: not tender and no mass present.    Left Adnexa: not tender and no mass present.    No cervical motion tenderness, discharge, polyp or nabothian cyst.     Cervical exam comments: FT/40/-3.     Uterus is not enlarged.     No uterine mass detected.    Pelvic exam was performed with patient in the lithotomy position.  HENT:     Head: Normocephalic and atraumatic.     Nose: Nose normal.  Abdominal:     General: There is no distension.     Palpations: Abdomen is soft.     Tenderness: There is no abdominal tenderness.     Comments: FHT 140s  Musculoskeletal:        General: Normal range of motion.  Neurological:     Mental Status: She is alert and oriented to person,  place, and time.     Cranial Nerves: No cranial nerve deficit.  Skin:    General: Skin is warm and dry.  Psychiatric:        Attention and Perception: Attention normal.        Mood and Affect: Mood and affect normal.        Speech: Speech normal.        Behavior: Behavior normal.        Thought Content: Thought content normal.        Judgment: Judgment normal.    Assessment: Term Pregnancy for Induction of Labor due to Postdates.  Plan: Patient will undergo induction of labor with cervical ripening agents.     Patient has been fully informed of the pros and cons, risks and benefits of continued observation with fetal monitoring versus that of  induction of labor.   She understands that there are uncommon risks to induction, which include but are not limited to : frequent or prolonged uterine contractions, fetal distress, uterine rupture, and lack of successful induction.  These risks include all methods including Pitocin and Misoprostol and Cervadil.  Patient understands that using Misoprostol for labor induction is an "off label" indication although it has been studied extensively for this purpose and is an accepted method of induction.  She also has been informed of the increased risks for Cesarean with induction and should induction not be successful.  Patient consents to the induction plan of management.  Plans to bottle feed Plans IUD for contraception TDaP UTD  Barnett Applebaum, MD, [facility], [facility] 08/16/2021  9:39 AM

## 2021-08-17 ENCOUNTER — Other Ambulatory Visit
Admission: RE | Admit: 2021-08-17 | Discharge: 2021-08-17 | Disposition: A | Discharge status: Home / Self Care | Payer: Managed Care, Other (non HMO) | Source: Ambulatory Visit | Attending: Obstetrics & Gynecology | Admitting: Obstetrics & Gynecology

## 2021-08-17 DIAGNOSIS — Z3483 Encounter for supervision of other normal pregnancy, third trimester: Secondary | ICD-10-CM

## 2021-08-17 DIAGNOSIS — Z20822 Contact with and (suspected) exposure to covid-19: Secondary | ICD-10-CM | POA: Insufficient documentation

## 2021-08-17 DIAGNOSIS — Z01812 Encounter for preprocedural laboratory examination: Secondary | ICD-10-CM | POA: Insufficient documentation

## 2021-08-17 DIAGNOSIS — Z3A39 39 weeks gestation of pregnancy: Secondary | ICD-10-CM | POA: Insufficient documentation

## 2021-08-18 LAB — SARS CORONAVIRUS 2 (TAT 6-24 HRS): SARS Coronavirus 2: NEGATIVE

## 2021-08-19 ENCOUNTER — Inpatient Hospital Stay: Payer: Managed Care, Other (non HMO) | Admitting: Anesthesiology

## 2021-08-19 ENCOUNTER — Encounter: Payer: Managed Care, Other (non HMO) | Admitting: Obstetrics & Gynecology

## 2021-08-19 ENCOUNTER — Encounter: Payer: Self-pay | Admitting: Obstetrics & Gynecology

## 2021-08-19 ENCOUNTER — Inpatient Hospital Stay
Admission: EM | Admit: 2021-08-19 | Discharge: 2021-08-21 | DRG: 807 | Disposition: A | Discharge status: Home / Self Care | Payer: Managed Care, Other (non HMO) | Attending: Obstetrics & Gynecology | Admitting: Obstetrics & Gynecology

## 2021-08-19 ENCOUNTER — Other Ambulatory Visit: Payer: Self-pay

## 2021-08-19 DIAGNOSIS — O9882 Other maternal infectious and parasitic diseases complicating childbirth: Secondary | ICD-10-CM | POA: Diagnosis not present

## 2021-08-19 DIAGNOSIS — O99824 Streptococcus B carrier state complicating childbirth: Secondary | ICD-10-CM | POA: Diagnosis present

## 2021-08-19 DIAGNOSIS — K219 Gastro-esophageal reflux disease without esophagitis: Secondary | ICD-10-CM | POA: Diagnosis not present

## 2021-08-19 DIAGNOSIS — Z20822 Contact with and (suspected) exposure to covid-19: Secondary | ICD-10-CM | POA: Diagnosis present

## 2021-08-19 DIAGNOSIS — Z3480 Encounter for supervision of other normal pregnancy, unspecified trimester: Secondary | ICD-10-CM

## 2021-08-19 DIAGNOSIS — O09293 Supervision of pregnancy with other poor reproductive or obstetric history, third trimester: Secondary | ICD-10-CM | POA: Diagnosis not present

## 2021-08-19 DIAGNOSIS — Z9889 Other specified postprocedural states: Secondary | ICD-10-CM

## 2021-08-19 DIAGNOSIS — O99891 Other specified diseases and conditions complicating pregnancy: Secondary | ICD-10-CM | POA: Diagnosis not present

## 2021-08-19 DIAGNOSIS — Z3A40 40 weeks gestation of pregnancy: Secondary | ICD-10-CM

## 2021-08-19 DIAGNOSIS — O480 Post-term pregnancy: Secondary | ICD-10-CM | POA: Diagnosis present

## 2021-08-19 DIAGNOSIS — O3443 Maternal care for other abnormalities of cervix, third trimester: Secondary | ICD-10-CM

## 2021-08-19 DIAGNOSIS — B951 Streptococcus, group B, as the cause of diseases classified elsewhere: Secondary | ICD-10-CM | POA: Diagnosis not present

## 2021-08-19 LAB — CBC
HCT: 27.6 % — ABNORMAL LOW (ref 36.0–46.0)
Hemoglobin: 9.3 g/dL — ABNORMAL LOW (ref 12.0–15.0)
MCH: 29.1 pg (ref 26.0–34.0)
MCHC: 33.7 g/dL (ref 30.0–36.0)
MCV: 86.3 fL (ref 80.0–100.0)
Platelets: 260 10*3/uL (ref 150–400)
RBC: 3.2 MIL/uL — ABNORMAL LOW (ref 3.87–5.11)
RDW: 12.6 % (ref 11.5–15.5)
WBC: 9 10*3/uL (ref 4.0–10.5)
nRBC: 0 % (ref 0.0–0.2)

## 2021-08-19 LAB — TYPE AND SCREEN
ABO/RH(D): O POS
Antibody Screen: NEGATIVE

## 2021-08-19 MED ORDER — OXYTOCIN-SODIUM CHLORIDE 30-0.9 UT/500ML-% IV SOLN
2.5000 [IU]/h | INTRAVENOUS | Status: DC
  Administered 2021-08-20: 2.5 [IU]/h via INTRAVENOUS
  Filled 2021-08-19: qty 1000

## 2021-08-19 MED ORDER — DIPHENHYDRAMINE HCL 50 MG/ML IJ SOLN
12.5000 mg | INTRAMUSCULAR | Status: DC | PRN
  Administered 2021-08-19: 12.5 mg via INTRAVENOUS
  Filled 2021-08-19: qty 1

## 2021-08-19 MED ORDER — FENTANYL-BUPIVACAINE-NACL 0.5-0.125-0.9 MG/250ML-% EP SOLN
12.0000 mL/h | EPIDURAL | Status: DC | PRN
  Administered 2021-08-19: 12 mL/h via EPIDURAL

## 2021-08-19 MED ORDER — ONDANSETRON HCL 4 MG/2ML IJ SOLN
4.0000 mg | Freq: Four times a day (QID) | INTRAMUSCULAR | Status: DC | PRN
Start: 2021-08-19 — End: 2021-08-20

## 2021-08-19 MED ORDER — SODIUM CHLORIDE 0.9 % IV SOLN
5.0000 10*6.[IU] | Freq: Once | INTRAVENOUS | Status: AC
  Administered 2021-08-19: 5 10*6.[IU] via INTRAVENOUS
  Filled 2021-08-19: qty 5

## 2021-08-19 MED ORDER — TERBUTALINE SULFATE 1 MG/ML IJ SOLN: 0.2500 mg | Freq: Once | INTRAMUSCULAR | Status: DC | PRN

## 2021-08-19 MED ORDER — BUTORPHANOL TARTRATE 1 MG/ML IJ SOLN: 1.0000 mg | INTRAMUSCULAR | Status: DC | PRN

## 2021-08-19 MED ORDER — PHENYLEPHRINE 40 MCG/ML (10ML) SYRINGE FOR IV PUSH (FOR BLOOD PRESSURE SUPPORT): 80.0000 ug | PREFILLED_SYRINGE | INTRAVENOUS | Status: DC | PRN

## 2021-08-19 MED ORDER — LACTATED RINGERS IV SOLN
500.0000 mL | Freq: Once | INTRAVENOUS | Status: AC
  Administered 2021-08-19: 500 mL via INTRAVENOUS

## 2021-08-19 MED ORDER — OXYTOCIN BOLUS FROM INFUSION
333.0000 mL | Freq: Once | INTRAVENOUS | Status: AC
  Administered 2021-08-20: 333 mL via INTRAVENOUS

## 2021-08-19 MED ORDER — LIDOCAINE HCL (PF) 1 % IJ SOLN
INTRAMUSCULAR | Status: DC | PRN
  Administered 2021-08-19: 1 mL via SUBCUTANEOUS

## 2021-08-19 MED ORDER — OXYTOCIN 10 UNIT/ML IJ SOLN
INTRAMUSCULAR | Status: AC
  Filled 2021-08-19: qty 2

## 2021-08-19 MED ORDER — LIDOCAINE HCL (PF) 1 % IJ SOLN
INTRAMUSCULAR | Status: AC
  Filled 2021-08-19: qty 30

## 2021-08-19 MED ORDER — LIDOCAINE-EPINEPHRINE (PF) 1.5 %-1:200000 IJ SOLN
INTRAMUSCULAR | Status: DC | PRN
  Administered 2021-08-19: 3 mL via EPIDURAL

## 2021-08-19 MED ORDER — ACETAMINOPHEN 325 MG PO TABS: 650.0000 mg | ORAL_TABLET | ORAL | Status: DC | PRN

## 2021-08-19 MED ORDER — SODIUM CHLORIDE 0.9 % IV SOLN
INTRAVENOUS | Status: DC | PRN
  Administered 2021-08-19 (×2): 5 mL via EPIDURAL

## 2021-08-19 MED ORDER — MISOPROSTOL 25 MCG QUARTER TABLET
25.0000 ug | ORAL_TABLET | ORAL | Status: DC | PRN
  Administered 2021-08-19: 25 ug via VAGINAL
  Administered 2021-08-20: 800 ug via VAGINAL
  Filled 2021-08-19: qty 1

## 2021-08-19 MED ORDER — MISOPROSTOL 200 MCG PO TABS
ORAL_TABLET | ORAL | Status: AC
  Filled 2021-08-19: qty 4

## 2021-08-19 MED ORDER — FENTANYL-BUPIVACAINE-NACL 0.5-0.125-0.9 MG/250ML-% EP SOLN
EPIDURAL | Status: AC
  Filled 2021-08-19: qty 250

## 2021-08-19 MED ORDER — PENICILLIN G POT IN DEXTROSE 60000 UNIT/ML IV SOLN
3.0000 10*6.[IU] | INTRAVENOUS | Status: DC
  Administered 2021-08-19 – 2021-08-20 (×4): 3 10*6.[IU] via INTRAVENOUS
  Filled 2021-08-19 (×4): qty 50

## 2021-08-19 MED ORDER — EPHEDRINE 5 MG/ML INJ: 10.0000 mg | INTRAVENOUS | Status: DC | PRN

## 2021-08-19 MED ORDER — AMMONIA AROMATIC IN INHA
RESPIRATORY_TRACT | Status: AC
  Filled 2021-08-19: qty 10

## 2021-08-19 MED ORDER — LACTATED RINGERS IV SOLN: INTRAVENOUS | Status: DC

## 2021-08-19 MED ORDER — LACTATED RINGERS IV SOLN: 500.0000 mL | INTRAVENOUS | Status: DC | PRN

## 2021-08-19 MED ORDER — LIDOCAINE HCL (PF) 1 % IJ SOLN
30.0000 mL | INTRAMUSCULAR | Status: AC | PRN
  Administered 2021-08-20: 30 mL via SUBCUTANEOUS

## 2021-08-19 MED ORDER — OXYTOCIN-SODIUM CHLORIDE 30-0.9 UT/500ML-% IV SOLN
1.0000 m[IU]/min | INTRAVENOUS | Status: DC
  Administered 2021-08-19: 2 m[IU]/min via INTRAVENOUS

## 2021-08-19 NOTE — Interval H&P Note (Signed)
History and Physical Interval Note:  08/19/2021 11:04 AM  Jennifer Nelson  has presented today for surgery, with the diagnosis of term, even postdates, pregnancy.  The various methods of treatment have been discussed with the patient and family. After consideration of risks, benefits and other options for treatment, the patient has consented to induction of labor.  The patient's history has been reviewed, patient examined, no change in status, stable for IOL.  I have reviewed the patient's chart and labs.  Questions were answered to the patient's satisfaction.     Letitia Libra

## 2021-08-19 NOTE — Anesthesia Procedure Notes (Signed)
Epidural Patient location during procedure: OB Start time: 08/19/2021 6:49 PM End time: 08/19/2021 6:53 PM  Staffing Anesthesiologist: Dyllan Kats, Precious Haws, MD Performed: anesthesiologist   Preanesthetic Checklist Completed: patient identified, IV checked, site marked, risks and benefits discussed, surgical consent, monitors and equipment checked, pre-op evaluation and timeout performed  Epidural Patient position: sitting Prep: ChloraPrep Patient monitoring: heart rate, continuous pulse ox and blood pressure Approach: midline Location: L3-L4 Injection technique: LOR saline  Needle:  Needle type: Tuohy  Needle gauge: 17 G Needle length: 9 cm and 9 Needle insertion depth: 6.5 cm Catheter type: closed end flexible Catheter size: 19 Gauge Catheter at skin depth: 10.5 cm Test dose: negative and 1.5% lidocaine with Epi 1:200 K  Assessment Sensory level: T10 Events: blood not aspirated, injection not painful, no injection resistance, no paresthesia and negative IV test  Additional Notes 1 attempt Pt. Evaluated and documentation done after procedure finished. Patient identified. Risks/Benefits/Options discussed with patient including but not limited to bleeding, infection, nerve damage, paralysis, failed block, incomplete pain control, headache, blood pressure changes, nausea, vomiting, reactions to medication both or allergic, itching and postpartum back pain. Confirmed with bedside nurse the patient's most recent platelet count. Confirmed with patient that they are not currently taking any anticoagulation, have any bleeding history or any family history of bleeding disorders. Patient expressed understanding and wished to proceed. All questions were answered. Sterile technique was used throughout the entire procedure. Please see nursing notes for vital signs. Test dose was given through epidural catheter and negative prior to continuing to dose epidural or start infusion. Warning signs of  high block given to the patient including shortness of breath, tingling/numbness in hands, complete motor block, or any concerning symptoms with instructions to call for help. Patient was given instructions on fall risk and not to get out of bed. All questions and concerns addressed with instructions to call with any issues or inadequate analgesia.   Patient tolerated the insertion well without immediate complications.Reason for block:procedure for pain

## 2021-08-19 NOTE — Progress Notes (Signed)
°  Labor Progress Note   26 y.o. G2P1001 @ [redacted]w[redacted]d , admitted for  Pregnancy, Labor Management. Induction of labor  Subjective:  Contractions are beginning to feel more uncomfortable.  Objective:  BP 123/70 (BP Location: Left Arm)    Pulse 82    Temp 98.4 F (36.9 C) (Oral)    Resp 16    Ht 5\' 6"  (1.676 m)    Wt 92.5 kg    LMP 11/10/2020 (Exact Date)    BMI 32.93 kg/m  Abd: gravid, ND, FHT present, mild tenderness on exam Extr: no edema SVE: CERVIX: 1.5 cm dilated, 80 effaced, -2 station Cervical sweep, Foley bulb placed  EFM: FHR: 135 bpm, variability: moderate,  accelerations:  Present,  decelerations:  Absent Toco: Frequency: Every 2-3 minutes Labs: I have reviewed the patient's lab results.   Assessment & Plan:  G2P1001 @ [redacted]w[redacted]d, admitted for  Pregnancy and Labor/Delivery Management  1. Pain management:  position changes . 2. FWB: FHT category I.  3. ID: GBS positive s/p loading dose, next dose due at 2:15 PM 4. Labor management: s/p foley bulb/sweep, continues to have regular contractions, start pitocin as needed  All discussed with patient, see orders   [redacted]w[redacted]d, CNM [facility], [facility] 08/19/2021  2:04 PM

## 2021-08-19 NOTE — H&P (Signed)
OB History & Physical   History of Present Illness:  Chief Complaint: induction of labor  HPI:  Jennifer Nelson is a 26 y.o. G2P1001 female at [redacted]w[redacted]d dated by LMP.  Her pregnancy has been complicated by GERD, hx of LEEP following last delivery .    She denies contractions.   She denies leakage of fluid.   She denies vaginal bleeding.   She reports fetal movement.    Total weight gain for pregnancy: 13.6 kg   Obstetrical Problem List: June 2022 Problems (from 01/08/21 to present)     Problem Noted Resolved   Supervision of other normal pregnancy, antepartum 01/28/2021 by Conard Novak, MD No   Overview Addendum 06/10/2021 10:17 AM by Nadara Mustard, MD     Nursing Staff Provider  Office Location  [facility] Dating  LMP, [place] confirmed  Language  English Anatomy US  nml  Flu Vaccine  04/28/21 Genetic Screen  NIPS: XX  TDaP vaccine   06/10/2021 Hgb A1C or  GTT Third trimester : 119  Covid    LAB RESULTS   Rhogam  n/a Blood Type O/Positive/-- (07/07 1010)   Feeding Plan breast Antibody Negative (07/07 1010)  Contraception Paraguard, also planning vasec Rubella 3.33 (07/07 1010)  Circumcision n/a RPR Non Reactive (11/02 1027)   Pediatrician   HBsAg Negative (07/07 1010)   Support Person husband HIV Non Reactive (11/02 1027)  Prenatal Classes no Varicella Immune    GBS  (For PCN allergy, check sensitivities)   BTL Consent no    VBAC Consent n/a Pap  UTD           History of LEEP (loop electrosurgical excision procedure) of cervix complicating pregnancy 01/28/2021 by Conard Novak, MD No   Overview Signed 01/28/2021  9:24 AM by Conard Novak, MD    Cervical length checks starting at 6 weeks           Maternal Medical History:   Past Medical History:  Diagnosis Date   GERD (gastroesophageal reflux disease)    Headache    Pregnancy    Vaginal Pap smear, abnormal     Past Surgical History:  Procedure Laterality Date   DENTAL SURGERY      Allergies   Allergen Reactions   Nexium [Esomeprazole Magnesium] Nausea And Vomiting    Prior to Admission medications   Medication Sig Start Date End Date Taking? Authorizing Provider  Prenatal Vit-Fe Fumarate-FA (PRENATAL MULTIVITAMIN) TABS tablet Take 1 tablet by mouth daily at 12 noon.   Yes [provider]  Butalbital-APAP-Caffeine [phone] MG capsule Take 1-2 capsules by mouth every 6 (six) hours as needed for headache. 01/21/21   Nadara Mustard, MD    OB History  Gravida Para Term Preterm AB Living  2 1 1  0 0 1  SAB IAB Ectopic Multiple Live Births  0 0 0   1    # Outcome Date GA Lbr Len/2nd Weight Sex Delivery Anes PTL Lv  2 Current           1 Term 09/10/18 [redacted]w[redacted]d  3856 g M Vag-Spont EPI  LIV    Prenatal care site: [facility]  Social History: She  reports that she has never smoked. She has never used smokeless tobacco. She reports that she does not currently use alcohol. She reports that she does not use drugs.  Family History: family history includes Asthma in her brother; Diabetes in her maternal grandmother; Glaucoma in her father; Luiz Blare' disease  in her mother; Hypertension in her father.    Review of Systems:  Review of Systems  Constitutional:  Negative for chills and fever.  HENT:  Negative for congestion, ear discharge, ear pain, hearing loss, sinus pain and sore throat.   Eyes:  Negative for blurred vision and double vision.  Respiratory:  Negative for cough, shortness of breath and wheezing.   Cardiovascular:  Negative for chest pain, palpitations and leg swelling.  Gastrointestinal:  Negative for abdominal pain, blood in stool, constipation, diarrhea, heartburn, melena, nausea and vomiting.  Genitourinary:  Negative for dysuria, flank pain, frequency, hematuria and urgency.  Musculoskeletal:  Negative for back pain, joint pain and myalgias.  Skin:  Negative for itching and rash.  Neurological:  Negative for dizziness, tingling, tremors, sensory change,  speech change, focal weakness, seizures, loss of consciousness, weakness and headaches.  Endo/Heme/Allergies:  Negative for environmental allergies. Does not bruise/bleed easily.  Psychiatric/Behavioral:  Negative for depression, hallucinations, memory loss, substance abuse and suicidal ideas. The patient is not nervous/anxious and does not have insomnia.     Physical Exam:  BP 120/79 (BP Location: Left Arm)    Pulse (!) 110    Temp 98.2 F (36.8 C) (Oral)    Resp 17    Ht 5\' 6"  (1.676 m)    Wt 92.5 kg    LMP 11/10/2020 (Exact Date)    BMI 32.93 kg/m   Constitutional: Well nourished, well developed female in no acute distress.  HEENT: normal Skin: Warm and dry.  Cardiovascular: Regular rate and rhythm.   Extremity:  no edema   Respiratory: Clear to auscultation bilateral. Normal respiratory effort Abdomen: FHT present Back: no CVAT Neuro: DTRs 2+, Cranial nerves grossly intact Psych: Alert and Oriented x3. No memory deficits. Normal mood and affect.    Pelvic exam: (female chaperone present) is not limited by body habitus EGBUS: within normal limits Vagina: within normal limits and with normal mucosa  Cervix: 0.5/50/-2   Baseline FHR: 140 beats/min   Variability: moderate   Accelerations: present   Decelerations: absent Contractions: absent Overall assessment: reassuring   Lab Results  Component Value Date   SARSCOV2NAA NEGATIVE 08/17/2021    Assessment:  Jennifer Nelson is a 26 y.o. G45P1001 female at [redacted]w[redacted]d with induction of labor.   Plan:  Admit to Labor & Delivery  CBC, T&S, Clrs, IVF GBS positive: penicillin prophylaxis   Fetal well-being: Category I Begin induction with cytotec    Rod Can, CNM 08/19/2021 9:28 AM

## 2021-08-19 NOTE — Anesthesia Preprocedure Evaluation (Signed)
Anesthesia Evaluation  Patient identified by MRN, date of birth, ID band Patient awake    Reviewed: Allergy & Precautions, NPO status , Patient's Chart, lab work & pertinent test results  History of Anesthesia Complications Negative for: history of anesthetic complications  Airway Mallampati: III  TM Distance: >3 FB Neck ROM: full    Dental  (+) Chipped   Pulmonary neg pulmonary ROS,    Pulmonary exam normal        Cardiovascular Exercise Tolerance: Good (-) hypertensionnegative cardio ROS Normal cardiovascular exam     Neuro/Psych  Headaches,  Neuromuscular disease    GI/Hepatic GERD  Controlled,  Endo/Other    Renal/GU   negative genitourinary   Musculoskeletal   Abdominal   Peds  Hematology negative hematology ROS (+)   Anesthesia Other Findings Past Medical History: No date: GERD (gastroesophageal reflux disease) No date: Headache No date: Pregnancy No date: Vaginal Pap smear, abnormal  Past Surgical History: No date: DENTAL SURGERY  BMI    Body Mass Index: 32.93 kg/m      Reproductive/Obstetrics (+) Pregnancy                             Anesthesia Physical Anesthesia Plan  ASA: 2  Anesthesia Plan: Epidural   Post-op Pain Management:    Induction:   PONV Risk Score and Plan:   Airway Management Planned: Natural Airway  Additional Equipment:   Intra-op Plan:   Post-operative Plan:   Informed Consent: I have reviewed the patients History and Physical, chart, labs and discussed the procedure including the risks, benefits and alternatives for the proposed anesthesia with the patient or authorized representative who has indicated his/her understanding and acceptance.     Dental Advisory Given  Plan Discussed with: Anesthesiologist  Anesthesia Plan Comments: (Patient reports no bleeding problems and no anticoagulant use.   Patient consented for risks of  anesthesia including but not limited to:  - adverse reactions to medications - risk of bleeding, infection and or nerve damage from epidural that could lead to paralysis - risk of headache or failed epidural - nerve damage due to positioning - that if epidural is used for C-section that there is a chance of epidural failure requiring spinal placement or conversion to GA - Damage to heart, brain, lungs, other parts of body or loss of life  Patient voiced understanding.)        Anesthesia Quick Evaluation

## 2021-08-19 NOTE — Progress Notes (Signed)
°  Labor Progress Note   26 y.o. G2P1001 @ [redacted]w[redacted]d , admitted for  Pregnancy, Labor Management.   Subjective:  No pain after epidural  Objective:  BP 128/78    Pulse 89    Temp 98.2 F (36.8 C) (Oral)    Resp 16    Ht 5\' 6"  (1.676 m)    Wt 92.5 kg    LMP 11/10/2020 (Exact Date)    SpO2 99%    BMI 32.93 kg/m  Abd: gravid, ND, FHT present, mild tenderness on exam Extr: trace to 1+ bilateral pedal edema SVE: 4/80/-1, feels like a s/p LEEP cervix w some scarring  EFM: FHR: 140 bpm, variability: moderate,  accelerations:  Present,  decelerations:  Absent Toco: Frequency: Every 3-4 minutes Labs: I have reviewed the patient's lab results.   Assessment & Plan:  G2P1001 @ [redacted]w[redacted]d, admitted for  Pregnancy and Labor/Delivery Management  1. Pain management: epidural. 2. FWB: FHT category  1.  3. ID: GBS positive 4. Labor management: AROM now clear Cont Pitocin  All discussed with patient, see orders  [redacted]w[redacted]d, MD, [facility], [facility] 08/19/2021  10:38 PM

## 2021-08-19 NOTE — Progress Notes (Signed)
°  Labor Progress Note   26 y.o. G2P1001 @ [redacted]w[redacted]d , admitted for  Pregnancy, Labor Management.   Subjective:  Mild pain  Objective:  BP 126/83    Pulse 81    Temp 98.4 F (36.9 C) (Oral)    Resp 18    Ht 5\' 6"  (1.676 m)    Wt 92.5 kg    LMP 11/10/2020 (Exact Date)    BMI 32.93 kg/m  Abd: gravid, ND, FHT present, mild tenderness on exam Extr: trace to 1+ bilateral pedal edema SVE: 2/80/-3   Vtx  EFM: FHR: 140 bpm, variability: moderate,  accelerations:  Present,  decelerations:  Absent Toco: Frequency: Every 2-3 minutes Labs: I have reviewed the patient's lab results.   Assessment & Plan:  G2P1001 @ [redacted]w[redacted]d, admitted for  Pregnancy and Labor/Delivery Management  1. Pain management: none. 2. FWB: FHT category 1.  3. ID: GBS positive 4. Labor management: Pitocin as next option.  AROM when better station  All discussed with patient, see orders  [redacted]w[redacted]d, MD, [facility], [facility] 08/19/2021  4:53 PM

## 2021-08-20 ENCOUNTER — Encounter: Payer: Self-pay | Admitting: Obstetrics & Gynecology

## 2021-08-20 DIAGNOSIS — O99891 Other specified diseases and conditions complicating pregnancy: Secondary | ICD-10-CM

## 2021-08-20 DIAGNOSIS — O09293 Supervision of pregnancy with other poor reproductive or obstetric history, third trimester: Secondary | ICD-10-CM

## 2021-08-20 DIAGNOSIS — O9882 Other maternal infectious and parasitic diseases complicating childbirth: Secondary | ICD-10-CM

## 2021-08-20 DIAGNOSIS — K219 Gastro-esophageal reflux disease without esophagitis: Secondary | ICD-10-CM

## 2021-08-20 DIAGNOSIS — Z3A40 40 weeks gestation of pregnancy: Secondary | ICD-10-CM

## 2021-08-20 DIAGNOSIS — O480 Post-term pregnancy: Principal | ICD-10-CM

## 2021-08-20 DIAGNOSIS — B951 Streptococcus, group B, as the cause of diseases classified elsewhere: Secondary | ICD-10-CM

## 2021-08-20 LAB — CBC
HCT: 28.9 % — ABNORMAL LOW (ref 36.0–46.0)
Hemoglobin: 9.5 g/dL — ABNORMAL LOW (ref 12.0–15.0)
MCH: 28.6 pg (ref 26.0–34.0)
MCHC: 32.9 g/dL (ref 30.0–36.0)
MCV: 87 fL (ref 80.0–100.0)
Platelets: 259 10*3/uL (ref 150–400)
RBC: 3.32 MIL/uL — ABNORMAL LOW (ref 3.87–5.11)
RDW: 12.5 % (ref 11.5–15.5)
WBC: 15.1 10*3/uL — ABNORMAL HIGH (ref 4.0–10.5)
nRBC: 0 % (ref 0.0–0.2)

## 2021-08-20 LAB — RPR: RPR Ser Ql: NONREACTIVE

## 2021-08-20 MED ORDER — ACETAMINOPHEN 325 MG PO TABS
650.0000 mg | ORAL_TABLET | ORAL | Status: DC | PRN
  Administered 2021-08-20 (×2): 650 mg via ORAL
  Filled 2021-08-20 (×2): qty 2

## 2021-08-20 MED ORDER — ONDANSETRON HCL 4 MG/2ML IJ SOLN
4.0000 mg | INTRAMUSCULAR | Status: DC | PRN
  Administered 2021-08-20: 4 mg via INTRAVENOUS
  Filled 2021-08-20: qty 2

## 2021-08-20 MED ORDER — SENNOSIDES-DOCUSATE SODIUM 8.6-50 MG PO TABS
2.0000 | ORAL_TABLET | ORAL | Status: DC
  Administered 2021-08-20 – 2021-08-21 (×2): 2 via ORAL
  Filled 2021-08-20 (×2): qty 2

## 2021-08-20 MED ORDER — BENZOCAINE-MENTHOL 20-0.5 % EX AERO
1.0000 "application " | INHALATION_SPRAY | CUTANEOUS | Status: DC | PRN
  Administered 2021-08-20: 1 via TOPICAL
  Filled 2021-08-20: qty 56

## 2021-08-20 MED ORDER — WITCH HAZEL-GLYCERIN EX PADS
1.0000 "application " | MEDICATED_PAD | CUTANEOUS | Status: DC | PRN
  Administered 2021-08-20: 1 via TOPICAL
  Filled 2021-08-20: qty 100

## 2021-08-20 MED ORDER — COCONUT OIL OIL
1.0000 "application " | TOPICAL_OIL | Status: DC | PRN
  Filled 2021-08-20: qty 120

## 2021-08-20 MED ORDER — SIMETHICONE 80 MG PO CHEW: 80.0000 mg | CHEWABLE_TABLET | ORAL | Status: DC | PRN

## 2021-08-20 MED ORDER — ZOLPIDEM TARTRATE 5 MG PO TABS: 5.0000 mg | ORAL_TABLET | Freq: Every evening | ORAL | Status: DC | PRN

## 2021-08-20 MED ORDER — DIBUCAINE (PERIANAL) 1 % EX OINT: 1.0000 "application " | TOPICAL_OINTMENT | CUTANEOUS | Status: DC | PRN

## 2021-08-20 MED ORDER — ONDANSETRON HCL 4 MG PO TABS: 4.0000 mg | ORAL_TABLET | ORAL | Status: DC | PRN

## 2021-08-20 MED ORDER — SODIUM CHLORIDE 0.9% FLUSH: 3.0000 mL | INTRAVENOUS | Status: DC | PRN

## 2021-08-20 MED ORDER — IBUPROFEN 600 MG PO TABS
600.0000 mg | ORAL_TABLET | Freq: Four times a day (QID) | ORAL | Status: DC
  Administered 2021-08-20 – 2021-08-21 (×5): 600 mg via ORAL
  Filled 2021-08-20 (×5): qty 1

## 2021-08-20 MED ORDER — DIPHENHYDRAMINE HCL 25 MG PO CAPS: 25.0000 mg | ORAL_CAPSULE | Freq: Four times a day (QID) | ORAL | Status: DC | PRN

## 2021-08-20 MED ORDER — OXYCODONE-ACETAMINOPHEN 5-325 MG PO TABS: 1.0000 | ORAL_TABLET | ORAL | Status: DC | PRN

## 2021-08-20 MED ORDER — OXYCODONE-ACETAMINOPHEN 5-325 MG PO TABS: 2.0000 | ORAL_TABLET | ORAL | Status: DC | PRN

## 2021-08-20 MED ORDER — SODIUM CHLORIDE 0.9 % IV SOLN: 250.0000 mL | INTRAVENOUS | Status: DC | PRN

## 2021-08-20 MED ORDER — SODIUM CHLORIDE 0.9% FLUSH
3.0000 mL | Freq: Two times a day (BID) | INTRAVENOUS | Status: DC
  Administered 2021-08-20 – 2021-08-21 (×2): 3 mL via INTRAVENOUS

## 2021-08-20 MED ORDER — METHYLERGONOVINE MALEATE 0.2 MG PO TABS
0.2000 mg | ORAL_TABLET | Freq: Four times a day (QID) | ORAL | Status: AC
  Administered 2021-08-20 (×3): 0.2 mg via ORAL
  Filled 2021-08-20 (×3): qty 1

## 2021-08-20 NOTE — Progress Notes (Signed)
Admit Date: 08/19/2021 Today's Date: 08/20/2021  Post Partum Day 1/2  Subjective:  no complaints, up ad lib, voiding, and tolerating PO  Objective: Temp:  [98.2 F (36.8 C)-99.2 F (37.3 C)] 98.7 F (37.1 C) (01/27 1646) Pulse Rate:  [79-135] 80 (01/27 1646) Resp:  [16-20] 18 (01/27 1646) BP: (103-144)/(56-96) 124/82 (01/27 1646) SpO2:  [94 %-100 %] 98 % (01/27 1646)  Physical Exam:  General: alert, cooperative, and no distress Lochia: appropriate Uterine Fundus: firm Incision: none DVT Evaluation: No evidence of DVT seen on physical exam.  Recent Labs    08/19/21 0855 08/20/21 1521  HGB 9.3* 9.5*  HCT 27.6* 28.9*    Assessment/Plan: Breastfeeding and Infant doing well   LOS: 1 day   [facility] 08/20/2021, 5:25 PM

## 2021-08-20 NOTE — Discharge Summary (Signed)
Postpartum Discharge Summary  Date of Service updated1/28/23     Patient Name: Jennifer Nelson DOB: 1995/11/07 MRN: [id number]  Date of admission: 08/19/2021 Delivery date:08/20/2021  Delivering provider: Gae Dry  Date of discharge: 08/21/2021  Admitting diagnosis: Post-dates pregnancy [O48.0] Intrauterine pregnancy: [redacted]w[redacted]d    Secondary diagnosis:  Principal Problem:   Post-dates pregnancy Active Problems:   H/O LEEP  Additional problems: None    Discharge diagnosis: Term Pregnancy Delivered                                              Post partum procedures: none Augmentation: AROM, Pitocin, Cytotec, and IP Foley Complications: None  Hospital course: Induction of Labor With Vaginal Delivery   26y.o. yo G2P1001 at 437w3das admitted to the hospital 08/19/2021 for induction of labor.  Indication for induction: Postdates.  Patient had an uncomplicated labor course as follows: Membrane Rupture Time/Date: 10:35 PM ,08/19/2021   Delivery Method:Vaginal, Spontaneous  Episiotomy: None  Lacerations:  1st degree  Details of delivery can be found in separate delivery note.  Patient had a routine postpartum course. Patient is discharged home 08/21/21.  Newborn Data: Birth date:08/20/2021  Birth time:4:11 AM  Gender:Female  Living status:Living  Apgars:9 ,9  Weight:3960 g   Magnesium Sulfate received: No BMZ received: No Rhophylac:No MMR:No T-DaP:Given prenatally Flu: Yes Transfusion:No  Physical exam  Vitals:   08/20/21 1646 08/20/21 1950 08/21/21 0118 08/21/21 0759  BP: 124/82 124/88 107/74 121/85  Pulse: 80 79 74 69  Resp: _0 Temp: 98.7 F (37.1 C) 97.8 F (36.6 C) 97.8 F (36.6 C) 97.9 F (36.6 C)  TempSrc: Oral   Oral  SpO2: 98% 97% 98% 99%  Weight:      Height:       General: alert, cooperative, and no distress Lochia: appropriate Uterine Fundus: firm Incision: N/A DVT Evaluation: No evidence of DVT seen on physical  exam. Negative Homan's sign. Labs: Lab Results  Component Value Date   WBC 12.2 (H) 08/21/2021   HGB 9.1 (L) 08/21/2021   HCT 28.4 (L) 08/21/2021   MCV 87.7 08/21/2021   PLT 242 08/21/2021   CMP Latest Ref Rng & Units 12/09/2019  Glucose 65 - 99 mg/dL 94  BUN 6 - 20 mg/dL 11  Creatinine 0.57 - 1.00 mg/dL 0.68  Sodium 134 - 144 mmol/L 137  Potassium 3.5 - 5.2 mmol/L 4.2  Chloride 96 - 106 mmol/L 104  CO2 20 - 29 mmol/L -  Calcium 8.7 - 10.2 mg/dL 9.2  Total Protein 6.0 - 8.5 g/dL 7.1  Total Bilirubin 0.0 - 1.2 mg/dL 0.3  Alkaline Phos 48 - 121 IU/L 56  AST 0 - 40 IU/L 17  ALT 0 - 32 IU/L 12   Edinburgh Score: Edinburgh Postnatal Depression Scale Screening Tool 08/21/2021  I have been able to laugh and see the funny side of things. 0  I have looked forward with enjoyment to things. 0  I have blamed myself unnecessarily when things went wrong. 1  I have been anxious or worried for no good reason. 1  I have felt scared or panicky for no good reason. 0  Things have been getting on top of me. 1  I have been so unhappy that I have had difficulty sleeping. 1  I have felt sad  or miserable. 2  I have been so unhappy that I have been crying. 0  The thought of harming myself has occurred to me. 0  Edinburgh Postnatal Depression Scale Total 6      After visit meds:  Allergies as of 08/21/2021       Reactions   Nexium [esomeprazole Magnesium] Nausea And Vomiting        Medication List     TAKE these medications    Butalbital-APAP-Caffeine 50-325-40 MG capsule Take 1-2 capsules by mouth every 6 (six) hours as needed for headache.   prenatal multivitamin Tabs tablet Take 1 tablet by mouth daily at 12 noon.         Discharge home in stable condition Infant Feeding: Bottle Infant Disposition:home with mother Discharge instruction: per After Visit Summary and Postpartum booklet. Activity: Advance as tolerated. Pelvic rest for 6 weeks.  Diet: routine  diet Anticipated Birth Control: IUD or other options discussed Postpartum Appointment:6 weeks Additional Postpartum F/U:  no other Future Appointments: Future Appointments  Date Time Provider [place]  09/27/2021 10:15 AM Gae Dry, MD [facility] None   Follow up Visit:  Follow-up Information     Gae Dry, MD. Go in 6 week(s).   Specialty: Obstetrics and Gynecology Why: For Post Partum Check, 09/27/2021 as scheduled Contact information: [address] [place] [address] [phone]                08/21/2021 Hoyt Koch, MD

## 2021-08-20 NOTE — Progress Notes (Signed)
Pt unable to void. CNM notified. Verbal order for in and out cath. Notify CNM if pt unable to void after 4-6 hours

## 2021-08-21 LAB — CBC
HCT: 28.4 % — ABNORMAL LOW (ref 36.0–46.0)
Hemoglobin: 9.1 g/dL — ABNORMAL LOW (ref 12.0–15.0)
MCH: 28.1 pg (ref 26.0–34.0)
MCHC: 32 g/dL (ref 30.0–36.0)
MCV: 87.7 fL (ref 80.0–100.0)
Platelets: 242 10*3/uL (ref 150–400)
RBC: 3.24 MIL/uL — ABNORMAL LOW (ref 3.87–5.11)
RDW: 12.8 % (ref 11.5–15.5)
WBC: 12.2 10*3/uL — ABNORMAL HIGH (ref 4.0–10.5)
nRBC: 0 % (ref 0.0–0.2)

## 2021-08-21 NOTE — Discharge Instructions (Signed)

## 2021-08-21 NOTE — Progress Notes (Signed)
Patient discharged home with family.  Discharge instructions, when to follow up, and prescriptions reviewed with patient.  Patient verbalized understanding. Patient will be escorted out by auxiliary.   

## 2021-08-21 NOTE — Anesthesia Postprocedure Evaluation (Signed)
Anesthesia Post Note  Patient: Jennifer Nelson  Procedure(s) Performed: AN AD HOC LABOR EPIDURAL  Patient location during evaluation: Mother Baby Anesthesia Type: Epidural Level of consciousness: awake and alert Pain management: pain level controlled Vital Signs Assessment: post-procedure vital signs reviewed and stable Respiratory status: spontaneous breathing, nonlabored ventilation and respiratory function stable Cardiovascular status: stable Postop Assessment: no headache, no backache, epidural receding, able to ambulate and no apparent nausea or vomiting Anesthetic complications: no   No notable events documented.   Last Vitals:  Vitals:   08/21/21 0118 08/21/21 0759  BP: 107/74 121/85  Pulse: 74 69  Resp: 20 18  Temp: 36.6 C 36.6 C  SpO2: 98% 99%    Last Pain:  Vitals:   08/21/21 0830  TempSrc:   PainSc: 1                  Foye Deer

## 2021-09-06 ENCOUNTER — Ambulatory Visit (INDEPENDENT_AMBULATORY_CARE_PROVIDER_SITE_OTHER): Payer: Managed Care, Other (non HMO) | Admitting: Obstetrics & Gynecology

## 2021-09-06 ENCOUNTER — Encounter: Payer: Self-pay | Admitting: Obstetrics & Gynecology

## 2021-09-06 ENCOUNTER — Other Ambulatory Visit: Payer: Self-pay

## 2021-09-06 VITALS — BP 120/80 | Wt 184.0 lb

## 2021-09-06 DIAGNOSIS — O9229 Other disorders of breast associated with pregnancy and the puerperium: Secondary | ICD-10-CM

## 2021-09-06 NOTE — Progress Notes (Signed)
HPI:      Jennifer Nelson is a 26 y.o. [id number] who is postpartum, presents today for a problem visit.  She complains of breast lump on the rightside which she first noticed two days ago.  It has not significantly changed.  Associated symptoms include tenderness to palpation.  Denies nipple discharge or skin changes.  No fever.  Not breast feeding.  No nipple discharge or milk production.  She is 2 weeks postpartum and engorgement has come and gone Prior breast problems: No Family History: Breast Cancer-relatedfamily history is not on file.  PMHx: She  has a past medical history of GERD (gastroesophageal reflux disease), Headache, Pregnancy, and Vaginal Pap smear, abnormal. Also,  has a past surgical history that includes Dental surgery., family history includes Asthma in her brother; Diabetes in her maternal grandmother; Glaucoma in her father; Berenice Primas' disease in her mother; Hypertension in her father.,  reports that she has never smoked. She has never used smokeless tobacco. She reports that she does not currently use alcohol. She reports that she does not use drugs.  She has a current medication list which includes the following prescription(s): butalbital-apap-caffeine and prenatal multivitamin. Also, is allergic to nexium [esomeprazole magnesium].  Review of Systems  Constitutional:  Negative for chills, fever and malaise/fatigue.  HENT:  Negative for congestion, sinus pain and sore throat.   Eyes:  Negative for blurred vision and pain.  Respiratory:  Negative for cough and wheezing.   Cardiovascular:  Negative for chest pain and leg swelling.  Gastrointestinal:  Negative for abdominal pain, constipation, diarrhea, heartburn, nausea and vomiting.  Genitourinary:  Negative for dysuria, frequency, hematuria and urgency.  Musculoskeletal:  Negative for back pain, joint pain, myalgias and neck pain.  Skin:  Negative for itching and rash.  Neurological:  Negative for dizziness,  tremors and weakness.  Endo/Heme/Allergies:  Does not bruise/bleed easily.  Psychiatric/Behavioral:  Negative for depression. The patient is not nervous/anxious and does not have insomnia.    Objective: BP 120/80    Wt 184 lb (83.5 kg)    BMI 29.70 kg/m  Physical Exam Constitutional:      General: She is not in acute distress.    Appearance: Normal appearance. She is well-developed.  Genitourinary:  Breasts:    Right: Mass present. No inverted nipple, nipple discharge, skin change or tenderness.     Left: No inverted nipple, mass, nipple discharge, skin change or tenderness.     Breast exam comments: Right UOQ near axilla 3-5 mm mildly T nodule , no warmth or erythema No other lesions Areola and nipple normal. Cardiovascular:     Rate and Rhythm: Normal rate and regular rhythm.     Pulses: Normal pulses.     Heart sounds: Normal heart sounds. No murmur heard.   No friction rub. No gallop.  Pulmonary:     Effort: Pulmonary effort is normal.     Breath sounds: Normal breath sounds.  Chest:     Chest wall: No mass, tenderness or edema.  Musculoskeletal:        General: Normal range of motion.  Lymphadenopathy:     Upper Body:     Right upper body: No pectoral adenopathy.     Left upper body: No pectoral adenopathy.  Neurological:     Mental Status: She is alert and oriented to person, place, and time.  Skin:    General: Skin is warm and dry.     Findings: No abrasion, bruising, erythema, lesion or  rash.  Psychiatric:        Speech: Speech normal.        Behavior: Behavior normal.        Judgment: Judgment normal.  Vitals reviewed.    ASSESSMENT/PLAN: breast mass, likely benign 1. Palpable mass of breast in pregnant/postpartum patient Likely lymph node No skin changes or associated sx's for mastitis Monitor, and will Rx ABX if skin changes, fever, growth of lump  Barnett Applebaum, MD, [facility] 09/06/2021  4:48 PM

## 2021-09-14 ENCOUNTER — Encounter: Payer: Self-pay | Admitting: Obstetrics & Gynecology

## 2021-09-15 ENCOUNTER — Ambulatory Visit (INDEPENDENT_AMBULATORY_CARE_PROVIDER_SITE_OTHER): Payer: Managed Care, Other (non HMO) | Admitting: Advanced Practice Midwife

## 2021-09-15 ENCOUNTER — Other Ambulatory Visit: Payer: Self-pay

## 2021-09-15 ENCOUNTER — Telehealth: Payer: Self-pay

## 2021-09-15 ENCOUNTER — Encounter: Payer: Self-pay | Admitting: Advanced Practice Midwife

## 2021-09-15 VITALS — BP 113/74 | Ht 67.0 in | Wt 183.0 lb

## 2021-09-15 DIAGNOSIS — O99345 Other mental disorders complicating the puerperium: Secondary | ICD-10-CM

## 2021-09-15 DIAGNOSIS — F430 Acute stress reaction: Secondary | ICD-10-CM | POA: Insufficient documentation

## 2021-09-15 DIAGNOSIS — F418 Other specified anxiety disorders: Secondary | ICD-10-CM

## 2021-09-15 DIAGNOSIS — F53 Postpartum depression: Secondary | ICD-10-CM

## 2021-09-15 MED ORDER — HYDROXYZINE HCL 25 MG PO TABS: 25.0000 mg | ORAL_TABLET | Freq: Three times a day (TID) | ORAL | 3 refills | Status: DC | PRN

## 2021-09-15 MED ORDER — SERTRALINE HCL 50 MG PO TABS: 50.0000 mg | ORAL_TABLET | Freq: Every day | ORAL | 11 refills | Status: DC

## 2021-09-15 NOTE — Telephone Encounter (Signed)
Pt calling; was seen by JEG in [place] earlier this pm; two rxs was sent in but pharm says they haven't recv'd them.  [phone]  Called pharm; spoke c Shawna Orleans who states the rxs have come through.  Pt aware.

## 2021-09-15 NOTE — Progress Notes (Signed)
Patient ID: Jennifer Nelson, female   DOB: [birth year], 26 y.o.   MRN: [id number]  Reason for Consult: Postpartum Care   Subjective:  HPI:  Jennifer Nelson is a 27 y.o. female being seen at 4 weeks postpartum with concern for depression and anxiety. She reports a most likely long standing issue that was never addressed. She has never taken medication for depression. There is a strong family history of depression/anxiety. She has seen a therapist in the past which she did not feel was worthwhile. She "lied" on postpartum screening forms after the birth of her son reporting that all was well.  Acutely she began to feel an increase in stress and anxiety on Saturday 4 days ago while preparing for her son's birthday party. On Sunday morning she was "losing control" and her husband asked her "what's wrong?" Her response; "everything". On Monday she had a bad headache that is still lingering today. Nothing is helping the headache. Yesterday she started crying and feeling overwhelmed all the time. She knew her baby was hungry and needed a bottle and she was unable to prepare the bottle. Her husband came home from work to take care of the baby. Her mother offered to help and today she is at the house. The patient admits adequate sleep since her husband takes care of nighttime feedings/diapers. Jennifer Nelson also reports anger/resentment issues. She does not feel bonded to baby or love for baby. 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 Also stressed the importance of sleep, support system and coping techniques.   Past Medical History:  Diagnosis Date   GERD (gastroesophageal reflux disease)    Headache    Pregnancy    Vaginal Pap smear, abnormal    Family History  Problem Relation Age of Onset   Graves' disease Mother    Hypertension Father    Glaucoma Father    Asthma Brother    Diabetes Maternal Grandmother    Past Surgical History:  Procedure  Laterality Date   DENTAL SURGERY      Short Social History:  Social History   Tobacco Use   Smoking status: Never   Smokeless tobacco: Never  Substance Use Topics   Alcohol use: Not Currently    Allergies  Allergen Reactions   Nexium [Esomeprazole Magnesium] Nausea And Vomiting    Current Outpatient Medications  Medication Sig Dispense Refill   Prenatal Vit-Fe Fumarate-FA (PRENATAL MULTIVITAMIN) TABS tablet Take 1 tablet by mouth daily at 12 noon.     Butalbital-APAP-Caffeine 50-325-40 MG capsule Take 1-2 capsules by mouth every 6 (six) hours as needed for headache. (Patient not taking: Reported on 09/15/2021) 30 capsule 0   No current facility-administered medications for this visit.    Review of Systems  Constitutional:  Negative for chills and fever.  HENT:  Negative for congestion, ear discharge, ear pain, hearing loss, sinus pain and sore throat.   Eyes:  Negative for blurred vision and double vision.  Respiratory:  Negative for cough, shortness of breath and wheezing.   Cardiovascular:  Negative for chest pain, palpitations and leg swelling.  Gastrointestinal:  Negative for abdominal pain, blood in stool, constipation, diarrhea, heartburn, melena, nausea and vomiting.  Genitourinary:  Negative for dysuria, flank pain, frequency, hematuria and urgency.  Musculoskeletal:  Negative for back pain, joint pain and myalgias.  Skin:  Negative for itching and rash.  Neurological:  Positive for headaches. Negative for dizziness, tingling, tremors, sensory change, speech change, focal weakness, seizures,  loss of consciousness and weakness.  Endo/Heme/Allergies:  Negative for environmental allergies. Does not bruise/bleed easily.  Psychiatric/Behavioral:  Positive for depression. Negative for hallucinations, memory loss, substance abuse and suicidal ideas. The patient is not nervous/anxious and does not have insomnia.        Positive for anxiety and anger       Objective:   Objective   Vitals:   09/15/21 1431  BP: 113/74  Weight: 183 lb (83 kg)  Height: 5\' 7"  (1.702 m)   Body mass index is 28.66 kg/m. Constitutional: Well nourished, well developed female in mild emotional distress.  HEENT: normal Skin: Warm and dry.   Extremity:  no edema   Respiratory:  Normal respiratory effort Neuro: DTRs 2+, Cranial nerves grossly intact Psych: Alert and Oriented x3. No memory deficits. Tearful.   Data:  Edinburgh Postnatal Depression Scale - 09/15/21 1457       Edinburgh Postnatal Depression Scale:  In the Past 7 Days   I have been able to laugh and see the funny side of things. 2    I have looked forward with enjoyment to things. 1    I have blamed myself unnecessarily when things went wrong. 2    I have been anxious or worried for no good reason. 3    I have felt scared or panicky for no good reason. 2    Things have been getting on top of me. 3    I have been so unhappy that I have had difficulty sleeping. 2    I have felt sad or miserable. 2    I have been so unhappy that I have been crying. 2    The thought of harming myself has occurred to me. 0    Edinburgh Postnatal Depression Scale Total 19             Depression screen [facility] 2/9 09/15/2021  Decreased Interest 1  Down, Depressed, Hopeless 2  PHQ - 2 Score 3  Altered sleeping 2  Tired, decreased energy 3  Change in appetite 3  Feeling bad or failure about yourself  3  Trouble concentrating 2  Moving slowly or fidgety/restless 0  Suicidal thoughts 0  PHQ-9 Score 16  Difficult doing work/chores Very difficult    GAD 7 : Generalized Anxiety Score 09/15/2021  Nervous, Anxious, on Edge 3  Control/stop worrying 3  Worry too much - different things 3  Trouble relaxing 3  Restless 2  Easily annoyed or irritable 3  Afraid - awful might happen 3  Total GAD 7 Score 20  Anxiety Difficulty Very difficult         Assessment/Plan:     26 y.o. 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

## 2021-09-15 NOTE — Patient Instructions (Signed)
Managing Depression, Adult Depression is a mental health condition that affects your thoughts, feelings, and actions. Being diagnosed with depression can bring you relief if you did not know why you have felt or behaved a certain way. It could also leave you feeling overwhelmed with uncertainty about your future. Preparing yourself to manage your symptoms can help you feel more positive about your future. How to manage lifestyle changes Managing stress Stress is your body's reaction to life changes and events, both good and bad. Stress can add to your feelings of depression. Learning to manage your stress can help lessen your feelings of depression. Try some of the following approaches to reducing your stress (stress reduction techniques): Listen to music that you enjoy and that inspires you. Try using a meditation app or take a meditation class. Develop a practice that helps you connect with your spiritual self. Walk in nature, pray, or go to a place of worship. Do some deep breathing. To do this, inhale slowly through your nose. Pause at the top of your inhale for a few seconds and then exhale slowly, letting your muscles relax. Practice yoga to help relax and work your muscles. Choose a stress reduction technique that suits your lifestyle and personality. These techniques take time and practice to develop. Set aside 5-15 minutes a day to do them. Therapists can offer training in these techniques. Other things you can do to manage stress include: Keeping a stress diary. Knowing your limits and saying no when you think something is too much. Paying attention to how you react to certain situations. You may not be able to control everything, but you can change your reaction. Adding humor to your life by watching funny films or TV shows. Making time for activities that you enjoy and that relax you.  Medicines Medicines, such as antidepressants, are often a part of treatment for depression. Talk  with your pharmacist or health care provider about all the medicines, supplements, and herbal products that you take, their possible side effects, and what medicines and other products are safe to take together. Make sure to report any side effects you may have to your health care provider. Relationships Your health care provider may suggest family therapy, couples therapy, or individual therapy as part of your treatment. How to recognize changes Everyone responds differently to treatment for depression. As you recover from depression, you may start to: Have more interest in doing activities. Feel less hopeless. Have more energy. Overeat less often, or have a better appetite. Have better mental focus. It is important to recognize if your depression is not getting better or is getting worse. The symptoms you had in the beginning may return, such as: Tiredness (fatigue) or low energy. Eating too much or too little. Sleeping too much or too little. Feeling restless, agitated, or hopeless. Trouble focusing or making decisions. Unexplained physical complaints. Feeling irritable, angry, or aggressive. If you or your family members notice these symptoms coming back, let your health care provider know right away. Follow these instructions at home: Activity  Try to get some form of exercise each day, such as walking, biking, swimming, or lifting weights. Practice stress reduction techniques. Engage your mind by taking a class or doing some volunteer work. Lifestyle Get the right amount and quality of sleep. Cut down on using caffeine, tobacco, alcohol, and other potentially harmful substances. Eat a healthy diet that includes plenty of vegetables, fruits, whole grains, low-fat dairy products, and lean protein. Do not eat a lot  of foods that are high in solid fats, added sugars, or salt (sodium). General instructions Take over-the-counter and prescription medicines only as told by your health  care provider. Keep all follow-up visits as told by your health care provider. This is important. Where to find support Talking to others Friends and family members can be sources of support and guidance. Talk to trusted friends or family members about your condition. Explain your symptoms to them, and let them know that you are working with a health care provider to treat your depression. Tell friends and family members how they also can be helpful. Finances Find appropriate mental health providers that fit with your financial situation. Talk with your health care provider about options to get reduced prices on your medicines. Where to find more information You can find support in your area from: Anxiety and Depression Association of America (ADAA): www.adaa.org Mental Health America: www.mentalhealthamerica.net [employer] on Mental Illness: www.nami.org Contact a health care provider if: You stop taking your antidepressant medicines, and you have any of these symptoms: Nausea. Headache. Light-headedness. Chills and body aches. Not being able to sleep (insomnia). You or your friends and family think your depression is getting worse. Get help right away if: You have thoughts of hurting yourself or others. If you ever feel like you may hurt yourself or others, or have thoughts about taking your own life, get help right away. Go to your nearest emergency department or: Call your local emergency services (911 in the [place] Call a suicide crisis helpline, such as the [facility] at 347-667-2778 or 988 in the [place]. This is open 24 hours a day in the [place] Text the Crisis Text Line at 506-882-3703 (in the [place].). Summary If you are diagnosed with depression, preparing yourself to manage your symptoms is a good way to feel positive about your future. Work with your health care provider on a management plan that includes stress reduction techniques, medicines (if  applicable), therapy, and healthy lifestyle habits. Keep talking with your health care provider about how your treatment is working. If you have thoughts about taking your own life, call a suicide crisis helpline or text a crisis text line. This information is not intended to replace advice given to you by your health care provider. Make sure you discuss any questions you have with your health care provider. Document Revised: 02/03/2021 Document Reviewed: 05/22/2019 Elsevier Patient Education  2022 [place], Adult After being diagnosed with anxiety, you may be relieved to know why you have felt or behaved a certain way. You may also feel overwhelmed about the treatment ahead and what it will mean for your life. With care and support, you can manage this condition. How to manage lifestyle changes Managing stress and anxiety Stress is your body's reaction to life changes and events, both good and bad. Most stress will last just a few hours, but stress can be ongoing and can lead to more than just stress. Although stress can play a major role in anxiety, it is not the same as anxiety. Stress is usually caused by something external, such as a deadline, test, or competition. Stress normally passes after the triggering event has ended.  Anxiety is caused by something internal, such as imagining a terrible outcome or worrying that something will go wrong that will devastate you. Anxiety often does not go away even after the triggering event is over, and it can become long-term (chronic) worry. It is important to understand  the differences between stress and anxiety and to manage your stress effectively so that it does not lead to an anxious response. Talk with your health care provider or a counselor to learn more about reducing anxiety and stress. He or she may suggest tension reduction techniques, such as: Music therapy. Spend time creating or listening to music that you enjoy and that  inspires you. Mindfulness-based meditation. Practice being aware of your normal breaths while not trying to control your breathing. It can be done while sitting or walking. Centering prayer. This involves focusing on a word, phrase, or sacred image that means something to you and brings you peace. Deep breathing. To do this, expand your stomach and inhale slowly through your nose. Hold your breath for 3-5 seconds. Then exhale slowly, letting your stomach muscles relax. Self-talk. Learn to notice and identify thought patterns that lead to anxiety reactions and change those patterns to thoughts that feel peaceful. Muscle relaxation. Taking time to tense muscles and then relax them. Choose a tension reduction technique that fits your lifestyle and personality. These techniques take time and practice. Set aside 5-15 minutes a day to do them. Therapists can offer counseling and training in these techniques. The training to help with anxiety may be covered by some insurance plans. Other things you can do to manage stress and anxiety include: Keeping a stress diary. This can help you learn what triggers your reaction and then learn ways to manage your response. Thinking about how you react to certain situations. You may not be able to control everything, but you can control your response. Making time for activities that help you relax and not feeling guilty about spending your time in this way. Doing visual imagery. This involves imagining or creating mental pictures to help you relax. Practicing yoga. Through yoga poses, you can lower tension and promote relaxation.  Medicines Medicines can help ease symptoms. Medicines for anxiety include: Antidepressant medicines. These are usually prescribed for long-term daily control. Anti-anxiety medicines. These may be added in severe cases, especially when panic attacks occur. Medicines will be prescribed by a health care provider. When used together, medicines,  psychotherapy, and tension reduction techniques may be the most effective treatment. Relationships Relationships can play a big part in helping you recover. Try to spend more time connecting with trusted friends and family members. Consider going to couples counseling if you have a partner, taking family education classes, or going to family therapy. Therapy can help you and others better understand your condition. How to recognize changes in your anxiety Everyone responds differently to treatment for anxiety. Recovery from anxiety happens when symptoms decrease and stop interfering with your daily activities at home or work. This may mean that you will start to: Have better concentration and focus. Worry will interfere less in your daily thinking. Sleep better. Be less irritable. Have more energy. Have improved memory. It is also important to recognize when your condition is getting worse. Contact your health care provider if your symptoms interfere with home or work and you feel like your condition is not improving. Follow these instructions at home: Activity Exercise. Adults should do the following: Exercise for at least 150 minutes each week. The exercise should increase your heart rate and make you sweat (moderate-intensity exercise). Strengthening exercises at least twice a week. Get the right amount and quality of sleep. Most adults need 7-9 hours of sleep each night. Lifestyle  Eat a healthy diet that includes plenty of vegetables, fruits, whole grains,  low-fat dairy products, and lean protein. Do not eat a lot of foods that are high in fats, added sugars, or salt (sodium). Make choices that simplify your life. Do not use any products that contain nicotine or tobacco. These products include cigarettes, chewing tobacco, and vaping devices, such as e-cigarettes. If you need help quitting, ask your health care provider. Avoid caffeine, alcohol, and certain over-the-counter cold  medicines. These may make you feel worse. Ask your pharmacist which medicines to avoid. General instructions Take over-the-counter and prescription medicines only as told by your health care provider. Keep all follow-up visits. This is important. Where to find support You can get help and support from these sources: Self-help groups. Online and [employer]. A trusted spiritual leader. Couples counseling. Family education classes. Family therapy. Where to find more information You may find that joining a support group helps you deal with your anxiety. The following sources can help you locate counselors or support groups near you: [place]: www.mentalhealthamerica.net Anxiety and Depression Association of [place] (ADAA): https://www.clark.net/ National Alliance on Mental Illness (NAMI): www.nami.org Contact a health care provider if: You have a hard time staying focused or finishing daily tasks. You spend many hours a day feeling worried about everyday life. You become exhausted by worry. You start to have headaches or frequently feel tense. You develop chronic nausea or diarrhea. Get help right away if: You have a racing heart and shortness of breath. You have thoughts of hurting yourself or others. If you ever feel like you may hurt yourself or others, or have thoughts about taking your own life, get help right away. Go to your nearest emergency department or: Call your local emergency services (911 in the [place] Call a suicide crisis helpline, such as the [facility] at (936) 714-4947 or 988 in the [place]. This is open 24 hours a day in the [place] Text the Crisis Text Line at 615 014 4628 (in the [place].). Summary Taking steps to learn and use tension reduction techniques can help calm you and help prevent triggering an anxiety reaction. When used together, medicines, psychotherapy, and tension reduction techniques may be the most effective  treatment. Family, friends, and partners can play a big part in supporting you. This information is not intended to replace advice given to you by your health care provider. Make sure you discuss any questions you have with your health care provider. Document Revised: 02/03/2021 Document Reviewed: 11/01/2020 Elsevier Patient Education  [place] Mood and Anxiety Disorder Perinatal mood and anxiety disorder (PMAD) is a mental health condition that happens when a person feels excessive sadness, anger, or worry and tension (anxiety) during pregnancy or during the first few months after the birth. This condition can last a few months or may continue for years if left untreated. PMAD may cause serious problems for the mother, her baby, or the father if not properly managed. Depression and anxiety can interfere with the ability to take care of the baby. It also may affect work, school, relationships, and other everyday activities. Having the baby blues is considered normal. Mild to moderate levels of sadness, exhaustion, and generally struggling with being a parent are considered the blues. Many parents experience these during the first 1-2 weeks after giving birth. If these symptoms become worse or last too long, it may be PMAD. What are the causes? The exact cause of this condition is not known. It may result from a combination of hormone changes and biological, social, and psychological  factors. What increases the risk? The following factors may make you more likely to develop this condition: Having a personal or family history of depression, anxiety, or mood disorders. Experiencing a stressful life event during pregnancy, such as the death of a loved one. Having additional life stress, such as being a single parent. Having thyroid problems. What are the signs or symptoms? Symptoms of this condition include: Physical symptoms, such as: Panic attacks. These are intense episodes of  fear or discomfort that may also cause sweating, nausea, shortness of breath, or fear of dying. They usually last 5-15 minutes but can last longer. Performing repetitive tasks to relieve stress or worry (obsessive compulsive disorder, or OCD). Problems with appetite or sleep. Emotional symptoms, such as: Excessive worry about problems or feeling like something bad will happen (generalized anxiety disorder). Phobias, which are fears of certain objects or situations. Separation anxiety, or fear and stress about leaving certain people or loved ones. Behavioral symptoms, such as: Depression, or lack of motivation and energy. Intense mood swings involving emotional highs and lows. Feeling out of control or like you are going crazy. Having difficulty bonding with your baby. Some people also have trouble relaxing, problems concentrating, problems sleeping, frequent nightmares, and disturbing thoughts. PMAD can be different for everyone and can affect men as well as women. How is this diagnosed? This condition is diagnosed based on a physical exam and mental evaluation. In some cases, your health care provider may use an anxiety or depression screening tool. This includes a list of questions that can help a health care provider diagnose PMAD. You may be referred to a mental health expert who specializes in treating PMAD. How is this treated? This condition may be treated with: Talk therapy with a mental health professional. This may be family therapy, marriage therapy, cognitive behavioral therapy, or interpersonal therapy. Medicines. Your health care provider will discuss medicines that are safe to use during pregnancy and breastfeeding. Stress reduction therapies, such as mindfulness, deep breathing, or guided muscle relaxation. Support groups, early childhood education, or other groups to help with being a parent. Follow these instructions at home: Lifestyle Do not use any products that contain  nicotine or tobacco. These products include cigarettes, chewing tobacco, and vaping devices, such as e-cigarettes. If you need help quitting, ask your health care provider. Do not drink alcohol when you are pregnant. It is also safest not to drink alcohol if you are breastfeeding. After your baby is born, if you drink alcohol: Limit how much you have to 0-1 drink a day. Be aware of how much alcohol is in your drink. In the [place] one drink equals one 12 oz bottle of beer (355 mL), one 5 oz glass of wine (148 mL), or one 1 oz glass of hard liquor (44 mL). Consider joining a support group for new mothers. Ask your health care provider for recommendations. Take good care of yourself. Make sure you: Get as much rest as possible. Talk with your partner about sharing the responsibility of getting up with your baby if possible. Make sleep a priority. Eat a healthy diet. This includes plenty of fruits and vegetables, whole grains, and lean proteins. Exercise regularly, as told by your health care provider. Ask your health care provider what exercises are safe for you. Talk with your partner about making sure you both have opportunities to exercise. General instructions Take over-the-counter and prescription medicines only as told by your health care provider. Talk with your partner or family members  about your feelings during pregnancy. Share your concerns, needs, or anxieties with each other. Do not be afraid to ask for help. Find a mental health professional, if needed. Ask for help with tasks or chores when you need it. Ask friends and family members to provide meals, watch your children, or help with cleaning. If friends or family are not able to help, consider finding a licensed child care provider or professional house cleaner if needed. Let your partner know what you need. He or she may be struggling too. Keep all follow-up visits. This is important. Contact a health care provider if: You or people  close to you notice that you have symptoms of anxiety or depression. Your symptoms of anxiety or depression get worse. You take medicines and have side effects that are uncomfortable or difficult to tolerate. Get help right away if: You feel like hurting yourself, your baby, or someone else. If you feel like you may hurt yourself or others, or have thoughts about taking your own life, get help right away. Go to your nearest emergency department or: Call your local emergency services (911 in the [place] Call a suicide crisis helpline, such as the [facility] at 564-739-2418 or 988 in the [place]. This is open 24 hours a day in the [place] Text the Crisis Text Line at 907-542-0863 (in the [place].). Summary Perinatal mood and anxiety disorder (PMAD) is when a woman or her partner feels excessive sadness, anger, or worry and tension (anxiety) during pregnancy or during the first few months after the birth. PMAD may include depression, intense mood swings, panic attacks, separation anxiety, phobias, or generalized anxiety. PMAD can cause problems for the mother, the baby, or the father if not properly managed. This condition is treated with medicines, talk therapy, stress reduction therapies, or a combination of treatments. Talk with your partner or family members about your concerns or fears. Ask for help when you need it. This information is not intended to replace advice given to you by your health care provider. Make sure you discuss any questions you have with your health care provider. Document Revised: 02/03/2021 Document Reviewed: 01/03/2020 Elsevier Patient Education  2022 [place], Adult Feeling a certain amount of stress is normal. Stress helps our body and mind get ready to deal with the demands of life. Stress hormones can motivate you to do well at work and meet your responsibilities. But severe or long-term (chronic) stress can affect your mental and  physical health. Chronic stress puts you at higher risk for: Anxiety and depression. Other health problems such as digestive problems, muscle aches, heart disease, high blood pressure, and stroke. What are the causes? Common causes of stress include: Demands from work, such as deadlines, feeling overworked, or having long hours. Pressures at home, such as money issues, disagreements with a spouse, or parenting issues. Pressures from major life changes, such as divorce, moving, loss of a loved one, or chronic illness. You may be at higher risk for stress-related problems if you: Do not get enough sleep. Are in poor health. Do not have emotional support. Have a mental health disorder such as anxiety or depression. How to recognize stress Stress can make you: Have trouble sleeping. Feel sad, anxious, irritable, or overwhelmed. Lose your appetite. Overeat or want to eat unhealthy foods. Want to use drugs or alcohol. Stress can also cause physical symptoms, such as: Sore, tense muscles, especially in the shoulders and neck. Headaches. Trouble breathing. A faster heart  rate. Stomach pain, nausea, or vomiting. Diarrhea or constipation. Trouble concentrating. Follow these instructions at home: Eating and drinking Eat a healthy diet. This includes: Eating foods that are high in fiber, such as beans, whole grains, and fresh fruits and vegetables. Limiting foods that are high in fat and processed sugars, such as fried or sweet foods. Do not skip meals or overeat. Drink enough fluid to keep your urine pale yellow. Alcohol use Do not drink alcohol if: Your health care provider tells you not to drink. You are pregnant, may be pregnant, or are planning to become pregnant. Drinking alcohol is a way some people try to ease their stress. This can be dangerous, so if you drink alcohol: Limit how much you have to: 0-1 drink a day for women. 0-2 drinks a day for men. Know how much alcohol is in  your drink. In the [place] one drink equals one 12 oz bottle of beer (355 mL), one 5 oz glass of wine (148 mL), or one 1 oz glass of hard liquor (44 mL). Activity  Include 30 minutes of exercise in your daily schedule. Exercise is a good stress reducer. Include time in your day for an activity that you find relaxing. Try taking a walk, going on a bike ride, reading a book, or listening to music. Schedule your time in a way that lowers stress, and keep a regular schedule. Focus on doing what is most important to get done. Lifestyle Identify the source of your stress and your reaction to it. See a therapist who can help you change unhelpful reactions. When there are stressful events: Talk about them with family, friends, or coworkers. Try to think realistically about stressful events and not ignore them or overreact. Try to find the positives in a stressful situation and not focus on the negatives. Cut back on responsibilities at work and home, if possible. Ask for help from friends or family members if you need it. Find ways to manage stress, such as: Mindfulness, meditation, or deep breathing. Yoga or tai chi. Progressive muscle relaxation. Spending time in nature. Doing art, playing music, or reading. Making time for fun activities. Spending time with family and friends. Get support from family, friends, or spiritual resources. General instructions Get enough sleep. Try to go to sleep and get up at about the same time every day. Take over-the-counter and prescription medicines only as told by your health care provider. Do not use any products that contain nicotine or tobacco. These products include cigarettes, chewing tobacco, and vaping devices, such as e-cigarettes. If you need help quitting, ask your health care provider. Do not use drugs or smoke to deal with stress. Keep all follow-up visits. This is important. Where to find support Talk with your health care provider about stress  management or finding a support group. Find a therapist to work with you on your stress management techniques. Where to find more information [employer] on Mental Illness: www.nami.org American Psychological Association: TVStere[employer].ch Contact a health care provider if: Your stress symptoms get worse. You are unable to manage your stress at home. You are struggling to stop using drugs or alcohol. Get help right away if: You may be a danger to yourself or others. You have any thoughts of death or suicide. Get help right awayif you feel like you may hurt yourself or others, or have thoughts about taking your own life. Go to your nearest emergency room or: Call 911. Call the [place] at 629-830-8436  or 988 in the [place] This is open 24 hours a day. Text the Crisis Text Line at 828-741-3868. Summary Feeling a certain amount of stress is normal, but severe or long-term (chronic) stress can affect your mental and physical health. Chronic stress can put you at higher risk for anxiety, depression, and other health problems such as digestive problems, muscle aches, heart disease, high blood pressure, and stroke. You may be at higher risk for stress-related problems if you do not get enough sleep, are in poor health, lack emotional support, or have a mental health disorder such as anxiety or depression. Identify the source of your stress and your reaction to it. Try talking about stressful events with family, friends, or coworkers, finding a coping method, or getting support from spiritual resources. If you need more help, talk with your health care provider about finding a support group or a mental health therapist. This information is not intended to replace advice given to you by your health care provider. Make sure you discuss any questions you have with your health care provider. Document Revised: 02/04/2021 Document Reviewed: 02/02/2021 Elsevier Patient Education  [place].

## 2021-09-27 ENCOUNTER — Encounter: Payer: Self-pay | Admitting: Obstetrics & Gynecology

## 2021-09-27 ENCOUNTER — Ambulatory Visit (INDEPENDENT_AMBULATORY_CARE_PROVIDER_SITE_OTHER): Payer: Managed Care, Other (non HMO) | Admitting: Obstetrics & Gynecology

## 2021-09-27 ENCOUNTER — Other Ambulatory Visit: Payer: Self-pay

## 2021-09-27 ENCOUNTER — Other Ambulatory Visit (HOSPITAL_COMMUNITY)
Admission: RE | Admit: 2021-09-27 | Discharge: 2021-09-27 | Disposition: A | Discharge status: Home / Self Care | Payer: Managed Care, Other (non HMO) | Source: Ambulatory Visit | Attending: Obstetrics & Gynecology | Admitting: Obstetrics & Gynecology

## 2021-09-27 DIAGNOSIS — Z124 Encounter for screening for malignant neoplasm of cervix: Secondary | ICD-10-CM | POA: Insufficient documentation

## 2021-09-27 MED ORDER — NORETHINDRONE ACET-ETHINYL EST 1-20 MG-MCG PO TABS
1.0000 | ORAL_TABLET | Freq: Every day | ORAL | 3 refills | Status: DC
Start: 2021-09-27 — End: 2022-04-27

## 2021-09-27 NOTE — Progress Notes (Signed)
?  OBSTETRICS POSTPARTUM CLINIC PROGRESS NOTE ? ?Subjective:  ?  ? Jennifer Nelson is a 26 y.o. [id number] female who presents for a postpartum visit. She is 6 weeks postpartum following a Term pregnancy, Single fetus, or Uncomplicated pregnancy and delivery by Vaginal, no problems at delivery.  I have fully reviewed the prenatal and intrapartum course. ?Anesthesia: epidural.  ?Postpartum course has been complicated by uncomplicated.  ?Baby is feeding by Bottle.  ?Bleeding: patient has  resumed menses.  ?Bowel function is normal. Bladder function is normal.  ?Patient is not sexually active. Contraception method desired is OCP (estrogen/progesterone) and vasectomy.  ?Postpartum depression screening: negative/ improved w Zoloft started 2 weeks ago. [place] 8. ? ?The following portions of the patient's history were reviewed and updated as appropriate: allergies, current medications, past family history, past medical history, past social history, past surgical history, and problem list. ? ?Review of Systems ?Pertinent items are noted in HPI. ? ?Objective:  ? ? BP 122/70   Ht 5\' 7"  (1.702 m)   Wt 189 lb (85.7 kg)   Breastfeeding No   BMI 29.60 kg/m?   ?General:  alert and no distress  ? Breasts:  inspection negative, no nipple discharge or bleeding, no masses or nodularity palpable  ?Lungs: clear to auscultation bilaterally  ?Heart:  regular rate and rhythm, S1, S2 normal, no murmur, click, rub or gallop  ?Abdomen: soft, non-tender; bowel sounds normal; no masses,  no organomegaly.    ? Vulva:  normal  ?Vagina: normal vagina, no discharge, exudate, lesion, or erythema  ?Cervix:  no cervical motion tenderness and no lesions  ?Corpus: normal size, contour, position, consistency, mobility, non-tender  ?Adnexa:  normal adnexa and no mass, fullness, tenderness  ?Rectal Exam: Not performed.  ?      ? ? ?Assessment:  ?Post Partum Care visit ?1. Postpartum care and examination ? ?2. Screening for cervical cancer ?-  Cytology - PAP ? ? ?Plan:  ?See orders and Patient Instructions ?Resume all normal activities ?Follow up in:  12  months or as needed.  ?Rx Lo LoEstrin ?OCPs ?The risks /benefits of OCPs have been explained to the patient in detail.  Product literature has been given to her.  I have instructed her in the use of OCPs and have given her literature reinforcing this information.  I have explained to the patient that OCPs are not as effective for birth control during the first month of use, and that another form of contraception should be used during this time.  Both first-day start and Sunday start have been explained.  The risks and benefits of each was discussed.  She has been made aware of  the fact that other medications may affect the efficacy of OCPs.  I have answered all of her questions, and I believe that she has an understanding of the effectiveness and use of OCPs. ? ?Barnett Applebaum, MD, [place] [facility], [place] ?09/27/2021  10:51 AM ? ?

## 2021-09-28 LAB — CYTOLOGY - PAP: Diagnosis: NEGATIVE

## 2021-10-07 ENCOUNTER — Other Ambulatory Visit: Payer: Self-pay | Admitting: Advanced Practice Midwife

## 2021-11-02 ENCOUNTER — Emergency Department (HOSPITAL_BASED_OUTPATIENT_CLINIC_OR_DEPARTMENT_OTHER)
Admission: EM | Admit: 2021-11-02 | Discharge: 2021-11-02 | Disposition: A | Discharge status: Home / Self Care | Payer: Managed Care, Other (non HMO) | Attending: Emergency Medicine | Admitting: Emergency Medicine

## 2021-11-02 ENCOUNTER — Other Ambulatory Visit: Payer: Self-pay

## 2021-11-02 ENCOUNTER — Ambulatory Visit (INDEPENDENT_AMBULATORY_CARE_PROVIDER_SITE_OTHER): Payer: Managed Care, Other (non HMO) | Admitting: Physician Assistant

## 2021-11-02 ENCOUNTER — Encounter (HOSPITAL_BASED_OUTPATIENT_CLINIC_OR_DEPARTMENT_OTHER): Payer: Self-pay | Admitting: Obstetrics and Gynecology

## 2021-11-02 ENCOUNTER — Encounter: Payer: Self-pay | Admitting: Physician Assistant

## 2021-11-02 ENCOUNTER — Telehealth: Payer: Self-pay | Admitting: *Deleted

## 2021-11-02 ENCOUNTER — Emergency Department (HOSPITAL_BASED_OUTPATIENT_CLINIC_OR_DEPARTMENT_OTHER): Payer: Managed Care, Other (non HMO)

## 2021-11-02 VITALS — BP 116/70 | HR 93 | Temp 98.2°F | Ht 66.0 in | Wt 179.4 lb

## 2021-11-02 DIAGNOSIS — O99345 Other mental disorders complicating the puerperium: Secondary | ICD-10-CM

## 2021-11-02 DIAGNOSIS — R079 Chest pain, unspecified: Secondary | ICD-10-CM

## 2021-11-02 DIAGNOSIS — R946 Abnormal results of thyroid function studies: Secondary | ICD-10-CM | POA: Insufficient documentation

## 2021-11-02 DIAGNOSIS — R7989 Other specified abnormal findings of blood chemistry: Secondary | ICD-10-CM

## 2021-11-02 DIAGNOSIS — R0789 Other chest pain: Secondary | ICD-10-CM | POA: Insufficient documentation

## 2021-11-02 DIAGNOSIS — R1013 Epigastric pain: Secondary | ICD-10-CM | POA: Diagnosis not present

## 2021-11-02 DIAGNOSIS — F418 Other specified anxiety disorders: Secondary | ICD-10-CM

## 2021-11-02 LAB — CBC WITH DIFFERENTIAL/PLATELET
Basophils Absolute: 0 10*3/uL (ref 0.0–0.1)
Basophils Relative: 0.4 % (ref 0.0–3.0)
Eosinophils Absolute: 0.1 10*3/uL (ref 0.0–0.7)
Eosinophils Relative: 1.9 % (ref 0.0–5.0)
HCT: 37 % (ref 36.0–46.0)
Hemoglobin: 12 g/dL (ref 12.0–15.0)
Lymphocytes Relative: 27.1 % (ref 12.0–46.0)
Lymphs Abs: 1.5 10*3/uL (ref 0.7–4.0)
MCHC: 32.3 g/dL (ref 30.0–36.0)
MCV: 83.4 fl (ref 78.0–100.0)
Monocytes Absolute: 0.4 10*3/uL (ref 0.1–1.0)
Monocytes Relative: 8.1 % (ref 3.0–12.0)
Neutro Abs: 3.5 10*3/uL (ref 1.4–7.7)
Neutrophils Relative %: 62.5 % (ref 43.0–77.0)
Platelets: 310 10*3/uL (ref 150.0–400.0)
RBC: 4.44 Mil/uL (ref 3.87–5.11)
RDW: 16 % — ABNORMAL HIGH (ref 11.5–15.5)
WBC: 5.5 10*3/uL (ref 4.0–10.5)

## 2021-11-02 LAB — COMPREHENSIVE METABOLIC PANEL
ALT: 15 U/L (ref 0–35)
AST: 18 U/L (ref 0–37)
Albumin: 4.4 g/dL (ref 3.5–5.2)
Alkaline Phosphatase: 58 U/L (ref 39–117)
BUN: 14 mg/dL (ref 6–23)
CO2: 26 mEq/L (ref 19–32)
Calcium: 9.7 mg/dL (ref 8.4–10.5)
Chloride: 105 mEq/L (ref 96–112)
Creatinine, Ser: 0.64 mg/dL (ref 0.40–1.20)
GFR: 120.43 mL/min (ref >60.00)
Glucose, Bld: 90 mg/dL (ref 70–99)
Potassium: 4.6 mEq/L (ref 3.5–5.1)
Sodium: 138 mEq/L (ref 135–145)
Total Bilirubin: 0.3 mg/dL (ref 0.2–1.2)
Total Protein: 7.5 g/dL (ref 6.0–8.3)

## 2021-11-02 LAB — TROPONIN I (HIGH SENSITIVITY): Troponin I (High Sensitivity): 2 ng/L (ref <18)

## 2021-11-02 LAB — IBC + FERRITIN
Ferritin: 4.3 ng/mL — ABNORMAL LOW (ref 10.0–291.0)
Iron: 41 ug/dL — ABNORMAL LOW (ref 42–145)
Saturation Ratios: 9.6 % — ABNORMAL LOW (ref 20.0–50.0)
TIBC: 425.6 ug/dL (ref 250.0–450.0)
Transferrin: 304 mg/dL (ref 212.0–360.0)

## 2021-11-02 LAB — H. PYLORI ANTIBODY, IGG: H Pylori IgG: NEGATIVE

## 2021-11-02 LAB — LIPASE: Lipase: 35 U/L (ref 11.0–59.0)

## 2021-11-02 LAB — TSH: TSH: <0.01 u[IU]/mL — ABNORMAL LOW (ref 0.35–5.50)

## 2021-11-02 LAB — D-DIMER, QUANTITATIVE: D-Dimer, Quant: 3.52 mcg/mL FEU — ABNORMAL HIGH (ref <0.50)

## 2021-11-02 MED ORDER — IOHEXOL 350 MG/ML SOLN
100.0000 mL | Freq: Once | INTRAVENOUS | Status: AC | PRN
Start: 2021-11-02 — End: 2021-11-02
  Administered 2021-11-02: 75 mL via INTRAVENOUS

## 2021-11-02 MED ORDER — LIDOCAINE VISCOUS HCL 2 % MT SOLN
15.0000 mL | Freq: Once | OROMUCOSAL | Status: AC
Start: 2021-11-02 — End: 2021-11-02
  Administered 2021-11-02: 15 mL via ORAL
  Filled 2021-11-02: qty 15

## 2021-11-02 MED ORDER — PANTOPRAZOLE SODIUM 40 MG PO TBEC
40.0000 mg | DELAYED_RELEASE_TABLET | Freq: Every day | ORAL | 0 refills | Status: DC
Start: 2021-11-02 — End: 2021-11-26

## 2021-11-02 MED ORDER — ALUM & MAG HYDROXIDE-SIMETH 200-200-20 MG/5ML PO SUSP
30.0000 mL | Freq: Once | ORAL | Status: AC
  Administered 2021-11-02: 30 mL via ORAL
  Filled 2021-11-02: qty 30

## 2021-11-02 NOTE — ED Notes (Signed)
Spoke with PA-C Elenore Paddy. Received verbal confirmation to use labs obtained today at patient's primary care office. Creatinine value 0.64. Using this creatinine value the [employer] GFR calculation 123. -Evorn Gong R.T. (R) ?

## 2021-11-02 NOTE — Progress Notes (Signed)
Jennifer Nelson is a 26 y.o. female here to establish care. ? ?History of Present Illness:  ? ?Chief Complaint  ?Patient presents with  ?? Establish Care  ?? Chest Pain  ?  Pt has been having chest pain for a long time but has been worse past 3 weeks. Pt was on Zoloft and was stopped by Psychiatrist till chest pain was evaluated.  ? ? ?Acute Concerns: ?Chest Pain ?Jennifer Nelson states she has been experiencing centered chest pain for several years that has been worsening for the past 3 weeks. States that initial chest pain occurred sporadically for several years and could last anywhere from 2-30 minutes. During these episodes she would experience pain that worsened with movement and breathing. Although this was occurring she thought nothing of it due to her husband experiencing something similar and it occurring so infrequently. Due to this pain now waking her up, worsening in intensity, and becoming more frequent, she decided it should be evaluated. Pt also has been instructed to hold off her psychiatric medications until this issue was further evaluated. She does have a hx of heartburn but states this pain is much different.  ? ?Denies LE swelling/pain, SOB, hx of blood clots, hematochezia, melena, vomiting, abnormal weight changes, rectal bleeding, lightheadedness/dizziness, or recent/past chest wall trauma.  ? ? ?Chronic Issues: ?Postpartum Depression/Anxiety ?Following the birth of her daughter this January, pt began experiencing postpartum depression and anxiety. Upon following up with Charlsie Merles on 09/15/21- 4 weeks postpartum, she expressed how she felt overwhelmed with everything. She also reported anger/resentment issues and not being able to feel bonded to her baby. Although she has a great support system in her husband and mother, she knew she needed to start medication, especially since she also has a Fhx of anxiety and depression.  ? ?Following this she was started on zoloft 50 mg daily and  hydroxyzine 25 mg daily as needed.  Shortly after this she began seeing Roma Schanz, psychiatric NP at [facility]. Although she initially found the zoloft to be beneficial, she soon wasn't seeing a change in her mood anymore. Due to this and the fact that she is having chest pain, she states her psychiatrist would like to start prozac once other possibilities were ruled out. Jennifer Nelson has expressed some hesitance in trialing the medication in the future and wanted a second opinion before doing so. In terms of her hydroxyzine 25 mg, she states she rarely uses this and if she does it is for sleep.  ? ?She is currently not breastfeeding. ? ?Hx of Heartburn ?Pt has been dealing with this issue for years. She did trial nexium but soon stopped use due to the medication causing nausea and vomiting. Since then she has tried managing this issue by adjusting her diet.  ? ? ?  09/15/2021  ?  2:58 PM  ?Depression screen PHQ 2/9  ?Decreased Interest 1  ?Down, Depressed, Hopeless 2  ?PHQ - 2 Score 3  ?Altered sleeping 2  ?Tired, decreased energy 3  ?Change in appetite 3  ?Feeling bad or failure about yourself  3  ?Trouble concentrating 2  ?Moving slowly or fidgety/restless 0  ?Suicidal thoughts 0  ?PHQ-9 Score 16  ?Difficult doing work/chores Very difficult  ? ? ? ?  09/15/2021  ?  2:59 PM  ?GAD 7 : Generalized Anxiety Score  ?Nervous, Anxious, on Edge 3  ?Control/stop worrying 3  ?Worry too much - different things 3  ?Trouble relaxing 3  ?Restless 2  ?  Easily annoyed or irritable 3  ?Afraid - awful might happen 3  ?Total GAD 7 Score 20  ?Anxiety Difficulty Very difficult  ? ? ? ?Other providers/specialists: ?Patient Care Team: ?Inda Coke, PA as PCP - General (Physician Assistant)  ? ?Past Medical History:  ?Diagnosis Date  ?? Anxiety   ?? Depression   ?? GERD (gastroesophageal reflux disease)   ?? Headache   ?? Pregnancy   ?? Vaginal delivery   ? 2020, 2023  ?? Vaginal Pap smear, abnormal   ? ?  ?Social  History  ? ?Tobacco Use  ?? Smoking status: Never  ?? Smokeless tobacco: Never  ?Vaping Use  ?? Vaping Use: Never used  ?Substance Use Topics  ?? Alcohol use: Yes  ?  Comment: socially  ?? Drug use: Never  ? ? ?Past Surgical History:  ?Procedure Laterality Date  ?? DENTAL SURGERY    ? ? ?Family History  ?Problem Relation Age of Onset  ?? Graves' disease Mother   ?? Hypertension Father   ?? Glaucoma Father   ?? Asthma Brother   ?? Diabetes Maternal Grandmother   ? ? ?Allergies  ?Allergen Reactions  ?? Nexium [Esomeprazole Magnesium] Nausea And Vomiting  ? ? ? ?Current Medications:  ? ?Current Outpatient Medications:  ??  hydrOXYzine (ATARAX) 25 MG tablet, Take 1 tablet (25 mg total) by mouth every 8 (eight) hours as needed for anxiety., Disp: 30 tablet, Rfl: 3 ??  norethindrone-ethinyl estradiol (LOESTRIN) 1-20 MG-MCG tablet, Take 1 tablet by mouth daily., Disp: 84 tablet, Rfl: 3 ??  Prenatal Vit-Fe Fumarate-FA (PRENATAL MULTIVITAMIN) TABS tablet, Take 1 tablet by mouth daily at 12 noon., Disp: , Rfl:   ? ?Review of Systems:  ? ?ROS ?Negative unless otherwise specified per HPI. ?Vitals:  ? ?Vitals:  ? 11/02/21 1013  ?BP: 116/70  ?Pulse: 93  ?Temp: 98.2 ?F (36.8 ?C)  ?TempSrc: Temporal  ?SpO2: 98%  ?Weight: 179 lb 6.1 oz (81.4 kg)  ?Height: 5\' 6"  (1.676 m)  ?   ? ?Body mass index is 28.95 kg/m?. ? ?Physical Exam:  ? ?Physical Exam ?Vitals and nursing note reviewed.  ?Constitutional:   ?   General: She is not in acute distress. ?   Appearance: She is well-developed. She is not ill-appearing or toxic-appearing.  ?Cardiovascular:  ?   Rate and Rhythm: Normal rate and regular rhythm.  ?   Pulses: Normal pulses.  ?   Heart sounds: Normal heart sounds, S1 normal and S2 normal.  ?Pulmonary:  ?   Effort: Pulmonary effort is normal.  ?   Breath sounds: Normal breath sounds.  ?Abdominal:  ?   General: Abdomen is flat. Bowel sounds are normal.  ?   Palpations: Abdomen is soft.  ?   Tenderness: There is abdominal tenderness in  the epigastric area.  ?Skin: ?   General: Skin is warm and dry.  ?Neurological:  ?   Mental Status: She is alert.  ?   GCS: GCS eye subscore is 4. GCS verbal subscore is 5. GCS motor subscore is 6.  ?Psychiatric:     ?   Speech: Speech normal.     ?   Behavior: Behavior normal. Behavior is cooperative.  ? ? ?Assessment and Plan:  ? ?Chest pain, unspecified type; Epigastric pain ?EKG tracing is personally reviewed.  EKG notes NSR.  No acute changes.  ?Suspect possible gastritis  ?Exam and history reassuring however, due to current 2 months post-partum status, will obtain D-Dimer to further evaluate and  reduce suspicion for PE ?Start protonix 40 mg daily -- cautioned that this medication is similar to nexium and I cannot guarantee that it will not cause her vomiting like it did with the nexium ?Update blood work today  ?As long as work-up negative, may proceed with starting with SSRI of choice ?Follow up if new/worsening symptoms or concerns occur ? ?I,Havlyn C Ratchford,acting as a scribe for [employer], PA.,have documented all relevant documentation on the behalf of Inda Coke, PA,as directed by  Inda Coke, PA while in the presence of Inda Coke, [place]. ? ?IInda Coke, PA, have reviewed all documentation for this visit. The documentation on 11/02/21 for the exam, diagnosis, procedures, and orders are all accurate and complete. ? ?Inda Coke, PA-C ? ? ?

## 2021-11-02 NOTE — Patient Instructions (Signed)
It was great to see you! ? ?Blood work today ? ?Start daily protonix to help with possible gastritis ? ?I will be in touch with results and recommendations ? ?If new symptoms -- let us know ? ?Take care, ? ?Inda Coke PA-C  ?

## 2021-11-02 NOTE — ED Provider Notes (Signed)
?[facility] EMERGENCY DEPT ?Provider Note ? ? ?CSN: [id number] ?Arrival date & time: 11/02/21  1737 ? ?  ? ?History ? ?Chief Complaint  ?Patient presents with  ? Chest Pain  ? ? ?Jennifer Nelson is a 26 y.o. female. ? ?HPI ?Patient is a 26 year old female who is 2 months postpartum and presents to the emergency department at the request of her PCP due to chest pain.  Patient states that she has been having chest pain for many years which is been worsening over the past 3 weeks.  Her symptoms are intermittent.  No modifying factors.  States that they are central and nonradiating.  States her pain is dull and sometimes is pressure-like.  Denies any shortness of breath, nausea, vomiting.  States that her pain is typically worse at night and sometimes will even wake her up due to the intensity.  She notes a history of GERD but states that her symptoms are different than her prior GERD symptoms.  Denies any abdominal pain. ?  ? ?Home Medications ?Prior to Admission medications   ?Medication Sig Start Date End Date Taking? Authorizing Provider  ?hydrOXYzine (ATARAX) 25 MG tablet Take 1 tablet (25 mg total) by mouth every 8 (eight) hours as needed for anxiety. 09/15/21   Tresea Mall, CNM  ?norethindrone-ethinyl estradiol (LOESTRIN) 1-20 MG-MCG tablet Take 1 tablet by mouth daily. 09/27/21   Nadara Mustard, MD  ?pantoprazole (PROTONIX) 40 MG tablet Take 1 tablet (40 mg total) by mouth daily. 11/02/21   Jarold Motto, PA  ?Prenatal Vit-Fe Fumarate-FA (PRENATAL MULTIVITAMIN) TABS tablet Take 1 tablet by mouth daily at 12 noon.    [provider]  ?   ? ?Allergies    ?Nexium [esomeprazole magnesium]   ? ?Review of Systems   ?Review of Systems  ?All other systems reviewed and are negative. ?Ten systems reviewed and are negative for acute change, except as noted in the HPI.   ?Physical Exam ?Updated Vital Signs ?BP 121/77   Pulse 98   Temp 98.2 ?F (36.8 ?C)   Resp 20   LMP 11/02/2021 (Exact  Date)   SpO2 100%  ?Physical Exam ?Vitals and nursing note reviewed.  ?Constitutional:   ?   General: She is not in acute distress. ?   Appearance: Normal appearance. She is well-developed. She is not ill-appearing, toxic-appearing or diaphoretic.  ?HENT:  ?   Head: Normocephalic and atraumatic.  ?   Right Ear: External ear normal.  ?   Left Ear: External ear normal.  ?   Nose: Nose normal.  ?   Mouth/Throat:  ?   Mouth: Mucous membranes are moist.  ?   Pharynx: Oropharynx is clear. No oropharyngeal exudate or posterior oropharyngeal erythema.  ?Eyes:  ?   Extraocular Movements: Extraocular movements intact.  ?Cardiovascular:  ?   Rate and Rhythm: Normal rate and regular rhythm.  ?   Pulses: Normal pulses.     ?     Radial pulses are 2+ on the right side and 2+ on the left side.  ?     Dorsalis pedis pulses are 2+ on the right side and 2+ on the left side.  ?   Heart sounds: Normal heart sounds. Heart sounds not distant. No murmur heard. ?No systolic murmur is present.  ?No diastolic murmur is present.  ?  No friction rub. No gallop. No S3 or S4 sounds.  ?Pulmonary:  ?   Effort: Pulmonary effort is normal. No tachypnea, accessory muscle  usage or respiratory distress.  ?   Breath sounds: Normal breath sounds. No stridor. No decreased breath sounds, wheezing, rhonchi or rales.  ?Chest:  ?   Chest wall: Tenderness present.  ?   Comments: Mild TTP noted overlying the sternum. ?Abdominal:  ?   General: Abdomen is flat.  ?   Palpations: Abdomen is soft.  ?   Tenderness: There is no abdominal tenderness.  ?   Comments: Abdomen is soft and nontender.  ?Musculoskeletal:     ?   General: Normal range of motion.  ?   Cervical back: Normal range of motion and neck supple. No tenderness.  ?   Right lower leg: No edema.  ?   Left lower leg: No edema.  ?   Comments: No pedal edema.  ?Skin: ?   General: Skin is warm and dry.  ?Neurological:  ?   General: No focal deficit present.  ?   Mental Status: She is alert and oriented to  person, place, and time.  ?Psychiatric:     ?   Mood and Affect: Mood normal.     ?   Behavior: Behavior normal.  ? ?ED Results / Procedures / Treatments   ?Labs ?(all labs ordered are listed, but only abnormal results are displayed) ?Labs Reviewed  ?TROPONIN I (HIGH SENSITIVITY)  ? ? ?EKG ?None ? ?Radiology ?CT Angio Chest PE W and/or Wo Contrast ? ?Result Date: 11/02/2021 ?CLINICAL DATA:  Chest pain positive D-dimer EXAM: CT ANGIOGRAPHY CHEST WITH CONTRAST TECHNIQUE: Multidetector CT imaging of the chest was performed using the standard protocol during bolus administration of intravenous contrast. Multiplanar CT image reconstructions and MIPs were obtained to evaluate the vascular anatomy. RADIATION DOSE REDUCTION: This exam was performed according to the departmental dose-optimization program which includes automated exposure control, adjustment of the mA and/or kV according to patient size and/or use of iterative reconstruction technique. CONTRAST:  61mL OMNIPAQUE IOHEXOL 350 MG/ML SOLN COMPARISON:  None. FINDINGS: Cardiovascular: Satisfactory opacification of the pulmonary arteries to the segmental level. No evidence of pulmonary embolism. Normal heart size. No pericardial effusion. Nonaneurysmal aorta. Mediastinum/Nodes: No enlarged mediastinal, hilar, or axillary lymph nodes. Thyroid gland, trachea, and esophagus demonstrate no significant findings. Lungs/Pleura: Lungs are clear. No pleural effusion or pneumothorax. Upper Abdomen: No acute abnormality. Musculoskeletal: No chest wall abnormality. No acute or significant osseous findings. Review of the MIP images confirms the above findings. IMPRESSION: 1. Negative for acute pulmonary embolus. 2. Clear lung fields Electronically Signed   By: Jasmine Pang M.D.   On: 11/02/2021 19:11   ? ?Procedures ?Procedures  ? ?Medications Ordered in ED ?Medications  ?iohexol (OMNIPAQUE) 350 MG/ML injection 100 mL (75 mLs Intravenous Contrast Given 11/02/21 1854)  ?alum & mag  hydroxide-simeth (MAALOX/MYLANTA) 200-200-20 MG/5ML suspension 30 mL (30 mLs Oral Given 11/02/21 2127)  ?  And  ?lidocaine (XYLOCAINE) 2 % viscous mouth solution 15 mL (15 mLs Oral Given 11/02/21 2127)  ? ? ?ED Course/ Medical Decision Making/ A&P ?  ?                        ?Medical Decision Making ?Amount and/or Complexity of Data Reviewed ?Radiology: ordered. ? ?Risk ?OTC drugs. ?Prescription drug management. ? ?Pt is a 26 y.o. female who presents to the emergency department due to chest pain.  Symptoms have been intermittent for many years but worsened over the past few weeks.  She is 2 months postpartum.  She saw her PCP  earlier today and had basic lab work obtained including a D-dimer which was elevated so she was sent to the emergency department for PE rule out. ? ?Labs: ?Troponin 2. ? ?Imaging: ?CTA of the chest shows "negative for acute pulmonary embolus.  Clear lung fields." ? ?I, Placido SouLogan Whitfield Dulay, PA-C, personally reviewed and evaluated these images and lab results as part of my medical decision-making. ? ?On my exam heart is regular rate and rhythm without murmurs, rubs, or gallops.  Lungs are clear to auscultation bilaterally.  Abdomen is soft and nontender.  Patient did have some mild tenderness overlying the sternum of the chest.  Given her elevated D-dimer a CTA was obtained of the chest which is reassuring.  It shows "negative for acute pulmonary embolus."  Patient had lab work obtained this morning at her PCP including a CBC, CMP, TSH, lipase, D-dimer, IBC plus ferritin.  Appears generally reassuring.  As mentioned above patient did have an elevated D-dimer.  TSH also found to be decreased at less than 0.01.  Patient A&O x3.  Speaking clearly, coherently, and in complete sentences.  She had a mild episode of tachycardia upon arrival but otherwise her vital signs have been within normal limits.  Normothermic, nontachycardic.  Does not appear tremulous.  Does not appear consistent with thyroid storm at  this time.  Basic cardiac work-up obtained in the ED which also appears reassuring.  Troponin of 2.  ECG shows normal sinus rhythm.  Given her age, reassuring work-up, reproducible chest pain, as well as he

## 2021-11-02 NOTE — ED Triage Notes (Signed)
Patient reports to the ER for chest pain. Patient reports she saw her PCP today and was told she had a + d-dimer and was sent here for eval. Patient is x2 months post partum and they were concerned about blood clots. Patient reports she had an EKG and blood work performed earlier today and was sent for CTA ?

## 2021-11-02 NOTE — Discharge Instructions (Addendum)
Please take the Protonix that you were prescribed by your PCP.  Please continue to monitor your symptoms closely and return to the emergency department if you develop any new or worsening symptoms.  Please follow-up with your PCP tomorrow morning regarding your abnormal TSH. ?

## 2021-11-02 NOTE — Telephone Encounter (Signed)
Called pt and told her D- Dimer test that checks for blood clots came back elevated and Samantha needs you to go to the ED at [facility] to be evaluated. Pt verbalized understanding and said will they know what I need? Told her yes, just tell them you got a call from your provider and your D-Dimer is elevated. They will know what to check. Pt verbalized understanding and will go. ?

## 2021-11-02 NOTE — Telephone Encounter (Signed)
Received lab results via fax and was given to me. D-Dimer is elevated at 3.52. [facility] and told her D-Dimer came back elevated at 3.52. Samantha verbalized understanding and said to call pt and tell her to go to the ED at [facility] to be evaluated. ? ?

## 2021-11-03 ENCOUNTER — Other Ambulatory Visit (INDEPENDENT_AMBULATORY_CARE_PROVIDER_SITE_OTHER): Payer: Managed Care, Other (non HMO)

## 2021-11-03 ENCOUNTER — Other Ambulatory Visit: Payer: Self-pay

## 2021-11-03 ENCOUNTER — Telehealth: Payer: Self-pay

## 2021-11-03 ENCOUNTER — Other Ambulatory Visit: Payer: Self-pay | Admitting: Physician Assistant

## 2021-11-03 DIAGNOSIS — R7989 Other specified abnormal findings of blood chemistry: Secondary | ICD-10-CM

## 2021-11-03 LAB — T4, FREE: Free T4: 2.14 ng/dL — ABNORMAL HIGH (ref 0.60–1.60)

## 2021-11-03 LAB — T3, FREE: T3, Free: 8.1 pg/mL — ABNORMAL HIGH (ref 2.3–4.2)

## 2021-11-03 NOTE — Telephone Encounter (Signed)
Patient states she was instructed to go to ER last night.  States she did not receive any answers.  States she has seen that a critical lab has came in and would like a call back ASAP.

## 2021-11-03 NOTE — Telephone Encounter (Signed)
FYI, Pt went to ED. ? ?

## 2021-11-03 NOTE — Telephone Encounter (Signed)
Chief Complaint CHEST PAIN - pain, pressure, heaviness or ?tightness ?Reason for Call Request to Speak to a Physician ?Initial Comment Caller states, was told after getting her labs, to the ?Hospital for a CT-Scan. No order received. CTScan elevated level Chest pains. ?Translation No ?No Triage Reason Other ?Nurse Assessment ?Nurse: Berle Mull RN, Sherrilyn Rist Date/Time (Eastern Time): 11/02/2021 5:34:40 PM ?Confirm and document reason for call. If ?symptomatic, describe symptoms. ?---Caller states, was told after getting her labs, to the ?Hospital for a CT-Scan. No order received. CT-Scan ?elevated level Chest pains. caller is checking into ed ?right now ?Does the patient have any new or worsening ?symptoms? ---Yes ?Will a triage be completed? ---No ?Select reason for no triage. ---Other ?Please document clinical information provided and ?list any resource used. ---pt in er already waiting to see md ?Disp. Time (Eastern ?Time) Disposition Final User ?11/02/2021 5:32:01 PM Send to Urgent Harless Litten, Gigi Gin ?11/02/2021 5:37:15 PM Clinical Call Yes Raae, RN, Mart Piggs ?

## 2021-11-03 NOTE — Addendum Note (Signed)
Addended by: Lorn Junes on: 11/03/2021 11:54 AM ? ? Modules accepted: Orders ? ?

## 2021-11-03 NOTE — Telephone Encounter (Signed)
I personally called patient and reviewed all lab results with her and answered all questions to patient's satisfaction. ? ?Jarold Motto PA-C ?

## 2021-11-26 ENCOUNTER — Other Ambulatory Visit: Payer: Self-pay | Admitting: Physician Assistant

## 2021-11-26 LAB — CBC AND DIFFERENTIAL
HCT: 37 (ref 36–46)
Hemoglobin: 12.4 (ref 12.0–16.0)
Platelets: 332 10*3/uL (ref 150–400)
WBC: 5.5

## 2021-11-26 LAB — HEPATIC FUNCTION PANEL
ALT: 14 U/L (ref 7–35)
AST: 19 (ref 13–35)
Alkaline Phosphatase: 54 (ref 25–125)
Bilirubin, Direct: 0.1 (ref 0.01–0.4)
Bilirubin, Total: 0.4

## 2021-11-26 LAB — CBC: RBC: 4.55 (ref 3.87–5.11)

## 2021-11-26 LAB — COMPREHENSIVE METABOLIC PANEL: Albumin: 4.2 (ref 3.5–5.0)

## 2021-11-30 ENCOUNTER — Other Ambulatory Visit: Payer: Self-pay | Admitting: *Deleted

## 2021-11-30 MED ORDER — PANTOPRAZOLE SODIUM 40 MG PO TBEC: 40.0000 mg | DELAYED_RELEASE_TABLET | Freq: Every day | ORAL | 0 refills | Status: DC

## 2021-12-01 ENCOUNTER — Ambulatory Visit (INDEPENDENT_AMBULATORY_CARE_PROVIDER_SITE_OTHER): Payer: Managed Care, Other (non HMO) | Admitting: Family

## 2021-12-01 ENCOUNTER — Encounter: Payer: Self-pay | Admitting: Family

## 2021-12-01 VITALS — BP 119/62 | HR 76 | Temp 99.1°F | Ht 66.0 in | Wt 177.4 lb

## 2021-12-01 DIAGNOSIS — J029 Acute pharyngitis, unspecified: Secondary | ICD-10-CM

## 2021-12-01 DIAGNOSIS — R110 Nausea: Secondary | ICD-10-CM

## 2021-12-01 DIAGNOSIS — R509 Fever, unspecified: Secondary | ICD-10-CM

## 2021-12-01 LAB — POCT INFLUENZA A/B
Influenza A, POC: NEGATIVE
Influenza B, POC: NEGATIVE

## 2021-12-01 LAB — POCT RAPID STREP A (OFFICE): Rapid Strep A Screen: NEGATIVE

## 2021-12-01 LAB — POC COVID19 BINAXNOW: SARS Coronavirus 2 Ag: NEGATIVE

## 2021-12-01 MED ORDER — ONDANSETRON 4 MG PO TBDP: 4.0000 mg | ORAL_TABLET | Freq: Three times a day (TID) | ORAL | 0 refills | Status: DC | PRN

## 2021-12-01 NOTE — Progress Notes (Signed)
? ?Subjective:  ? ? ? Patient ID: Kathlynn Grate, female    DOB: 1996-01-29, 26 y.o.   MRN: [id number] ? ?Chief Complaint  ?Patient presents with  ? Headache  ?  Pt c/o headaches,body aches, chills and ear pain bilateral since this morning. No Covid test. Has tried tylenol which didn't help.   ? Nausea  ?  Pt c/o nausea since this morning. Has not tried anything for it.   ? Sore Throat  ?  Pt c/o sore throat since this morning.   ? ?HPI: ?Upper Respiratory Infection: Symptoms include achiness, low grade fever, nasal congestion, and sore throat, chills, nausea, and ear pain.  ?Onset of symptoms was 8 hours ago, gradually worsening since that time. She has had very poor oral intake. Evaluation to date: none.  ?Treatment to date:  Tylenol . ? ? ?Assessment & Plan:  ? ?Problem List Items Addressed This Visit   ?None ?Visit Diagnoses   ? ? Fever, unspecified fever cause    -  Primary  ? Rapid tests negative, 3 mo baby also ill & seeing a provider today. Advised on up to 600mg  Ibuprofen tid for aches, sore throat and fever. Pt states she is not breast feeding. ? ?Relevant Orders  ? POC COVID-19 (Completed)  ? POCT Influenza A/B (Completed)  ? Sore throat      ? rapid strep negative. Advised on Ibuprofen as above, warm salt water gargles. ? ?Relevant Orders  ? POCT rapid strep A (Completed)  ? Nausea in adult      ? reports she can't drink water without causing nausea. Sending generic Zofran, advised on use & SE, also can try OTC ginger chews/lozenges. ? ?Relevant Medications  ? ondansetron (ZOFRAN-ODT) 4 MG disintegrating tablet  ? ?  ? ?Outpatient Medications Prior to Visit  ?Medication Sig Dispense Refill  ? atenolol (TENORMIN) 25 MG tablet Take 25 mg by mouth daily.    ? escitalopram (LEXAPRO) 10 MG tablet Take 10 mg by mouth daily.    ? norethindrone-ethinyl estradiol (LOESTRIN) 1-20 MG-MCG tablet Take 1 tablet by mouth daily. 84 tablet 3  ? pantoprazole (PROTONIX) 40 MG tablet Take 1 tablet (40 mg total) by  mouth daily. 90 tablet 0  ? Prenatal Vit-Fe Fumarate-FA (PRENATAL MULTIVITAMIN) TABS tablet Take 1 tablet by mouth daily at 12 noon.    ? hydrOXYzine (ATARAX) 25 MG tablet Take 1 tablet (25 mg total) by mouth every 8 (eight) hours as needed for anxiety. (Patient not taking: Reported on 12/01/2021) 30 tablet 3  ? ?No facility-administered medications prior to visit.  ? ? ?Past Medical History:  ?Diagnosis Date  ? Anxiety   ? Depression   ? GERD (gastroesophageal reflux disease)   ? Headache   ? Vaginal delivery   ? 2020, 2023  ? Vaginal Pap smear, abnormal   ? ? ?Past Surgical History:  ?Procedure Laterality Date  ? DENTAL SURGERY    ? ? ?Allergies  ?Allergen Reactions  ? Nexium [Esomeprazole Magnesium] Nausea And Vomiting  ? Sertraline   ? ? ?   ?Objective:  ?  ?Physical Exam ?Vitals and nursing note reviewed.  ?Constitutional:   ?   Appearance: Normal appearance.  ?HENT:  ?   Right Ear: Tympanic membrane and ear canal normal.  ?   Left Ear: Tympanic membrane and ear canal normal.  ?   Mouth/Throat:  ?   Mouth: Mucous membranes are moist.  ?   Pharynx: Posterior oropharyngeal erythema present. No  pharyngeal swelling or oropharyngeal exudate.  ?   Tonsils: No tonsillar exudate or tonsillar abscesses.  ?Cardiovascular:  ?   Rate and Rhythm: Normal rate and regular rhythm.  ?Pulmonary:  ?   Effort: Pulmonary effort is normal.  ?   Breath sounds: Normal breath sounds.  ?Musculoskeletal:     ?   General: Normal range of motion.  ?Skin: ?   General: Skin is warm and dry.  ?Neurological:  ?   Mental Status: She is alert.  ?Psychiatric:     ?   Mood and Affect: Mood normal.     ?   Behavior: Behavior normal.  ? ? ?BP 119/62 (BP Location: Left Arm, Patient Position: Sitting, Cuff Size: Large)   Pulse 76   Temp 99.1 ?F (37.3 ?C) (Temporal)   Ht 5\' 6"  (1.676 m)   Wt 177 lb 6 oz (80.5 kg)   LMP 11/02/2021 (Exact Date)   SpO2 99%   Breastfeeding No   BMI 28.63 kg/m?  ?Wt Readings from Last 3 Encounters:  ?12/01/21 177 lb  6 oz (80.5 kg)  ?11/02/21 179 lb 6.1 oz (81.4 kg)  ?09/27/21 189 lb (85.7 kg)  ? ? ?   ? ?Meds ordered this encounter  ?Medications  ? ondansetron (ZOFRAN-ODT) 4 MG disintegrating tablet  ?  Sig: Take 1 tablet (4 mg total) by mouth every 8 (eight) hours as needed for nausea or vomiting.  ?  Dispense:  20 tablet  ?  Refill:  0  ?  Order Specific Question:   Supervising Provider  ?  Answer:   ANDY, CAMILLE L [[id number]]  ? ? ?11/27/21, NP ? ?

## 2021-12-02 ENCOUNTER — Encounter: Payer: Self-pay | Admitting: Physician Assistant

## 2021-12-02 ENCOUNTER — Telehealth (INDEPENDENT_AMBULATORY_CARE_PROVIDER_SITE_OTHER): Payer: Managed Care, Other (non HMO) | Admitting: Physician Assistant

## 2021-12-02 VITALS — Ht 66.0 in | Wt 175.0 lb

## 2021-12-02 DIAGNOSIS — O905 Postpartum thyroiditis: Secondary | ICD-10-CM

## 2021-12-02 DIAGNOSIS — F418 Other specified anxiety disorders: Secondary | ICD-10-CM

## 2021-12-02 DIAGNOSIS — R079 Chest pain, unspecified: Secondary | ICD-10-CM

## 2021-12-02 DIAGNOSIS — D509 Iron deficiency anemia, unspecified: Secondary | ICD-10-CM | POA: Diagnosis not present

## 2021-12-02 DIAGNOSIS — O99345 Other mental disorders complicating the puerperium: Secondary | ICD-10-CM | POA: Diagnosis not present

## 2021-12-02 LAB — THYROID PEROXIDASE ANTIBODIES (TPO) (REFL): Thyroid Peroxidase (TPO) Ab: 365

## 2021-12-02 LAB — T4, FREE: Free T4: 1.43

## 2021-12-02 NOTE — Progress Notes (Signed)
? ?TELEPHONE ENCOUNTER ?  ?Patient verbally agreed to telephone visit and is aware that copayment and coinsurance may apply. Patient was treated using telemedicine according to accepted telemedicine protocols. ? ?Location of the patient: home ?Location of provider: home office ?Names of all persons participating in the telemedicine service and role in the encounter: Jarold Motto, PA , Gaetana Disch ? ?Subjective:  ? ?Chief Complaint  ?Patient presents with  ? Chest Pain  ?  ? ?HPI  ? ?Chest pain ?Pt said she is feeling better, chest pain is off and on now and not as severe since she is on Protonix 40 mg daily. She is tolerating this well. Denies worsening CP, SOB. ? ?Postpartum thyroiditis ?Saw Dr. Sharl Ma with [facility] and was started on atenolol 25 mg daily. Tolerating well, just started this a few days ago. Has scheduled follow-up with him in 1 month to reassess blood work. Denies any concerning sx. ? ?Postpartum Anxiety ?She is seeing psych provider. Was trialed on prozac but had adverse effect and is now back on lexapro 10 mg daily. Tolerating well. She states that her provider is actually out of network and her visits are expensive, she would like for [place] to take this over. ? ?Iron deficiency anemia ?Currently taking pre-natal and wondering if she should take any other supplements instead. Daughter just started daycare and has been sick. She is hoping to build-up her immune system so she does not get as sick. Denies severe, heavy menstrual bleeding. ? ? ? ?Patient Active Problem List  ? Diagnosis Date Noted  ? Postpartum thyroiditis 12/02/2021  ? Acute stress disorder 09/15/2021  ? History of LEEP (loop electrosurgical excision procedure) of cervix complicating pregnancy 01/28/2021  ? CIN III (cervical intraepithelial neoplasia grade III) with severe dysplasia 02/22/2019  ? Atypical glandular cells of undetermined significance (AGUS) on cervical Pap smear 11/07/2018  ? ?Social History  ? ?Tobacco Use  ?  Smoking status: Never  ?  Passive exposure: Never  ? Smokeless tobacco: Never  ?Substance Use Topics  ? Alcohol use: Yes  ?  Comment: socially  ? ? ?Current Outpatient Medications:  ?  atenolol (TENORMIN) 25 MG tablet, Take 25 mg by mouth daily., Disp: , Rfl:  ?  escitalopram (LEXAPRO) 10 MG tablet, Take 10 mg by mouth daily., Disp: , Rfl:  ?  norethindrone-ethinyl estradiol (LOESTRIN) 1-20 MG-MCG tablet, Take 1 tablet by mouth daily., Disp: 84 tablet, Rfl: 3 ?  pantoprazole (PROTONIX) 40 MG tablet, Take 1 tablet (40 mg total) by mouth daily., Disp: 90 tablet, Rfl: 0 ?Allergies  ?Allergen Reactions  ? Nexium [Esomeprazole Magnesium] Nausea And Vomiting  ? Sertraline   ? ? ?Assessment & Plan:  ? ?1. Chest pain, unspecified type  ?Improving, no red flags ?Continue protonix and atenolol ?Follow-up as needed  ?2. Postpartum thyroiditis  ?Discussed ?Mgmt per endo  ?3. Postpartum anxiety  ?Improving ?Continue atenolol 25 mg daily and lexapro 10 mg daily ?When needs refill, we will take over lexapro 10 mg daily ?Follow-up in about 3 months, sooner if concerns  ?4. Iron deficiency anemia, unspecified iron deficiency anemia type  ?Ongoing ?Recommend finishing out bottle of pre-natal MVI ?Then may switch to 325 mg ferrous sulfate three times weekly with 500 mg vit C ?May recheck blood work in 3 months or so  ? ? ?No orders of the defined types were placed in this encounter. ? ?No orders of the defined types were placed in this encounter. ? ? ?Jarold Motto, PA ?  12/02/2021 ? ?Time spent with the patient: 8 minutes, spent in obtaining information about her symptoms, reviewing her previous labs, evaluations, and treatments, counseling her about her condition (please see the discussed topics above), and developing a plan to further investigate it; she had a number of questions which I addressed.  ? ? ?

## 2022-01-05 ENCOUNTER — Encounter: Payer: Self-pay | Admitting: Physician Assistant

## 2022-01-05 MED ORDER — ESCITALOPRAM OXALATE 10 MG PO TABS: 10.0000 mg | ORAL_TABLET | Freq: Every day | ORAL | 1 refills | Status: DC

## 2022-01-27 LAB — TSH: TSH: 2.66 (ref <=5.90)

## 2022-02-09 IMAGING — US US MFM OB TRANSVAGINAL
2 series · 14 of 28 positions shown · non-contrast
Comparison: none

[Series 1: us mfm ob transvaginal · 90 acquisitions, 10 frames shown (1 of 2)]
[im 5/90]
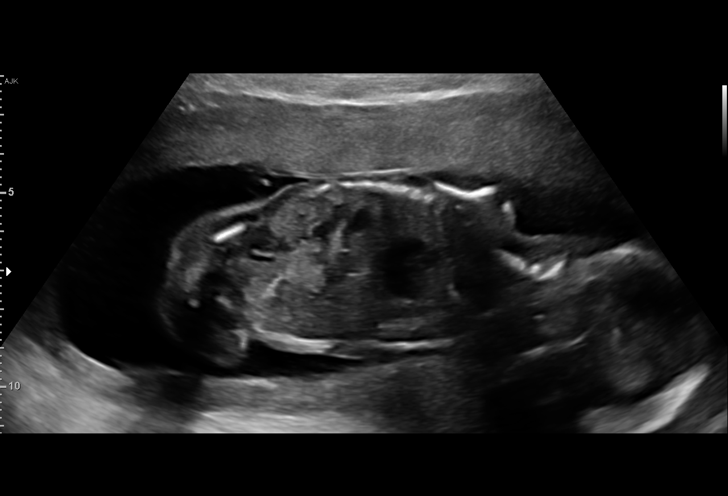
[im 15/90]
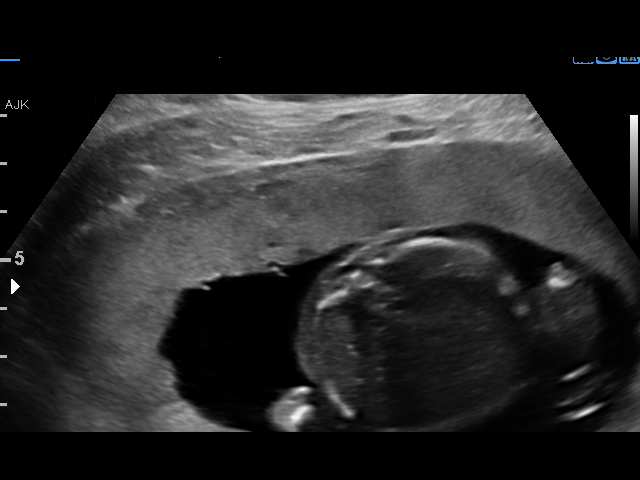
[im 24/90]
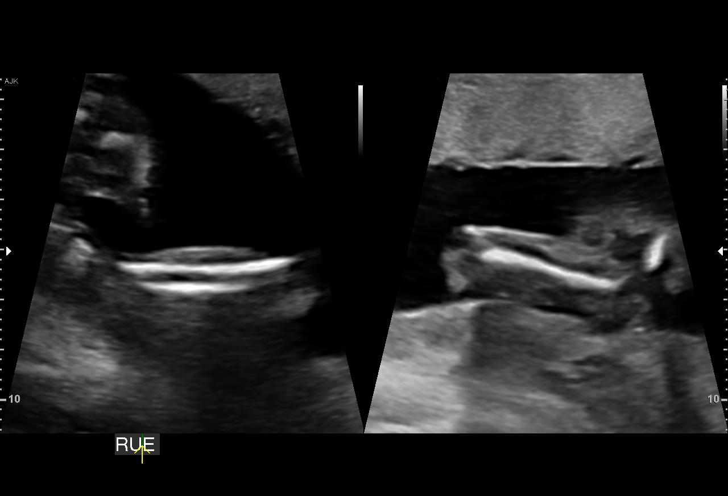
[im 33/90]
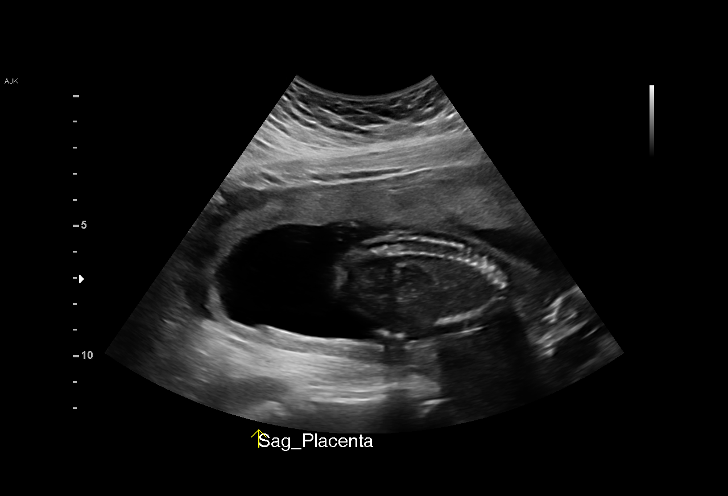
[im 43/90]
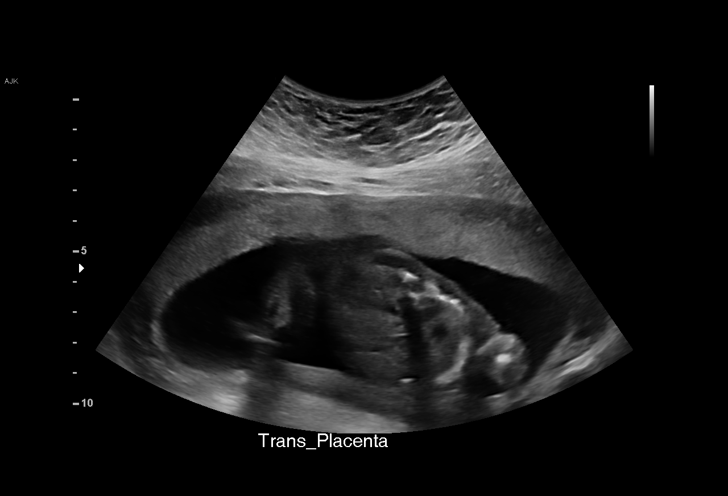
[im 52/90]
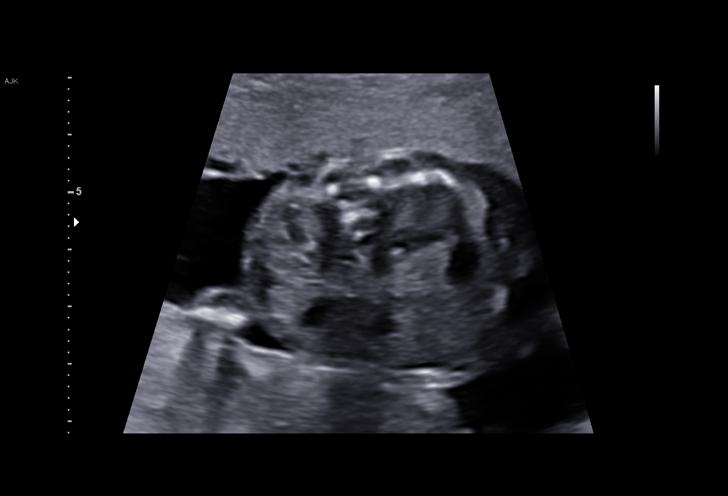
[im 61/90]
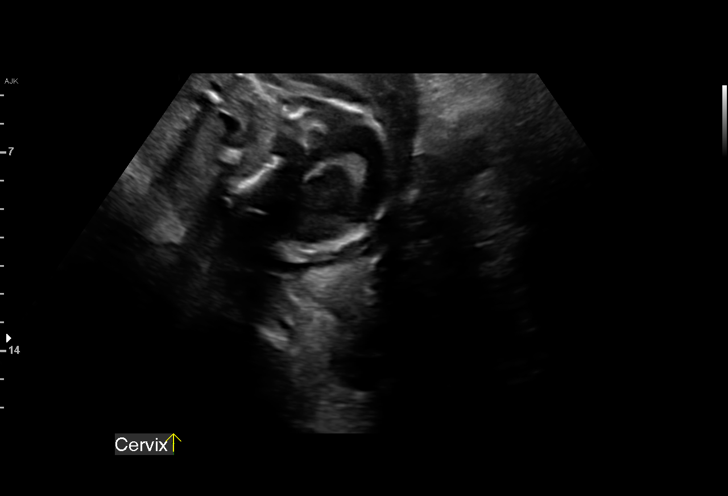
[im 71/90]
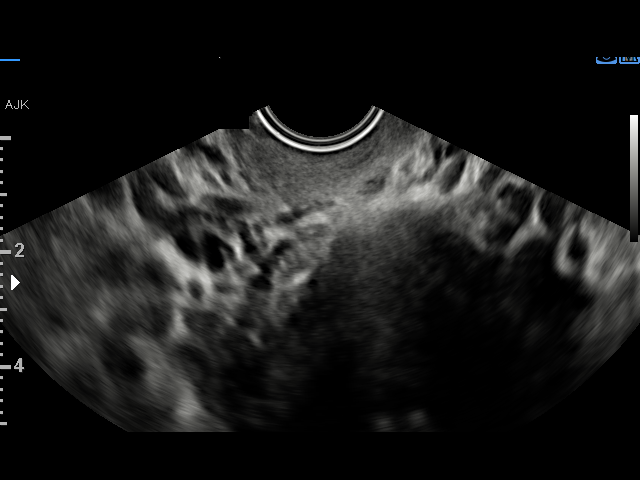
[im 80/90]
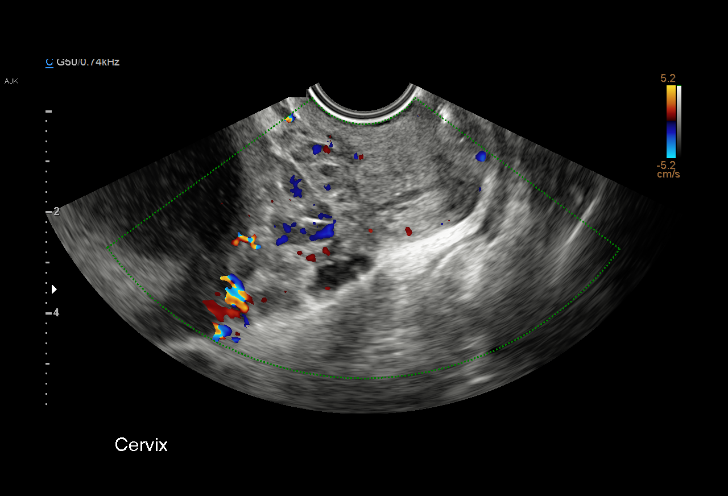
[im 90/90]
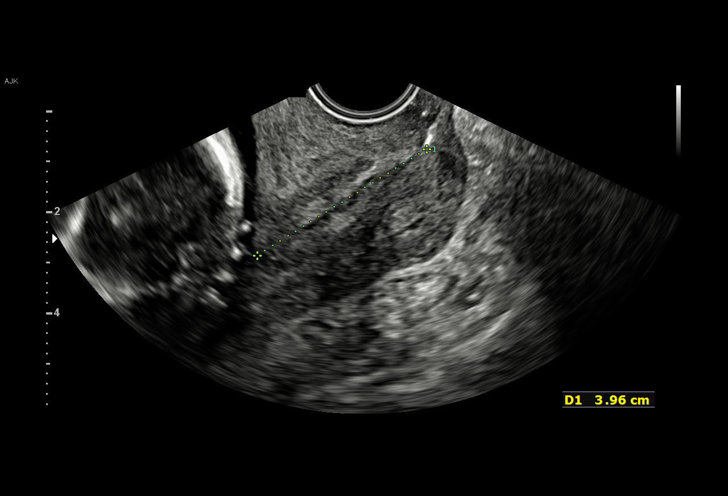

[Series 3: us mfm ob transvaginal · 4 of 38 slices shown (2 of 2)]
[im 6/38]
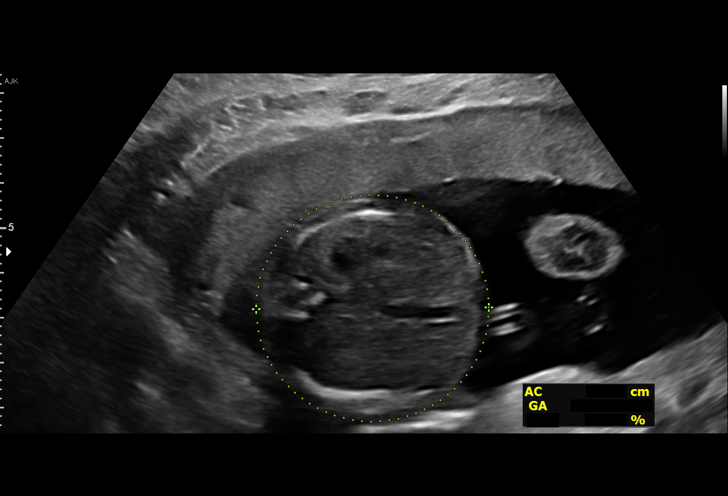
[im 16/38]
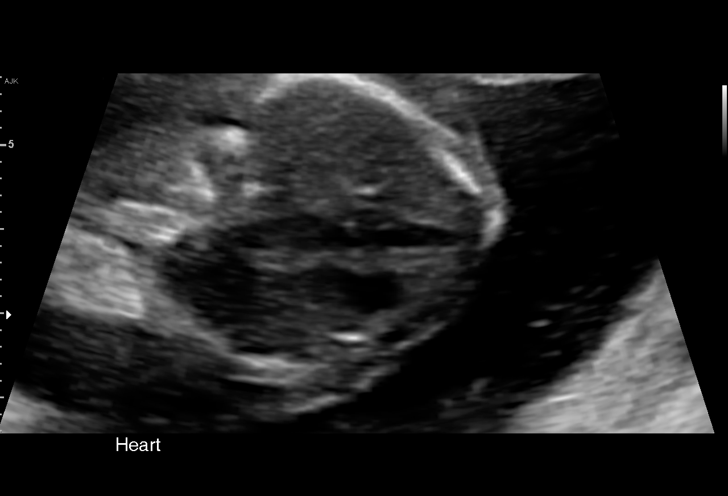
[im 27/38]
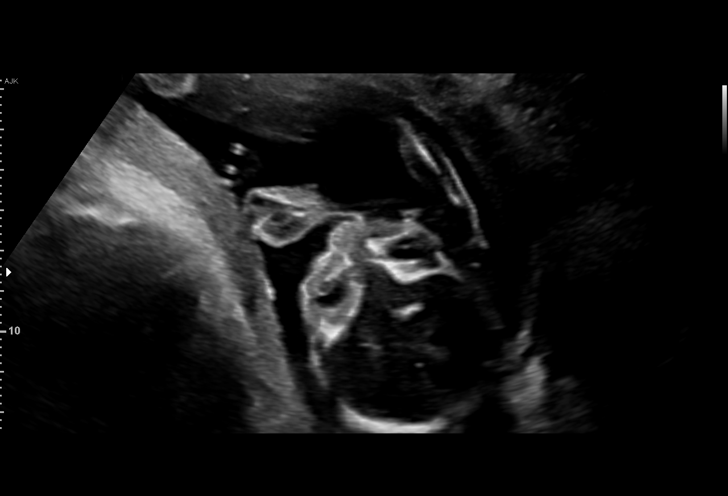
[im 38/38]
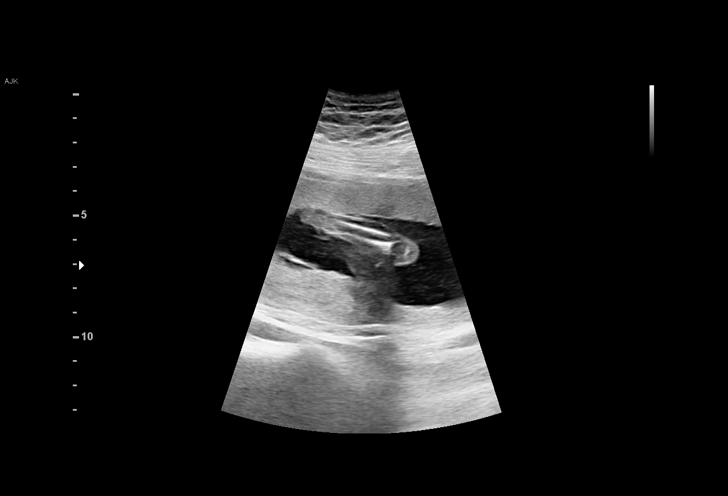

[14 of 28 positions shown; findings below may reference images not displayed]

Indications

 Encounter for antenatal screening for
 malformations
 Previous cervical surgery (LEEP [DATE]
 [facility]UMP[facility])
 Low Risk NIPS
 19 weeks gestation of pregnancy
Fetal Evaluation

 Num Of Fetuses:         1
 Fetal Heart Rate(bpm):  148
 Cardiac Activity:       Observed
 Presentation:           Cephalic

 Amniotic Fluid
 AFI FV:      Within normal limits

                             Largest Pocket(cm)

Biometry

 AC:      158.5  mm     G. Age:  21w 0d         91  %
 FL:       28.2  mm     G. Age:  18w 4d         21  %    FL/AC:      17.8   %    20 - 24
 HUM:        27  mm     G. Age:  18w 4d         32  %

 Est. FW:     323  gm    0 lb 11 oz      83  %
OB History

 Gravidity:    2         Term:   1        Prem:   0        SAB:   0
 TOP:          0       Ectopic:  0        Living: 0
Gestational Age

 LMP:           19w 2d        Date:  11/10/20                 EDD:   08/17/21
 U/S Today:     19w 6d                                        EDD:   08/13/21
 Best:          19w 2d     Det. By:  LMP  (11/10/20)          EDD:   08/17/21
Anatomy

 Cranium:               Appears normal         LVOT:                   Appears normal
 Cavum:                 Not well visualized    Aortic Arch:            Appears normal
 Ventricles:            Not well visualized    Ductal Arch:            Appears normal
 Choroid Plexus:        Appears normal         Diaphragm:              Appears normal
 Cerebellum:            Not well visualized    Stomach:                Appears normal, left
                                                                       sided
 Posterior Fossa:       Not well visualized    Abdomen:                Appears normal
 Nuchal Fold:           Not well visualized    Abdominal Wall:         Appears nml (cord
                                                                       insert, abd wall)
 Face:                  Orbits nl; profile not Cord Vessels:           Appears normal (3
                        well visualized                                vessel cord)
 Lips:                  Appears normal         Kidneys:                Appear normal
 Palate:                Not well visualized    Bladder:                Appears normal
 Thoracic:              Appears normal         Spine:                  Appears normal
 Heart:                 Not well visualized    Upper Extremities:      Appears normal
 RVOT:                  Not well visualized    Lower Extremities:      Appears normal

 Other:  Fetus appears to be female. Heels visualized. Nasal bone visualized.
         Technically difficult due to fetal position.
Cervix Uterus Adnexa

 Cervix
 Length:              4  cm.
 Normal appearance by transvaginal scan

 Uterus
 No abnormality visualized.

 Right Ovary
 Not visualized.

 Left Ovary
 Not visualized.

 Adnexa
 No abnormality visualized.
Impression

 Single intrauterine pregnancy here for a detailed anatomy
 with history of a LEEP.
 Normal anatomy with measurements consistent with dates
 There is good fetal movement and amniotic fluid volume
 Suboptimal views of the fetal anatomy were obtained
 secondary to fetal position.

 Transvaginal ultrasound was performed to with a CL of 4 cm.
 There was no evidence of funneling.
Recommendations

 Follow up growth and anatomy was scheduled in 4 weeks.

## 2022-02-14 ENCOUNTER — Encounter: Payer: Self-pay | Admitting: Physician Assistant

## 2022-02-15 ENCOUNTER — Other Ambulatory Visit: Payer: Self-pay | Admitting: *Deleted

## 2022-02-15 MED ORDER — ESCITALOPRAM OXALATE 10 MG PO TABS: 10.0000 mg | ORAL_TABLET | Freq: Every day | ORAL | 0 refills | Status: DC

## 2022-02-15 MED ORDER — ESCITALOPRAM OXALATE 10 MG PO TABS: 10.0000 mg | ORAL_TABLET | Freq: Every day | ORAL | 1 refills | Status: DC

## 2022-03-03 ENCOUNTER — Other Ambulatory Visit: Payer: Self-pay | Admitting: Physician Assistant

## 2022-03-09 IMAGING — US US MFM OB FOLLOW-UP
1 series · 14 of 28 positions shown · non-contrast
Comparison: none

[Series 1: us mfm ob follow-up · 54 acquisitions, 14 frames shown]
[im 2/54]
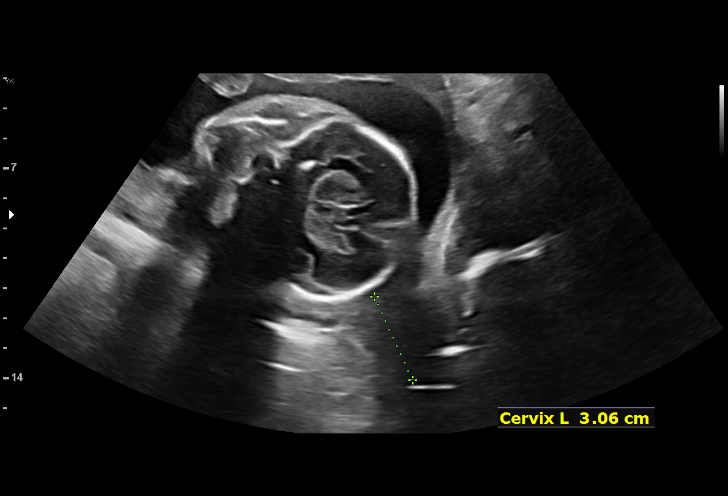
[im 6/54]
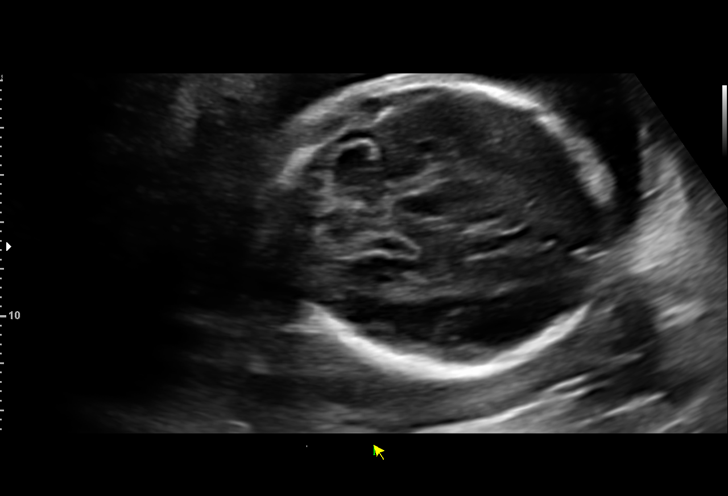
[im 10/54]
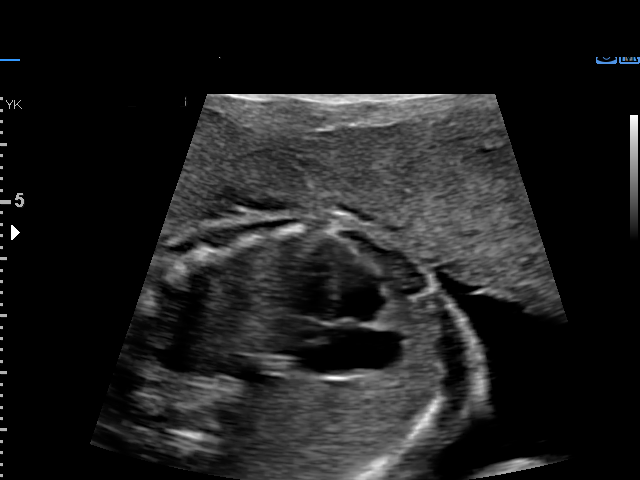
[im 14/54]
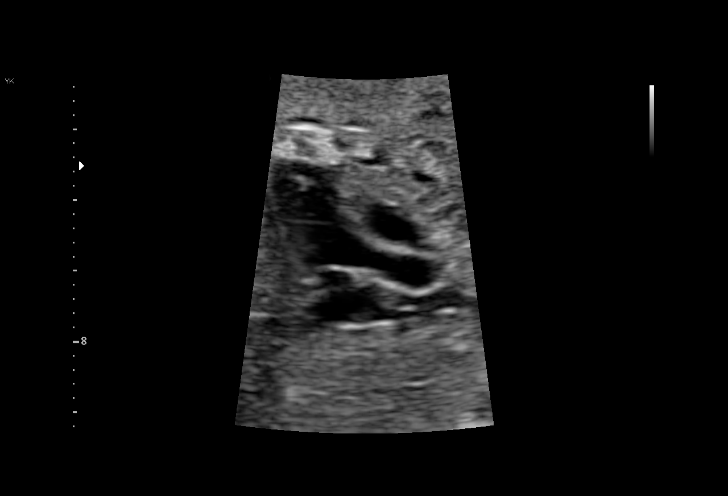
[im 18/54]
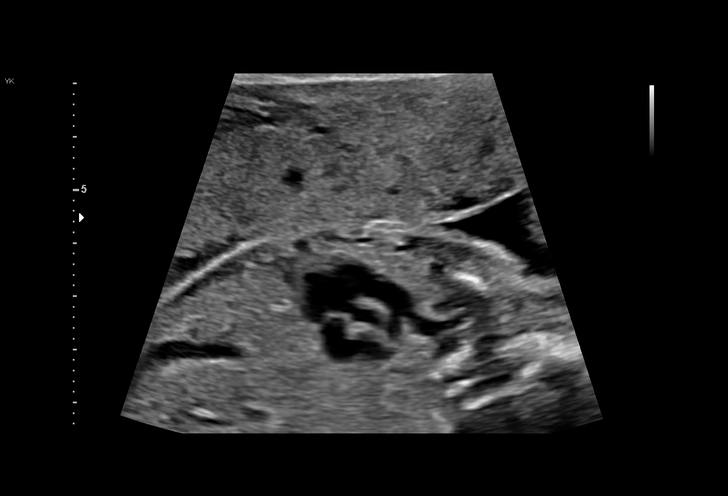
[im 22/54]
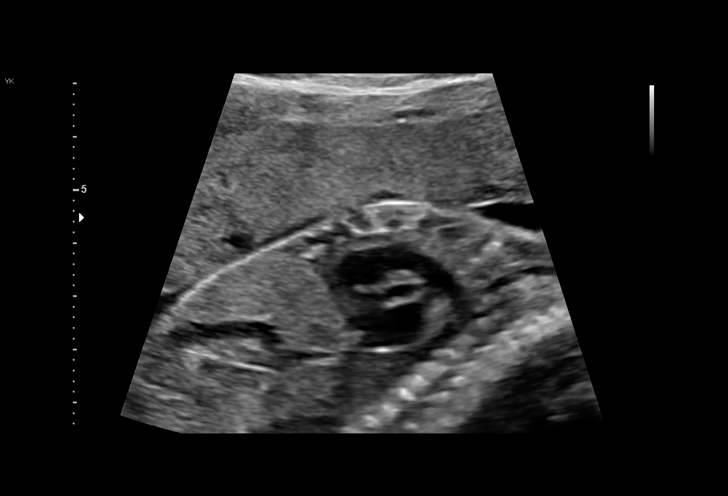
[im 26/54]
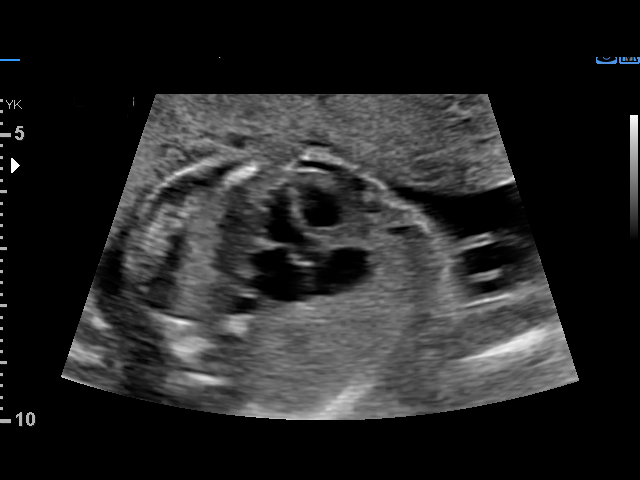
[im 30/54]
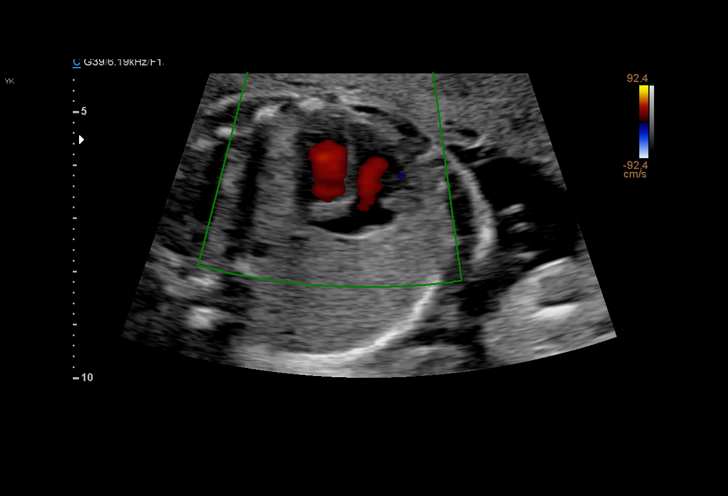
[im 34/54]
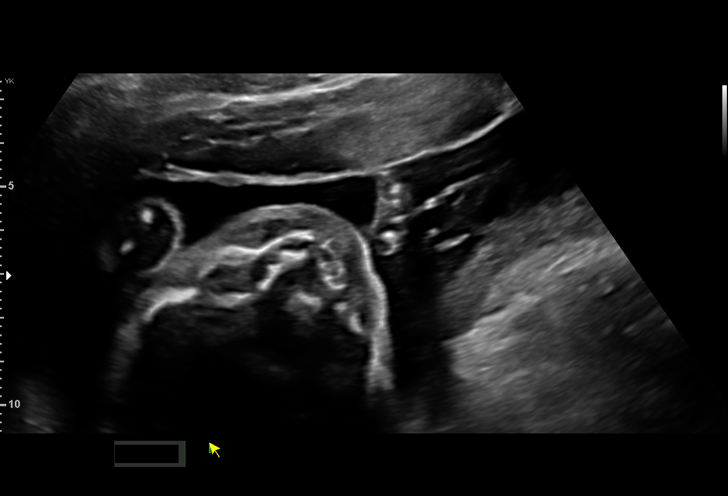
[im 38/54]
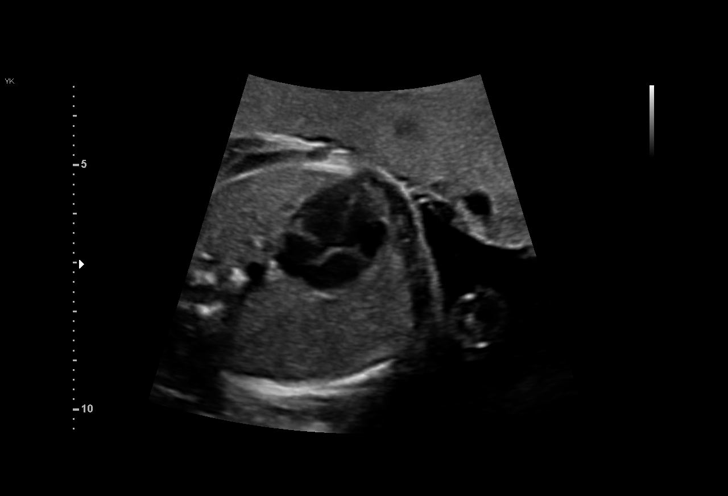
[im 42/54]
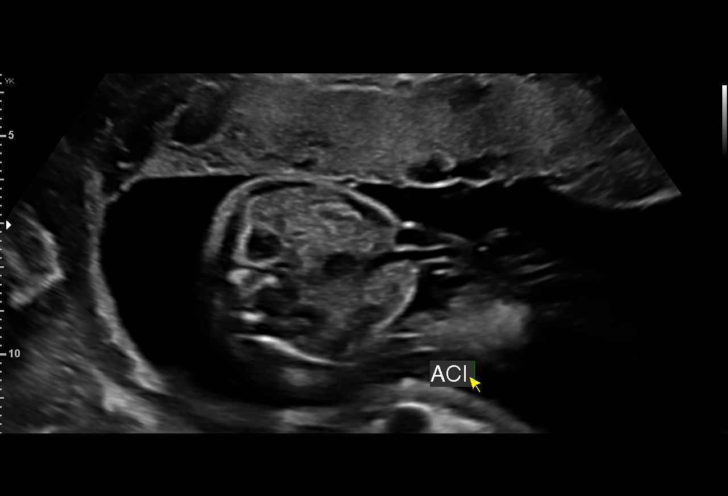
[im 46/54]
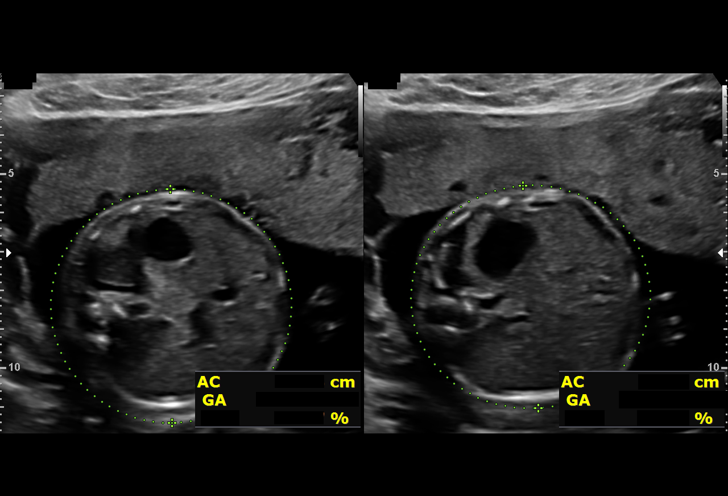
[im 50/54]
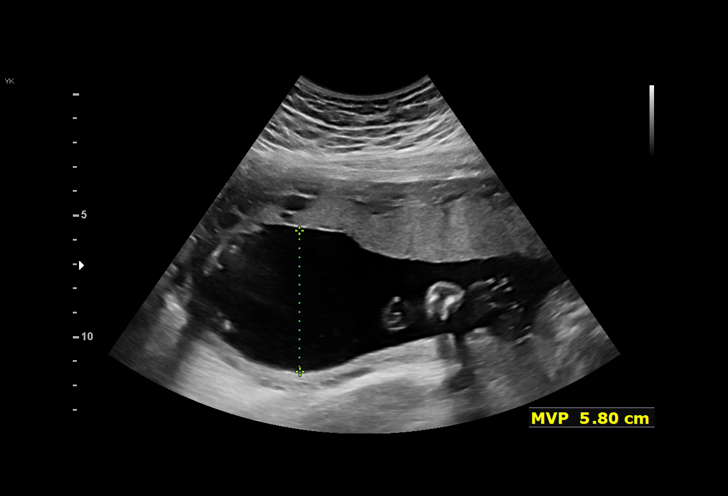
[im 54/54]
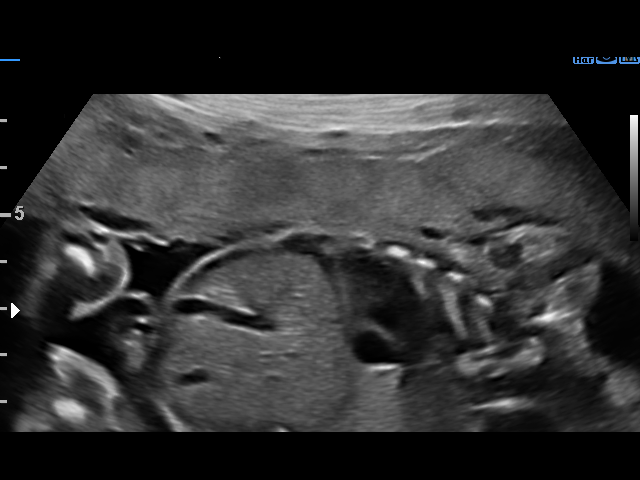

[14 of 28 positions shown; findings below may reference images not displayed]

RTOYOTA

Indications

 Encounter for antenatal screening for
 malformations
 Previous cervical surgery (LEEP [DATE]
 [facility]JANA [facility]KHAR)
 Low Risk NIPS
 Antenatal follow-up for nonvisualized fetal
 anatomy
 23 weeks gestation of pregnancy
Fetal Evaluation

 Num Of Fetuses:         1
 Fetal Heart Rate(bpm):  149
 Cardiac Activity:       Observed
 Presentation:           Cephalic
 Placenta:               Anterior
 P. Cord Insertion:      Visualized, central

 Amniotic Fluid
 AFI FV:      Within normal limits

                             Largest Pocket(cm)

Biometry
 BPD:      59.8  mm     G. Age:  24w 3d         83  %    CI:        72.16   %    70 - 86
                                                         FL/HC:      17.9   %    19.2 -
 HC:       224   mm     G. Age:  24w 3d         78  %    HC/AC:      1.18        1.05 -
 AC:       190   mm     G. Age:  23w 5d         57  %    FL/BPD:     67.1   %    71 - 87
 FL:       40.1  mm     G. Age:  23w 0d         27  %    FL/AC:      21.1   %    20 - 24

 Est. FW:     605  gm      1 lb 5 oz     55  %
OB History

 Gravidity:    2         Term:   1        Prem:   0        SAB:   0
 TOP:          0       Ectopic:  0        Living: 0
Gestational Age

 LMP:           23w 2d        Date:  11/10/20                 EDD:   08/17/21
 U/S Today:     23w 6d                                        EDD:   08/13/21
 Best:          23w 2d     Det. By:  LMP  (11/10/20)          EDD:   08/17/21
Anatomy

 Cranium:               Appears normal         LVOT:                   Appears normal
 Cavum:                 Appears normal         Aortic Arch:            Appears normal
 Ventricles:            Appears normal         Ductal Arch:            Appears normal
 Choroid Plexus:        Previously seen        Diaphragm:              Appears normal
 Cerebellum:            Appears normal         Stomach:                Appears normal, left
                                                                       sided
 Posterior Fossa:       Appears normal         Abdomen:                Appears normal
 Nuchal Fold:           Not applicable (>20    Abdominal Wall:         Appears nml (cord
                        wks GA)                                        insert, abd wall)
 Face:                  Appears normal         Cord Vessels:           Appears normal (3
                        (orbits and profile)                           vessel cord)
 Lips:                  Appears normal         Kidneys:                Previously seen
 Palate:                Appears normal         Bladder:                Appears normal
 Thoracic:              Appears normal         Spine:                  Previously seen
 Heart:                 Appears normal         Upper Extremities:      Previously seen
                        (4CH, axis, and
                        situs)
 RVOT:                  Appears normal         Lower Extremities:      Previously seen

 Other:  Fetus appears to be female. Heels visualized. Nasal bone visualized.
         Technically difficult due to fetal position.
Cervix Uterus Adnexa

 Cervix
 Length:           3.06  cm.
 Normal appearance by transabdominal scan.

 Uterus
 No abnormality visualized.
Impression

 Patient returned for completion of fetal anatomy .Amniotic
 fluid is normal and good fetal activity is seen .Fetal biometry
 is consistent with her previously-established dates .Fetal
 anatomical survey was completed and appears normal.
 On transabdominal scan, the cervix looks long and closed .
Recommendations

 Follow-up scans as clinically indicated.
                 Onacram, Don Lolito

## 2022-03-14 ENCOUNTER — Ambulatory Visit (INDEPENDENT_AMBULATORY_CARE_PROVIDER_SITE_OTHER): Payer: Managed Care, Other (non HMO) | Admitting: Physician Assistant

## 2022-03-14 ENCOUNTER — Encounter: Payer: Self-pay | Admitting: Physician Assistant

## 2022-03-14 VITALS — BP 118/80 | HR 72 | Temp 98.1°F | Ht 66.0 in | Wt 190.5 lb

## 2022-03-14 DIAGNOSIS — O99345 Other mental disorders complicating the puerperium: Secondary | ICD-10-CM

## 2022-03-14 DIAGNOSIS — J029 Acute pharyngitis, unspecified: Secondary | ICD-10-CM | POA: Diagnosis not present

## 2022-03-14 DIAGNOSIS — F418 Other specified anxiety disorders: Secondary | ICD-10-CM

## 2022-03-14 LAB — POCT RAPID STREP A (OFFICE): Rapid Strep A Screen: NEGATIVE

## 2022-03-14 LAB — POC INFLUENZA A&B (BINAX/QUICKVUE)
Influenza A, POC: NEGATIVE
Influenza B, POC: NEGATIVE

## 2022-03-14 LAB — POC COVID19 BINAXNOW: SARS Coronavirus 2 Ag: NEGATIVE

## 2022-03-14 MED ORDER — ESCITALOPRAM OXALATE 10 MG PO TABS: 10.0000 mg | ORAL_TABLET | Freq: Every day | ORAL | 1 refills | Status: DC

## 2022-03-14 NOTE — Progress Notes (Signed)
Jennifer Nelson is a 26 y.o. female here for a follow up of a pre-existing problem.  History of Present Illness:   Chief Complaint  Patient presents with   Sore Throat    Pt c/o sore throat, headache and body aches since Sat. COVID last night was Neg.    HPI  URI Symptoms started on Saturday.  Both of her children have had recent upper respiratory infections without any sort of diagnoses.  Patient did a COVID test last night -- this was negative.  She is currently taking Alka-Seltzer and Tylenol for her symptoms without much improvement.  Denies: Measured fever greater than 100.5, chest pain, shortness of breath, malaise, nausea, vomiting.  She has left ear pain and sore throat today that has had most concerned.  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  Post-partum anxiety She is currently taking Lexapro 10 mg daily.  She states that sometimes she forgets to take this medication.  She denies suicidal or homicidal ideation.  She notices that if she does not take this medication her symptoms are worse.  She does feel as though this is helping her symptoms.  Since she last saw me in the office both of her parents have been diagnosed with cancer.   Past Medical History:  Diagnosis Date   Anxiety    Depression    GERD (gastroesophageal reflux disease)    Headache    Vaginal delivery    2020, 2023   Vaginal Pap smear, abnormal      Social History   Tobacco Use   Smoking status: Never    Passive exposure: Never   Smokeless tobacco: Never  Vaping Use   Vaping Use: Never used  Substance Use Topics   Alcohol use: Yes    Comment: socially   Drug use: Never    Past Surgical History:  Procedure Laterality Date   DENTAL SURGERY      Family History  Problem Relation Age of Onset   Depression Mother    Luiz Blare' disease Mother    Kidney cancer Mother     Arthritis Father    Alcohol abuse Father    Hypertension Father    Glaucoma Father    Skin cancer Father    Lung cancer Father        stage 3   Depression Sister    Mental illness Sister    Mental illness Sister    Drug abuse Sister    Depression Sister    Mental illness Brother    Depression Brother    Asthma Brother    Heart disease Maternal Grandmother    Early death Maternal Grandmother 24   Diabetes Maternal Grandmother    COPD Maternal Grandfather    Alcohol abuse Paternal Grandfather     Allergies  Allergen Reactions   Nexium [Esomeprazole Magnesium] Nausea And Vomiting   Sertraline     Current Medications:   Current Outpatient Medications:    escitalopram (LEXAPRO) 10 MG tablet, Take 1 tablet (10 mg total) by mouth daily., Disp: 90 tablet, Rfl: 0   norethindrone-ethinyl estradiol (LOESTRIN) 1-20 MG-MCG tablet, Take 1 tablet by mouth daily., Disp: 84 tablet, Rfl: 3   pantoprazole (PROTONIX) 40 MG tablet, TAKE 1 TABLET BY MOUTH EVERY DAY, Disp: 90 tablet, Rfl: 0   Review of Systems:   ROS Negative unless otherwise specified per HPI.  Vitals:   Vitals:   03/14/22 1155  BP: 118/80  Pulse: 72  Temp: 98.1 F (36.7 C)  TempSrc: Temporal  SpO2: 98%  Weight: 190 lb 8 oz (86.4 kg)  Height: 5\' 6"  (1.676 m)     Body mass index is 30.75 kg/m.  Physical Exam:   Physical Exam Vitals and nursing note reviewed.  Constitutional:      General: She is not in acute distress.    Appearance: She is well-developed. She is not ill-appearing or toxic-appearing.  HENT:     Head: Normocephalic and atraumatic.     Right Ear: Ear canal and external ear normal. A middle ear effusion is present. Tympanic membrane is not erythematous, retracted or bulging.     Left Ear: Ear canal and external ear normal. A middle ear effusion is present. Tympanic membrane is not erythematous, retracted or bulging.     Nose: Nose normal.     Right Sinus: No maxillary sinus tenderness or  frontal sinus tenderness.     Left Sinus: No maxillary sinus tenderness or frontal sinus tenderness.     Mouth/Throat:     Pharynx: Uvula midline. Pharyngeal swelling and posterior oropharyngeal erythema present.     Tonsils: 1+ on the right. 1+ on the left.  Eyes:     General: Lids are normal.     Conjunctiva/sclera: Conjunctivae normal.  Neck:     Trachea: Trachea normal.  Cardiovascular:     Rate and Rhythm: Normal rate and regular rhythm.     Heart sounds: Normal heart sounds, S1 normal and S2 normal.  Pulmonary:     Effort: Pulmonary effort is normal.     Breath sounds: Normal breath sounds. No decreased breath sounds, wheezing, rhonchi or rales.  Lymphadenopathy:     Cervical: No cervical adenopathy.  Skin:    General: Skin is warm and dry.  Neurological:     Mental Status: She is alert.  Psychiatric:        Speech: Speech normal.        Behavior: Behavior normal. Behavior is cooperative.    Results for orders placed or performed in visit on 03/14/22  POCT rapid strep A  Result Value Ref Range   Rapid Strep A Screen Negative Negative  POC Influenza A&B(BINAX/QUICKVUE)  Result Value Ref Range   Influenza A, POC Negative Negative   Influenza B, POC Negative Negative  POC COVID-19  Result Value Ref Range   SARS Coronavirus 2 Ag Negative Negative     Assessment and Plan:   Sore throat No red flags on exam All care testing is negative Suspect viral URI Recommend ibuprofen to help with symptoms Follow-up if any new or worsening symptoms, or lack of improvement  Postpartum anxiety Well-controlled per patient Lexapro 10 mg daily Work on compliance Denies SI/HI Follow-up in 6 months, sooner if concerns     03/16/22, PA-C

## 2022-03-15 ENCOUNTER — Telehealth: Payer: Managed Care, Other (non HMO) | Admitting: Physician Assistant

## 2022-03-16 ENCOUNTER — Encounter: Payer: Self-pay | Admitting: Physician Assistant

## 2022-03-16 ENCOUNTER — Ambulatory Visit (INDEPENDENT_AMBULATORY_CARE_PROVIDER_SITE_OTHER): Payer: Managed Care, Other (non HMO) | Admitting: Physician Assistant

## 2022-03-16 VITALS — BP 106/74 | HR 74 | Temp 98.0°F | Ht 66.0 in | Wt 188.0 lb

## 2022-03-16 DIAGNOSIS — H66002 Acute suppurative otitis media without spontaneous rupture of ear drum, left ear: Secondary | ICD-10-CM

## 2022-03-16 DIAGNOSIS — D069 Carcinoma in situ of cervix, unspecified: Secondary | ICD-10-CM

## 2022-03-16 MED ORDER — AMOXICILLIN 875 MG PO TABS: 875.0000 mg | ORAL_TABLET | Freq: Two times a day (BID) | ORAL | 0 refills | Status: AC

## 2022-03-16 NOTE — Progress Notes (Signed)
Jennifer Nelson is a 26 y.o. female here for a follow up of a pre-existing problem.  History of Present Illness:   Chief Complaint  Patient presents with   Sore Throat    Pt states she is feeling worse and left ear pain.    HPI  Ear Pain Patient was seen by me on 8/21 for viral illness.  She states that since that time she is started to feel worse.  She is having significant left ear pain and worsening sore throat.  Denies fever, chills, nausea, vomiting.  CIN III (cervical intraepithelial neoplasia grade III) with severe dysplasia Her OB/GYN has moved and she now needs a new one.  She is asking for recommendation.   Past Medical History:  Diagnosis Date   Anxiety    Depression    GERD (gastroesophageal reflux disease)    Headache    Vaginal delivery    2020, 2023   Vaginal Pap smear, abnormal      Social History   Tobacco Use   Smoking status: Never    Passive exposure: Never   Smokeless tobacco: Never  Vaping Use   Vaping Use: Never used  Substance Use Topics   Alcohol use: Yes    Comment: socially   Drug use: Never    Past Surgical History:  Procedure Laterality Date   DENTAL SURGERY      Family History  Problem Relation Age of Onset   Depression Mother    Luiz Blare' disease Mother    Kidney cancer Mother    Arthritis Father    Alcohol abuse Father    Hypertension Father    Glaucoma Father    Skin cancer Father    Lung cancer Father        stage 3   Depression Sister    Mental illness Sister    Mental illness Sister    Drug abuse Sister    Depression Sister    Mental illness Brother    Depression Brother    Asthma Brother    Heart disease Maternal Grandmother    Early death Maternal Grandmother 72   Diabetes Maternal Grandmother    COPD Maternal Grandfather    Alcohol abuse Paternal Grandfather     Allergies  Allergen Reactions   Nexium [Esomeprazole Magnesium] Nausea And Vomiting   Sertraline     Current Medications:    Current Outpatient Medications:    atenolol (TENORMIN) 25 MG tablet, Take 25 mg by mouth daily., Disp: , Rfl:    escitalopram (LEXAPRO) 10 MG tablet, Take 1 tablet (10 mg total) by mouth daily., Disp: 90 tablet, Rfl: 1   norethindrone-ethinyl estradiol (LOESTRIN) 1-20 MG-MCG tablet, Take 1 tablet by mouth daily., Disp: 84 tablet, Rfl: 3   pantoprazole (PROTONIX) 40 MG tablet, TAKE 1 TABLET BY MOUTH EVERY DAY, Disp: 90 tablet, Rfl: 0   Review of Systems:   ROS Negative unless otherwise specified per HPI.  Vitals:   Vitals:   03/16/22 1120  BP: 106/74  Pulse: 74  Temp: 98 F (36.7 C)  TempSrc: Temporal  SpO2: 98%  Weight: 188 lb (85.3 kg)  Height: 5\' 6"  (1.676 m)     Body mass index is 30.34 kg/m.  Physical Exam:   Physical Exam Vitals and nursing note reviewed.  Constitutional:      General: She is not in acute distress.    Appearance: She is well-developed. She is not ill-appearing or toxic-appearing.  HENT:     Head: Normocephalic and  atraumatic.     Right Ear: Tympanic membrane, ear canal and external ear normal. Tympanic membrane is not erythematous, retracted or bulging.     Left Ear: Ear canal and external ear normal. A middle ear effusion is present. Tympanic membrane is erythematous and bulging. Tympanic membrane is not retracted.     Nose: Nose normal.     Right Sinus: No maxillary sinus tenderness or frontal sinus tenderness.     Left Sinus: No maxillary sinus tenderness or frontal sinus tenderness.     Mouth/Throat:     Pharynx: Uvula midline. Posterior oropharyngeal erythema present.     Tonsils: 1+ on the right. 1+ on the left.  Eyes:     General: Lids are normal.     Conjunctiva/sclera: Conjunctivae normal.  Neck:     Trachea: Trachea normal.  Cardiovascular:     Rate and Rhythm: Normal rate and regular rhythm.     Pulses: Normal pulses.     Heart sounds: Normal heart sounds, S1 normal and S2 normal.  Pulmonary:     Effort: Pulmonary effort is  normal.     Breath sounds: Normal breath sounds. No decreased breath sounds, wheezing, rhonchi or rales.  Lymphadenopathy:     Cervical: No cervical adenopathy.  Skin:    General: Skin is warm and dry.  Neurological:     Mental Status: She is alert.     GCS: GCS eye subscore is 4. GCS verbal subscore is 5. GCS motor subscore is 6.  Psychiatric:        Speech: Speech normal.        Behavior: Behavior normal. Behavior is cooperative.     Assessment and Plan:   Non-recurrent acute suppurative otitis media of left ear without spontaneous rupture of tympanic membrane No red flags on exam We will treat her ear infection with amoxicillin 875 mg twice daily x10 days NSAIDs for pain and swelling of throat Follow for worsening symptoms  CIN III (cervical intraepithelial neoplasia grade III) with severe dysplasia Referral to gynecology for management of her gynecological issues    Jarold Motto, PA-C

## 2022-03-24 ENCOUNTER — Telehealth: Payer: Self-pay | Admitting: *Deleted

## 2022-03-24 NOTE — Telephone Encounter (Signed)
Jennifer Nelson with Medical Records at [facility] left detailed message on her voicemail. There are two abnormal paps in her chart 10/23/18, 02/22/19 that is all we have if you need anything more you will need to contact pt. Any questions you can call me.

## 2022-04-01 LAB — TSH: TSH: 2.5 (ref <=5.90)

## 2022-04-06 ENCOUNTER — Encounter: Payer: Self-pay | Admitting: Physician Assistant

## 2022-04-27 ENCOUNTER — Encounter: Payer: Self-pay | Admitting: Family

## 2022-04-27 ENCOUNTER — Ambulatory Visit: Payer: Managed Care, Other (non HMO) | Admitting: Family

## 2022-04-27 VITALS — BP 98/64 | HR 94 | Temp 97.6°F | Ht 66.0 in | Wt 191.6 lb

## 2022-04-27 DIAGNOSIS — K648 Other hemorrhoids: Secondary | ICD-10-CM | POA: Diagnosis not present

## 2022-04-27 DIAGNOSIS — Z23 Encounter for immunization: Secondary | ICD-10-CM | POA: Diagnosis not present

## 2022-04-27 MED ORDER — HYDROCORTISONE ACETATE 25 MG RE SUPP: 25.0000 mg | Freq: Two times a day (BID) | RECTAL | 0 refills | Status: DC

## 2022-04-27 NOTE — Progress Notes (Signed)
Patient ID: Jennifer Nelson, female    DOB: [birth year], 26 y.o.   MRN: [id number]  Chief Complaint  Patient presents with   Rectal Pain    Pt c/o rectal pain since January when she gave birth. Has tried a cream that was prescribed in 2020 which does not help it. Pt does not know the name of the cream.     HPI:      Rectal pain:  Pt c/o rectal pain since January when she gave birth, and was not told she had any hemorrhoids at the time, but does remember having after her first child as well. She has tried a cream that was prescribed in 2020 which does not help it. Pt does not know the name of the cream, but also has tried Preparation H cream with little relief.  Assessment & Plan:  1. Need for immunization against influenza  - Flu Vaccine QUAD 6+ mos PF IM (Fluarix Quad PF)  2. Internal hemorrhoid sending suppositories, if too expensive, advised to get Prep H OTC, use bid x 1 week, continue to use Prep H cream for itching, must get on high fiber diet, advised on supplements, increase water intake & exercise daily, goal is 1 soft BM qd.  - hydrocortisone (ANUSOL-HC) 25 MG suppository; Place 1 suppository (25 mg total) rectally 2 (two) times daily.  Dispense: 12 suppository; Refill: 0  Subjective:    Outpatient Medications Prior to Visit  Medication Sig Dispense Refill   escitalopram (LEXAPRO) 10 MG tablet Take 1 tablet (10 mg total) by mouth daily. 90 tablet 1   levothyroxine (SYNTHROID) 112 MCG tablet Take 112 mcg by mouth every morning.     norethindrone-ethinyl estradiol (LOESTRIN) 1-20 MG-MCG tablet Take 1 tablet by mouth daily. 84 tablet 3   pantoprazole (PROTONIX) 40 MG tablet TAKE 1 TABLET BY MOUTH EVERY DAY 90 tablet 0   atenolol (TENORMIN) 25 MG tablet Take 25 mg by mouth daily. (Patient not taking: Reported on 04/27/2022)     No facility-administered medications prior to visit.   Past Medical History:  Diagnosis Date   Anxiety    Depression    GERD  (gastroesophageal reflux disease)    Headache    Vaginal delivery    2020, 2023   Vaginal Pap smear, abnormal    Past Surgical History:  Procedure Laterality Date   DENTAL SURGERY     Allergies  Allergen Reactions   Nexium [Esomeprazole Magnesium] Nausea And Vomiting   Sertraline       Objective:    Physical Exam Vitals and nursing note reviewed.  Constitutional:      Appearance: Normal appearance.  Cardiovascular:     Rate and Rhythm: Normal rate and regular rhythm.  Pulmonary:     Effort: Pulmonary effort is normal.     Breath sounds: Normal breath sounds.  Genitourinary:    Comments: Internal hemorrhoid palpated in 5-6:00 region, no erythema, irritation, previous hemorrhoidal skin tag noted externally Musculoskeletal:        General: Normal range of motion.  Skin:    General: Skin is warm and dry.  Neurological:     Mental Status: She is alert.  Psychiatric:        Mood and Affect: Mood normal.        Behavior: Behavior normal.    BP 98/64 (BP Location: Left Arm, Patient Position: Sitting, Cuff Size: Large)   Pulse 94   Temp 97.6 F (36.4 C) (Temporal)   Ht 5'  6" (1.676 m)   Wt 191 lb 9.6 oz (86.9 kg)   LMP 04/13/2022 (Approximate)   SpO2 98%   Breastfeeding No   BMI 30.93 kg/m  Wt Readings from Last 3 Encounters:  04/27/22 191 lb 9.6 oz (86.9 kg)  03/16/22 188 lb (85.3 kg)  03/14/22 190 lb 8 oz (86.4 kg)       Jeanie Sewer, NP

## 2022-04-27 NOTE — Patient Instructions (Addendum)
It was very nice to see you today!   I am sending over steroid suppositories to use twice a day for a week to shrink the internal hemorrhoid.  Continue to use the Preparation H cream for itching & rectal irritation as needed and after having a BM. Look for Tucks pads to wipe with after a BM while you are having symptoms.  To prevent future hemorrhoids hydrate with at least 2 liters of water daily! Increase fiber in your diet or start a fiber supplement like Benefiber or Metamucil, or you can take a stool softener nightly or every other night, but all of these require you to drink enough water. Exercise can also be beneficial in regular bowel movements - goal is 1 soft BM daily.     PLEASE NOTE:  If you had any lab tests please let us know if you have not heard back within a few days. You may see your results on MyChart before we have a chance to review them but we will give you a call once they are reviewed by [place]. If we ordered any referrals today, please let us know if you have not heard from their office within the next week.

## 2022-05-13 ENCOUNTER — Encounter: Payer: Self-pay | Admitting: Internal Medicine

## 2022-05-13 ENCOUNTER — Ambulatory Visit (INDEPENDENT_AMBULATORY_CARE_PROVIDER_SITE_OTHER): Payer: Managed Care, Other (non HMO) | Admitting: Internal Medicine

## 2022-05-13 VITALS — BP 106/71 | HR 99 | Temp 97.9°F | Ht 66.0 in | Wt 196.8 lb

## 2022-05-13 DIAGNOSIS — H6992 Unspecified Eustachian tube disorder, left ear: Secondary | ICD-10-CM

## 2022-05-13 DIAGNOSIS — J0300 Acute streptococcal tonsillitis, unspecified: Secondary | ICD-10-CM

## 2022-05-13 DIAGNOSIS — H66002 Acute suppurative otitis media without spontaneous rupture of ear drum, left ear: Secondary | ICD-10-CM | POA: Diagnosis not present

## 2022-05-13 MED ORDER — FLUTICASONE PROPIONATE 50 MCG/ACT NA SUSP: 2.0000 | Freq: Every day | NASAL | 6 refills | Status: DC

## 2022-05-13 MED ORDER — LIDOCAINE VISCOUS HCL 2 % MT SOLN: 15.0000 mL | OROMUCOSAL | 0 refills | Status: DC | PRN

## 2022-05-13 MED ORDER — AMOXICILLIN 500 MG PO CAPS: 500.0000 mg | ORAL_CAPSULE | Freq: Two times a day (BID) | ORAL | 0 refills | Status: AC

## 2022-05-13 NOTE — Progress Notes (Signed)
[place] at [facility]:  [phone]   Routine Medical Office Visit  Patient:  Jennifer Nelson      Age: 26 y.o.       Sex:  female  Date:   05/13/2022  PCP:    Inda Coke, [place] Provider: Loralee Pacas, MD  Assessment/Plan:   Uniqua was seen today for cough, upset stomach and headache.  Acute suppurative otitis media of left ear without spontaneous rupture of tympanic membrane, recurrence not specified -     Lidocaine Viscous HCl; Use as directed 15 mLs in the mouth or throat as needed for mouth pain.  Dispense: 100 mL; Refill: 0 -     Amoxicillin; Take 1 capsule (500 mg total) by mouth 2 (two) times daily for 10 days.  Dispense: 20 capsule; Refill: 0 -     Fluticasone Propionate; Place 2 sprays into both nostrils daily.  Dispense: 16 g; Refill: 6  Dysfunction of left eustachian tube -     Lidocaine Viscous HCl; Use as directed 15 mLs in the mouth or throat as needed for mouth pain.  Dispense: 100 mL; Refill: 0 -     Amoxicillin; Take 1 capsule (500 mg total) by mouth 2 (two) times daily for 10 days.  Dispense: 20 capsule; Refill: 0 -     Fluticasone Propionate; Place 2 sprays into both nostrils daily.  Dispense: 16 g; Refill: 6  Strep tonsillitis -     Lidocaine Viscous HCl; Use as directed 15 mLs in the mouth or throat as needed for mouth pain.  Dispense: 100 mL; Refill: 0 -     Amoxicillin; Take 1 capsule (500 mg total) by mouth 2 (two) times daily for 10 days.  Dispense: 20 capsule; Refill: 0   Explained how to use simply saline to rinse her sinuses and then followed up with Flonase nasal spray down in the leftTry to get this while nasal turbinates which is quite marked compared to the right I advise she take amoxicillin.  I advise she stay away from any family members were on chemo.  Strongly suspect strep throat because her throat kept her up all night with pain and is very swollen on the left tonsil so it is hard for me to  except the negative strep test.    Today's key discussion points - also in After Visit Summary (AVS) She was encouraged to contact our office by phone or message via MyChart if she has any questions or concerns regarding our treatment plan (see AVS).  No barriers to understanding were identified We discussed red flag symptoms and signs in detail and when to call the office or go to ER if her condition worsens (see AFTER VISIT SUMMARY).. She expressed understanding.  Asked front desk to give her the on-call number for the weekend in case something happens.    Subjective:   Jennifer Nelson is a 26 y.o. female with PMH significant for: Past Medical History:  Diagnosis Date   Anxiety    Depression    GERD (gastroesophageal reflux disease)    Headache    Vaginal delivery    2020, 2023   Vaginal Pap smear, abnormal      She is presenting today with: Chief Complaint  Patient presents with   Cough    Pt states shes had sx since Monday, pt says she feels fluid in ears, says the sx that bother her the most is sore throat and  headache. Pt went to urgent care Tuesday evening and was tested for covid, strep and flu, and they all came back negative.    upset stomach   Headache     Additional physician collected history: See Assessment/Plan section for per problem updates to history (overview and a/p subsections) as reported by patient today. Mon-Tuesday awful could barely hold down food Up for hours last  night to severe pain and swelling in left throat/tonsil > right  Has meds for upset stomach.        Objective:  Physical Exam: BP 106/71 (BP Location: Left Arm, Patient Position: Sitting)   Pulse 99   Temp 97.9 F (36.6 C) (Temporal)   Ht 5\' 6"  (1.676 m)   Wt 196 lb 12.8 oz (89.3 kg)   LMP 05/13/2022 (Exact Date)   SpO2 97%   BMI 31.76 kg/m   She is a polite, friendly, and genuine person Constitutional: NAD, AAO, not ill-appearing  Neuro: alert, no focal deficit  obvious, articulate speech Psych: normal mood, behavior, thought content   Problem specific physical exam findings:  Very swollen left tonsil without exudate, very bulging left eardrum marked swelling and erythema of the nasal turbinates and mucosa only on the left frequent cough  Results:  No results found for any visits on 05/13/22.   Recent Results (from the past 2160 hour(s))  POCT rapid strep A     Status: None   Collection Time: 03/14/22 12:01 PM  Result Value Ref Range   Rapid Strep A Screen Negative Negative  POC Influenza A&B(BINAX/QUICKVUE)     Status: None   Collection Time: 03/14/22 12:02 PM  Result Value Ref Range   Influenza A, POC Negative Negative   Influenza B, POC Negative Negative  POC COVID-19     Status: None   Collection Time: 03/14/22 12:41 PM  Result Value Ref Range   SARS Coronavirus 2 Ag Negative Negative  TSH     Status: None   Collection Time: 04/01/22 12:00 AM  Result Value Ref Range   TSH 2.50 0.41 - 5.90

## 2022-09-19 IMAGING — CT CT ANGIO CHEST
2 of 7 series · 17 of 46 positions shown · IV contrast (agent unspecified)
Comparison: None.

CLINICAL DATA: Chest pain positive D-dimer

EXAM:
CT ANGIOGRAPHY CHEST WITH CONTRAST
TECHNIQUE: Multidetector CT imaging of the chest was performed using the
standard protocol during bolus administration of intravenous
contrast. Multiplanar CT image reconstructions and MIPs were
obtained to evaluate the vascular anatomy.

[Series 5: pe axial thins · axial · 0.66mm/px · z∈[-92,+156]mm · 14 of 288 slices shown]
[im 20/288  lung]
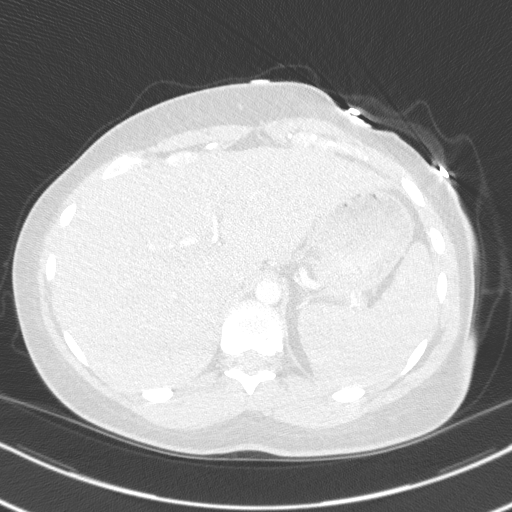
[im 39/288  soft-tissue]
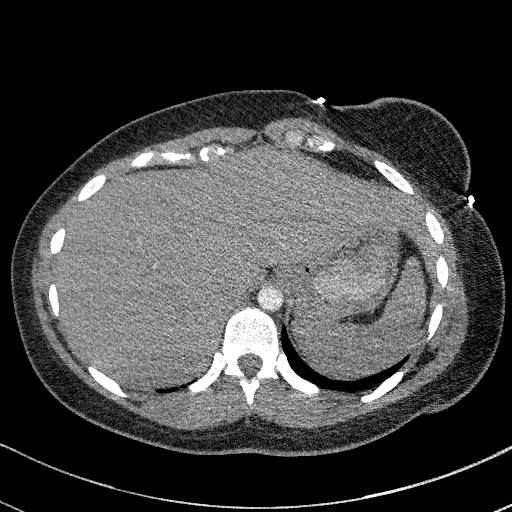
[im 58/288  lung]
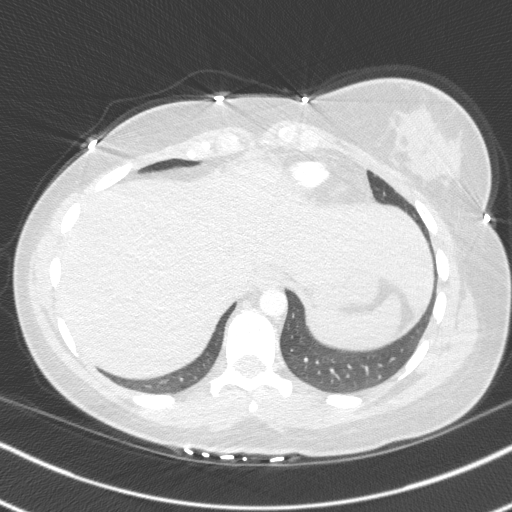
[im 77/288  soft-tissue]
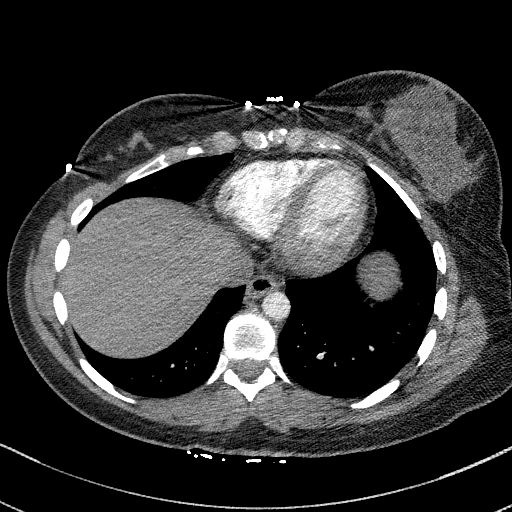
[im 96/288  lung]
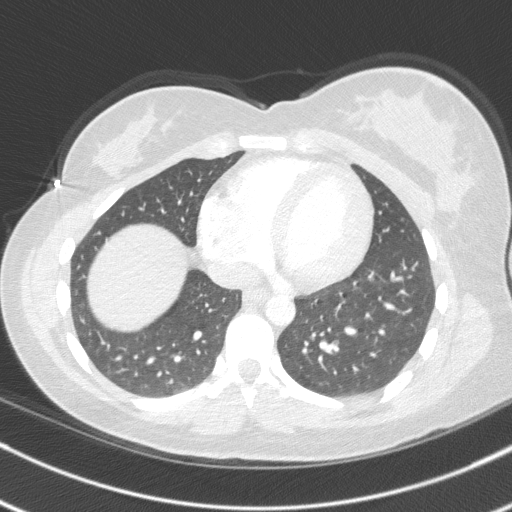
[im 115/288  soft-tissue]
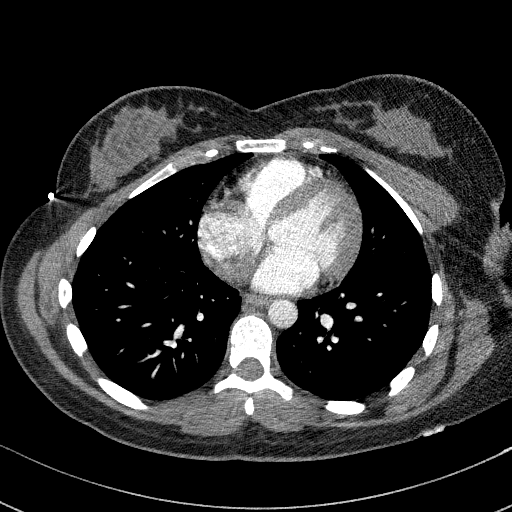
[im 134/288  lung]
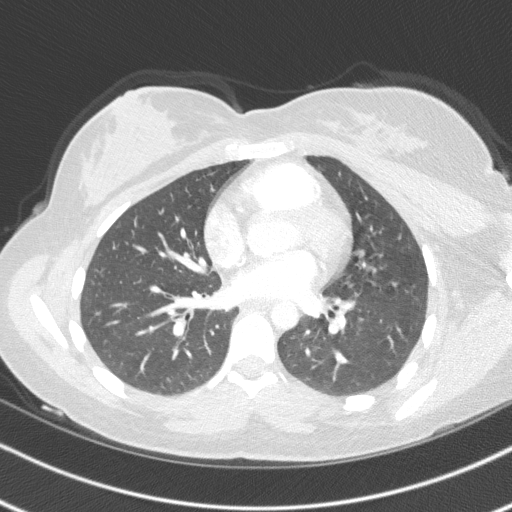
[im 154/288  soft-tissue]
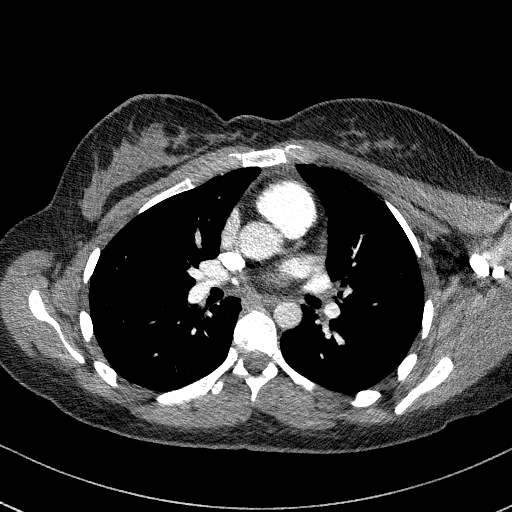
[im 173/288  lung]
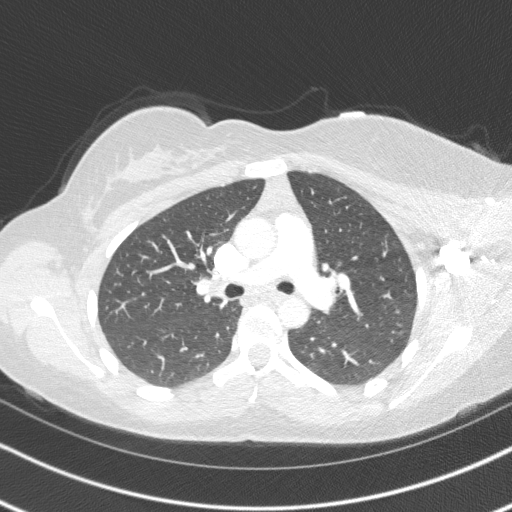
[im 192/288  soft-tissue]
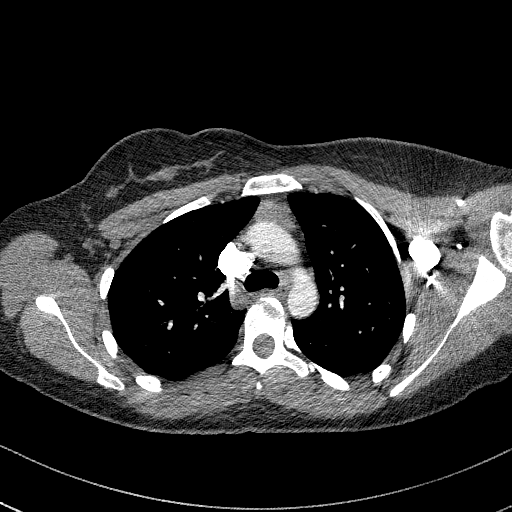
[im 211/288  lung]
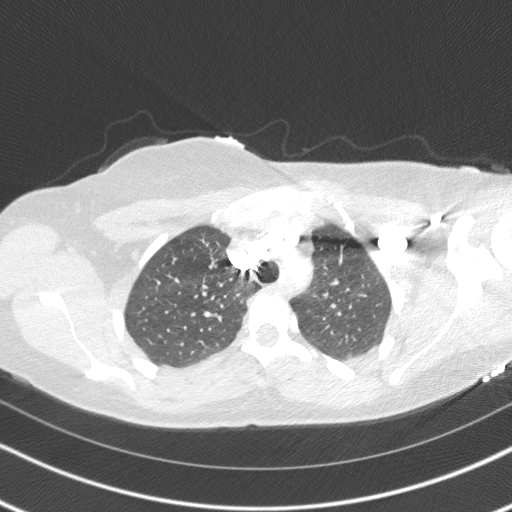
[im 230/288  soft-tissue]
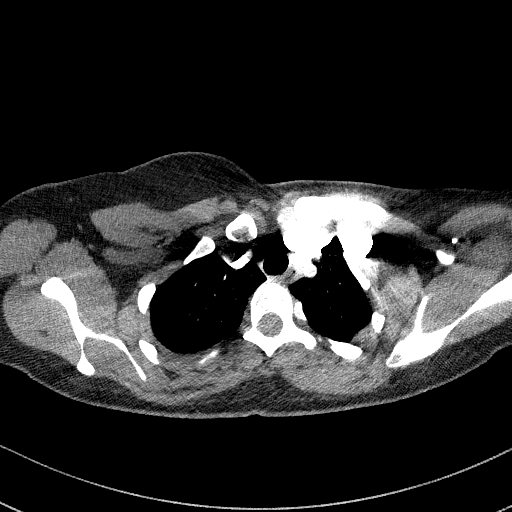
[im 249/288  lung]
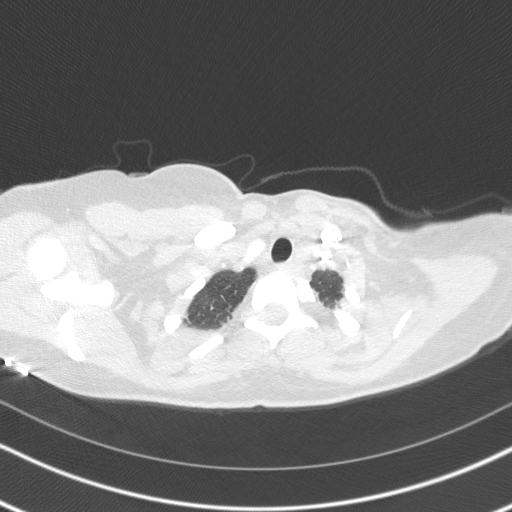
[im 268/288  soft-tissue]
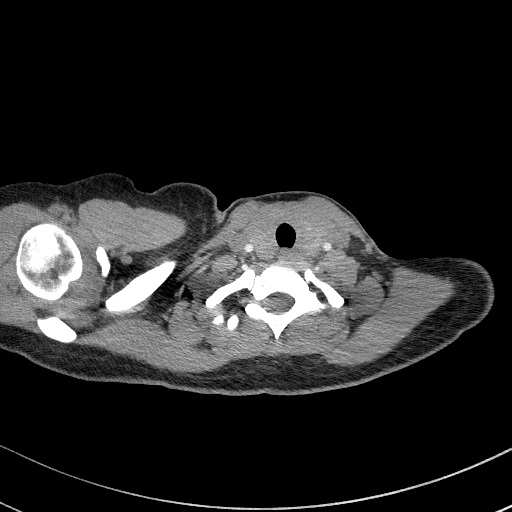

[Series 7: cor soft · coronal · 0.57mm/px · 3 of 130 slices shown]
[im 33/130  soft-tissue]
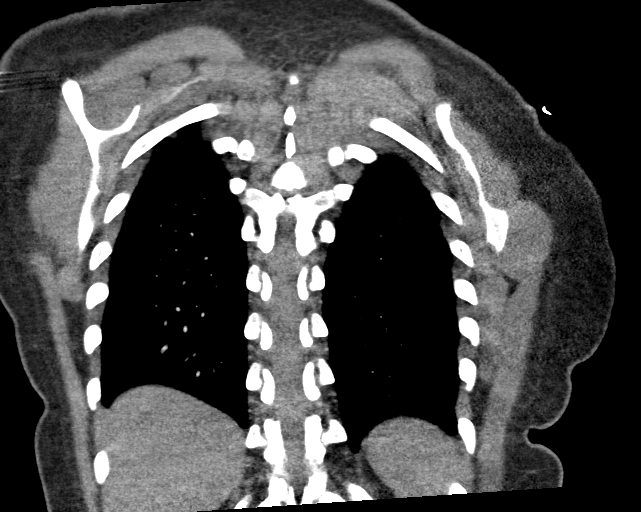
[im 65/130  soft-tissue]
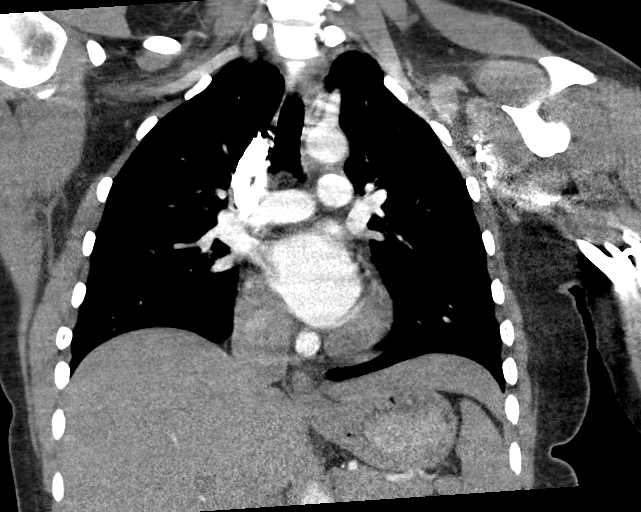
[im 97/130  soft-tissue]
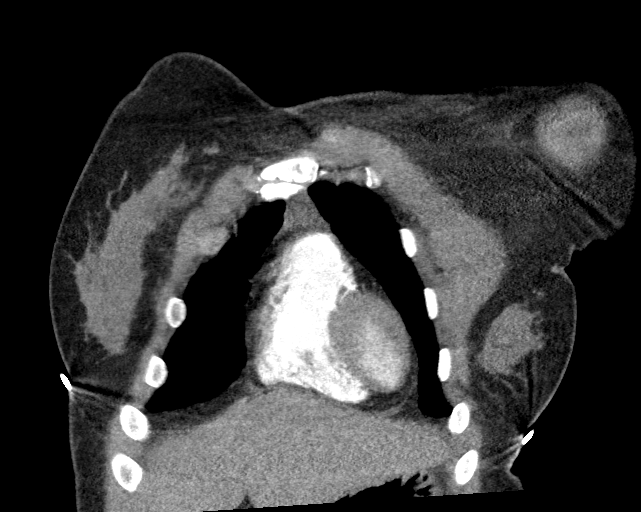

[17 of 46 positions shown; findings below may reference images not displayed]

RADIATION DOSE REDUCTION: This exam was performed according to the
departmental dose-optimization program which includes automated
exposure control, adjustment of the mA and/or kV according to
patient size and/or use of iterative reconstruction technique.

CONTRAST:  75mL OMNIPAQUE IOHEXOL 350 MG/ML SOLN
FINDINGS: Cardiovascular: Satisfactory opacification of the pulmonary arteries
to the segmental level. No evidence of pulmonary embolism. Normal
heart size. No pericardial effusion. Nonaneurysmal aorta.

Mediastinum/Nodes: No enlarged mediastinal, hilar, or axillary lymph
nodes. Thyroid gland, trachea, and esophagus demonstrate no
significant findings.

Lungs/Pleura: Lungs are clear. No pleural effusion or pneumothorax.

Upper Abdomen: No acute abnormality.

Musculoskeletal: No chest wall abnormality. No acute or significant
osseous findings.

Review of the MIP images confirms the above findings.
IMPRESSION: 1. Negative for acute pulmonary embolus.
2. Clear lung fields

## 2022-09-29 ENCOUNTER — Ambulatory Visit (INDEPENDENT_AMBULATORY_CARE_PROVIDER_SITE_OTHER): Payer: Managed Care, Other (non HMO) | Admitting: Nurse Practitioner

## 2022-09-29 ENCOUNTER — Encounter: Payer: Self-pay | Admitting: Nurse Practitioner

## 2022-09-29 VITALS — BP 118/80 | Ht 67.0 in | Wt 200.0 lb

## 2022-09-29 DIAGNOSIS — Z01419 Encounter for gynecological examination (general) (routine) without abnormal findings: Secondary | ICD-10-CM | POA: Diagnosis not present

## 2022-09-29 NOTE — Progress Notes (Signed)
   Jennifer Nelson [birth year] [id number]   History:  27 y.o. G2P2002 presents as new patient to establish care. Monthly cycles. ASCUS pap 2020, otherwise normal history. Thyroid disease managed by endocrinology.    Gynecologic History Patient's last menstrual period was 09/05/2022.   Contraception/Family planning: vasectomy Sexually active: Yes  Health Maintenance Last Pap: 09/27/2021. Results were: Normal Last mammogram: Not indicated Last colonoscopy: Not indicated Last Dexa: Not indicated  Past medical history, past surgical history, family history and social history were all reviewed and documented in the EPIC chart. 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 29 yo son, 29 yo daughter.   ROS:  A ROS was performed and pertinent positives and negatives are included.  Exam:  Vitals:   09/29/22 1041  BP: 118/80  Weight: 200 lb (90.7 kg)  Height: '5\' 7"'$  (1.702 m)   Body mass index is 31.32 kg/m.  General appearance:  Normal Thyroid:  Symmetrical, normal in size, without palpable masses or nodularity. Respiratory  Auscultation:  Clear without wheezing or rhonchi Cardiovascular  Auscultation:  Regular rate, without rubs, murmurs or gallops  Edema/varicosities:  Not grossly evident Abdominal  Soft,nontender, without masses, guarding or rebound.  Liver/spleen:  No organomegaly noted  Hernia:  None appreciated  Skin  Inspection:  Grossly normal Breasts: Examined lying and sitting.   Right: Without masses, retractions, nipple discharge or axillary adenopathy.   Left: Without masses, retractions, nipple discharge or axillary adenopathy. Genitourinary   Inguinal/mons:  Normal without inguinal adenopathy  External genitalia:  Normal appearing vulva with no masses, tenderness, or lesions  BUS/Urethra/Skene's glands:  Normal  Vagina:  Normal appearing with normal color and discharge, no lesions  Cervix:  Normal appearing without discharge or lesions  Uterus:  Normal in size, shape and  contour.  Midline and mobile, nontender  Adnexa/parametria:     Rt: Normal in size, without masses or tenderness.   Lt: Normal in size, without masses or tenderness.  Anus and perineum: Normal  Digital rectal exam: Deferred  Patient informed chaperone available to be present for breast and pelvic exam. Patient has requested no chaperone to be present. Patient has been advised what will be completed during breast and pelvic exam.   Assessment/Plan:  27 y.o. G2P2002 to establish care.   Well female exam with routine gynecological exam - Education provided on SBEs, importance of preventative screenings, current guidelines, high calcium diet, regular exercise, and multivitamin daily.  Labs with PCP.   Screening for cervical cancer - Normal Pap history.  Will repeat at 3-year interval per guidelines.    Return in 1 year for annual.     Tamela Gammon DNP, 11:01 AM 09/29/2022

## 2022-10-20 DIAGNOSIS — O905 Postpartum thyroiditis: Secondary | ICD-10-CM | POA: Diagnosis not present

## 2022-10-20 DIAGNOSIS — R400 Somnolence: Secondary | ICD-10-CM | POA: Diagnosis not present

## 2022-10-20 DIAGNOSIS — R0683 Snoring: Secondary | ICD-10-CM | POA: Diagnosis not present

## 2022-11-22 ENCOUNTER — Ambulatory Visit: Payer: Self-pay

## 2022-11-22 ENCOUNTER — Ambulatory Visit: Payer: Self-pay | Admitting: Physician Assistant

## 2022-11-22 ENCOUNTER — Encounter: Payer: Self-pay | Admitting: Physician Assistant

## 2022-11-22 VITALS — BP 108/61 | HR 98 | Resp 14 | Ht 67.0 in | Wt 187.0 lb

## 2022-11-22 DIAGNOSIS — Z021 Encounter for pre-employment examination: Secondary | ICD-10-CM

## 2022-11-22 LAB — POCT URINALYSIS DIPSTICK
Bilirubin, UA: NEGATIVE
Blood, UA: NEGATIVE
Glucose, UA: NEGATIVE
Leukocytes, UA: NEGATIVE
Nitrite, UA: NEGATIVE
Protein, UA: NEGATIVE
Spec Grav, UA: 1.015 (ref 1.010–1.025)
Urobilinogen, UA: 0.2 E.U./dL
pH, UA: 6 (ref 5.0–8.0)

## 2022-11-22 NOTE — Progress Notes (Signed)
Pt completed labs for PO1 physical, and Cleared UDS.

## 2022-11-22 NOTE — Progress Notes (Signed)
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 (Police) Gretel Acre

## 2022-11-22 NOTE — Progress Notes (Signed)
[facility]  ____________________________________________   None    (approximate)  I have reviewed the triage vital signs and the nursing notes.   HISTORY  Chief Complaint No chief complaint on file.    HPI Jennifer Nelson is a 27 y.o. female patient presents for preemployment physical for [employer].  Patient voices no concerns or complaints.         Past Medical History:  Diagnosis Date   Anxiety    Depression    GERD (gastroesophageal reflux disease)    Headache    Vaginal delivery    2020, 2023   Vaginal Pap smear, abnormal     Patient Active Problem List   Diagnosis Date Noted   Postpartum thyroiditis 12/02/2021   Acute stress disorder 09/15/2021   History of LEEP (loop electrosurgical excision procedure) of cervix complicating pregnancy 01/28/2021   CIN III (cervical intraepithelial neoplasia grade III) with severe dysplasia 02/22/2019   Atypical glandular cells of undetermined significance (AGUS) on cervical Pap smear 11/07/2018    Past Surgical History:  Procedure Laterality Date   DENTAL SURGERY      Prior to Admission medications   Medication Sig Start Date End Date Taking? Authorizing Provider  levothyroxine (SYNTHROID) 112 MCG tablet Take 112 mcg by mouth every morning. 04/01/22  Yes [provider]    Allergies Esomeprazole magnesium, Fluoxetine, and Sertraline  Family History  Problem Relation Age of Onset   Depression Mother    Luiz Blare' disease Mother    Kidney cancer Mother    Arthritis Father    Alcohol abuse Father    Hypertension Father    Glaucoma Father    Skin cancer Father    Lung cancer Father        stage 3   Depression Sister    Mental illness Sister    Mental illness Sister    Drug abuse Sister    Depression Sister    Mental illness Brother    Depression Brother    Asthma Brother    Heart disease Maternal Grandmother    Early death Maternal Grandmother  21   Diabetes Maternal Grandmother    COPD Maternal Grandfather    Alcohol abuse Paternal Grandfather     Social History Social History   Tobacco Use   Smoking status: Never    Passive exposure: Never   Smokeless tobacco: Never  Vaping Use   Vaping Use: Never used  Substance Use Topics   Alcohol use: Yes    Comment: socially   Drug use: Never    Review of Systems Constitutional: No fever/chills Eyes: No visual changes. ENT: No sore throat. Cardiovascular: Denies chest pain. Respiratory: Denies shortness of breath. Gastrointestinal: No abdominal pain.  No nausea, no vomiting.  No diarrhea.  No constipation.  GERD Genitourinary: Negative for dysuria. Musculoskeletal: Negative for back pain. Skin: Negative for rash. Neurological: Negative for headaches, focal weakness or numbness. Psychiatric: Anxiety and depression Allergic/Immunilogical: Esomeprazole, Fluoxetine, and Sertraline    ____________________________________________   PHYSICAL EXAM:  VITAL SIGNS: BP 108/61  Pulse 98  Resp 14  SpO2 98 %  Weight 187 lb (84.8 kg)  Height 5\' 7"  (1.702 m)   BMI 29.29 kg/m2  BSA 2.00 m2  Constitutional: Alert and oriented. Well appearing and in no acute distress. Eyes: Conjunctivae are normal. PERRL. EOMI. Head: Atraumatic. Nose: No congestion/rhinnorhea. Mouth/Throat: Mucous membranes are moist.  Oropharynx non-erythematous. Neck: No stridor. No cervical spine tenderness to palpation. Hematological/Lymphatic/Immunilogical: No  cervical lymphadenopathy. Cardiovascular: Normal rate, regular rhythm. Grossly normal heart sounds.  Good peripheral circulation. Respiratory: Normal respiratory effort.  No retractions. Lungs CTAB. Gastrointestinal: Soft and nontender. No distention. No abdominal bruits. No CVA tenderness. Genitourinary: Deferred Musculoskeletal: No lower extremity tenderness nor edema.  No joint effusions. Neurologic:  Normal speech and language. No gross focal  neurologic deficits are appreciated. No gait instability. Skin:  Skin is warm, dry and intact. No rash noted. Psychiatric: Mood and affect are normal. Speech and behavior are normal.  ____________________________________________   LABS Pending____________________________________________  EKG  75 bpm.  Incomplete right bundle branch block. ____________________________________________     ____________________________________________   INITIAL IMPRESSION / ASSESSMENT AND PLAN   As part of my medical decision making, I reviewed the following data within the electronic MEDICAL RECORD NUMBER      No acute findings on physical exam.  EKG shows new onset incomplete right bundle branch block.  Labs are pending.        ____________________________________________   FINAL CLINICAL IMPRESSION Well exam   ED Discharge Orders     None        Note:  This document was prepared using Dragon voice recognition software and may include unintentional dictation errors.

## 2022-11-23 ENCOUNTER — Ambulatory Visit (INDEPENDENT_AMBULATORY_CARE_PROVIDER_SITE_OTHER): Payer: Managed Care, Other (non HMO) | Admitting: Physician Assistant

## 2022-11-23 ENCOUNTER — Encounter: Payer: Self-pay | Admitting: Physician Assistant

## 2022-11-23 VITALS — BP 118/80 | HR 79 | Temp 97.7°F | Ht 67.0 in | Wt 187.0 lb

## 2022-11-23 DIAGNOSIS — E785 Hyperlipidemia, unspecified: Secondary | ICD-10-CM

## 2022-11-23 DIAGNOSIS — R9431 Abnormal electrocardiogram [ECG] [EKG]: Secondary | ICD-10-CM

## 2022-11-23 DIAGNOSIS — R7309 Other abnormal glucose: Secondary | ICD-10-CM

## 2022-11-23 LAB — CMP12+LP+TP+TSH+6AC+CBC/D/PLT
ALT: 15 IU/L (ref 0–32)
AST: 21 IU/L (ref 0–40)
Albumin/Globulin Ratio: 1.7 (ref 1.2–2.2)
Albumin: 4.6 g/dL (ref 4.0–5.0)
Alkaline Phosphatase: 50 IU/L (ref 44–121)
BUN/Creatinine Ratio: 15 (ref 9–23)
BUN: 11 mg/dL (ref 6–20)
Basophils Absolute: 0 10*3/uL (ref 0.0–0.2)
Basos: 1 %
Bilirubin Total: 0.3 mg/dL (ref 0.0–1.2)
Calcium: 9.4 mg/dL (ref 8.7–10.2)
Chloride: 102 mmol/L (ref 96–106)
Chol/HDL Ratio: 3.5 ratio (ref 0.0–4.4)
Cholesterol, Total: 194 mg/dL (ref 100–199)
Creatinine, Ser: 0.75 mg/dL (ref 0.57–1.00)
EOS (ABSOLUTE): 0.1 10*3/uL (ref 0.0–0.4)
Eos: 2 %
Estimated CHD Risk: 0.6 times avg. (ref 0.0–1.0)
Free Thyroxine Index: 1.6 (ref 1.2–4.9)
GGT: 10 IU/L (ref 0–60)
Globulin, Total: 2.7 g/dL (ref 1.5–4.5)
Glucose: 155 mg/dL — ABNORMAL HIGH (ref 70–99)
HDL: 56 mg/dL (ref >39)
Hematocrit: 41.9 % (ref 34.0–46.6)
Hemoglobin: 14.3 g/dL (ref 11.1–15.9)
Immature Grans (Abs): 0 10*3/uL (ref 0.0–0.1)
Immature Granulocytes: 0 %
Iron: 110 ug/dL (ref 27–159)
LDH: 139 IU/L (ref 119–226)
LDL Chol Calc (NIH): 122 mg/dL — ABNORMAL HIGH (ref 0–99)
Lymphocytes Absolute: 1.7 10*3/uL (ref 0.7–3.1)
Lymphs: 33 %
MCH: 30.1 pg (ref 26.6–33.0)
MCHC: 34.1 g/dL (ref 31.5–35.7)
MCV: 88 fL (ref 79–97)
Monocytes Absolute: 0.2 10*3/uL (ref 0.1–0.9)
Monocytes: 4 %
Neutrophils Absolute: 3.1 10*3/uL (ref 1.4–7.0)
Neutrophils: 60 %
Phosphorus: 3.4 mg/dL (ref 3.0–4.3)
Platelets: 321 10*3/uL (ref 150–450)
Potassium: 3.7 mmol/L (ref 3.5–5.2)
RBC: 4.75 x10E6/uL (ref 3.77–5.28)
RDW: 12.5 % (ref 11.7–15.4)
Sodium: 139 mmol/L (ref 134–144)
T3 Uptake Ratio: 26 % (ref 24–39)
T4, Total: 6 ug/dL (ref 4.5–12.0)
TSH: 1.88 u[IU]/mL (ref 0.450–4.500)
Total Protein: 7.3 g/dL (ref 6.0–8.5)
Triglycerides: 87 mg/dL (ref 0–149)
Uric Acid: 4.3 mg/dL (ref 2.6–6.2)
VLDL Cholesterol Cal: 16 mg/dL (ref 5–40)
WBC: 5.2 10*3/uL (ref 3.4–10.8)
eGFR: 110 mL/min/{1.73_m2} (ref >59)

## 2022-11-23 LAB — POCT GLYCOSYLATED HEMOGLOBIN (HGB A1C): Hemoglobin A1C: 5.3 % (ref 4.0–5.6)

## 2022-11-23 LAB — HEPATITIS B SURFACE ANTIBODY,QUALITATIVE: Hep B Surface Ab, Qual: REACTIVE

## 2022-11-23 NOTE — Progress Notes (Signed)
Jennifer Nelson is a 27 y.o. female here to discuss results after a pre-employment physical on 11/22/22.  History of Present Illness:   Chief Complaint  Patient presents with   lab results    Pt had physical yesterday with the city for a new job. EKG was abnormal and some labs. Pt would like to discuss.    HPI  Irregular EKG 11/22/22 EKG showed new onset incomplete right bundle branch block. Denies chest pain, shortness of breath, palpitations.  Elevated glucose Glucose elevated at 155 on 11/12/22. She was not fasting at the time. Point-of-care HGBA1c today is 5.3%. Denies gestational diabetes.  Elevated cholesterol LDL elevated at 122. Family history of hyperlipidemia in father.  Past Medical History:  Diagnosis Date   Anxiety    Depression    GERD (gastroesophageal reflux disease)    Headache    Vaginal delivery    2020, 2023   Vaginal Pap smear, abnormal      Social History   Tobacco Use   Smoking status: Never    Passive exposure: Never   Smokeless tobacco: Never  Vaping Use   Vaping Use: Never used  Substance Use Topics   Alcohol use: Yes    Comment: socially   Drug use: Never    Past Surgical History:  Procedure Laterality Date   DENTAL SURGERY      Family History  Problem Relation Age of Onset   Depression Mother    Luiz Blare' disease Mother    Kidney cancer Mother    Arthritis Father    Alcohol abuse Father    Hypertension Father    Glaucoma Father    Skin cancer Father    Lung cancer Father        stage 3   Depression Sister    Mental illness Sister    Mental illness Sister    Drug abuse Sister    Depression Sister    Mental illness Brother    Depression Brother    Asthma Brother    Heart disease Maternal Grandmother    Early death Maternal Grandmother 25   Diabetes Maternal Grandmother    COPD Maternal Grandfather    Alcohol abuse Paternal Grandfather     Allergies  Allergen Reactions   Esomeprazole Magnesium Nausea And  Vomiting   Fluoxetine Hives   Sertraline Hives    Current Medications:   Current Outpatient Medications:    levothyroxine (SYNTHROID) 112 MCG tablet, Take 112 mcg by mouth every morning., Disp: , Rfl:    Review of Systems:   Review of Systems  Constitutional:  Negative for fever and malaise/fatigue.  HENT:  Negative for congestion.   Eyes:  Negative for blurred vision.  Respiratory:  Negative for cough and shortness of breath.   Cardiovascular:  Negative for chest pain, palpitations and leg swelling.  Gastrointestinal:  Negative for blood in stool and vomiting.  Musculoskeletal:  Negative for back pain.  Skin:  Negative for rash.  Neurological:  Negative for loss of consciousness and headaches.    Vitals:   Vitals:   11/23/22 1458  BP: 118/80  Pulse: 79  Temp: 97.7 F (36.5 C)  TempSrc: Temporal  SpO2: 98%  Weight: 187 lb (84.8 kg)  Height: 5\' 7"  (1.702 m)     Body mass index is 29.29 kg/m.  Physical Exam:   Physical Exam Vitals and nursing note reviewed.  Constitutional:      General: She is not in acute distress.    Appearance: She  is well-developed. She is not ill-appearing or toxic-appearing.  Cardiovascular:     Rate and Rhythm: Normal rate and regular rhythm.     Pulses: Normal pulses.     Heart sounds: Normal heart sounds, S1 normal and S2 normal.  Pulmonary:     Effort: Pulmonary effort is normal.     Breath sounds: Normal breath sounds.  Skin:    General: Skin is warm and dry.  Neurological:     Mental Status: She is alert.     GCS: GCS eye subscore is 4. GCS verbal subscore is 5. GCS motor subscore is 6.  Psychiatric:        Speech: Speech normal.        Behavior: Behavior normal. Behavior is cooperative.    Results for orders placed or performed in visit on 11/23/22  POCT HgB A1C  Result Value Ref Range   Hemoglobin A1C 5.3 4.0 - 5.6 %    Assessment and Plan:   Abnormal EKG Will refer to cardiology for further evaluation as she is  headed back into [employer] and will be quite active  Hyperlipidemia, unspecified hyperlipidemia type No need for medication at this time  Elevated glucose A1c is well controlled Suspect elevated glucose was due to something consumed prior to appointment  No further work-up needed    I,Alexander Ruley,acting as a scribe for [employer], PA.,have documented all relevant documentation on the behalf of Jarold Motto, PA,as directed by  Jarold Motto, PA while in the presence of Jarold Motto, [place].   I, Jarold Motto, [place], have reviewed all documentation for this visit. The documentation on 11/23/22 for the exam, diagnosis, procedures, and orders are all accurate and complete.    Jarold Motto, PA-C

## 2022-12-13 ENCOUNTER — Ambulatory Visit: Payer: Managed Care, Other (non HMO) | Attending: Cardiology | Admitting: Cardiology

## 2022-12-13 ENCOUNTER — Encounter: Payer: Self-pay | Admitting: Cardiology

## 2022-12-13 VITALS — BP 94/70 | HR 82 | Ht 67.0 in | Wt 187.0 lb

## 2022-12-13 DIAGNOSIS — I4510 Unspecified right bundle-branch block: Secondary | ICD-10-CM | POA: Diagnosis not present

## 2022-12-13 DIAGNOSIS — R9431 Abnormal electrocardiogram [ECG] [EKG]: Secondary | ICD-10-CM | POA: Diagnosis not present

## 2022-12-13 NOTE — Assessment & Plan Note (Signed)
Incomplete right bundle block is basically you have a similar finding as RSR'.  Whether it is 1 L truly that has is finding is a matter of lead placement.  This is a finding that she has had dating back to April 2023 so it is clearly not "new onset ".  It is a stable nonspecific nondiagnostic benign finding and the patient is otherwise healthy with no active symptoms.  This finding should not always affect her ability to pursue appointment with the [facility].

## 2022-12-13 NOTE — Patient Instructions (Signed)
Medication Instructions:   No changes   Lab Work: Not needed    Testing/Procedures:  Not needed  Follow-Up: At [facility], you and your health needs are our priority.  As part of our continuing mission to provide you with exceptional heart care, we have created designated Provider Care Teams.  These Care Teams include your primary Cardiologist (physician) and Advanced Practice Providers (APPs -  Physician Assistants and Nurse Practitioners) who all work together to provide you with the care you need, when you need it.     Your next appointment:   As needed  The format for your next appointment:   In Person  Provider:   Bryan Lemma, MD    Other Instructions    Benign finding with EKG  may return to work without restrictions

## 2022-12-13 NOTE — Progress Notes (Signed)
Primary Care Provider: Jarold Motto, [place] [facility] Cardiologist: Bryan Lemma, MD Electrophysiologist: None  Clinic Note: Chief Complaint  Patient presents with   New Patient (Initial Visit)    Discussed findings on EKG    ===================================  ASSESSMENT/PLAN   Problem List Items Addressed This Visit       Cardiology Problems   Incomplete right bundle branch block    Incomplete right bundle block is basically you have a similar finding as RSR'.  Whether it is 1 L truly that has is finding is a matter of lead placement.  This is a finding that she has had dating back to April 2023 so it is clearly not "new onset ".  It is a stable nonspecific nondiagnostic benign finding and the patient is otherwise healthy with no active symptoms.  This finding should not always affect her ability to pursue appointment with the [facility].      Other Visit Diagnoses     Nonspecific abnormal electrocardiogram (ECG) (EKG)    -  Primary   Relevant Orders   EKG 12-Lead       ===================================  HPI:    Jennifer Nelson is a 27 y.o. female who is being seen today for the evaluation of Abnormal EKG at the request of Jarold Motto, [place].  Taheerah Hazelwood Clearman was seen by Jarold Motto, PA on May 1 to follow-up from a preemployment physical examination for the [employer].  There was concern because of "abnormal EKG ".  EKG showed RSR' pattern/borderline incomplete right bundle branch block.  (Similar finding seen on EKG here today, and also seen in April 2023).  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  Recent Hospitalizations: None  Reviewed  CV studies:    The following studies were reviewed today: (if available, images/films reviewed: From Epic Chart or Care Everywhere) None:   Interval History:   Jennifer Hazelwood  Nelson presents here today almost prophylactically seeking out cardiology assistance continues to the question about the EKG.  She is not aware of any previous issues with EKGs in the past.  She was evaluated in 2023 for chest pain which was evaluated and felt to be noncardiac in nature.  It was probably GERD based on how she felt that she was taking OTC medicines.  Her only history is hypothyroidism for which she takes Synthroid.  Her blood pressure [place] a little on the low side although she denies any dizziness or wooziness.    Most importantly, from a cardiac standpoint, she denies any chest pain or pressure with rest or exertion.  No palpitations.  No PND, orthopnea or edema.  She is very active.  She had taken a break from work 2 years ago to allow for the birth of her second child and maternity leave.  There was no medical reason for her to stop simply due to time to take spent with her children.  She has remained active and exercising throughout this timeframe and continue to do so at this point.  She did not really [place] over with the preemployment physical in past.  CV Review of Symptoms (Summary): no chest pain or dyspnea on exertion negative for - edema, irregular heartbeat, orthopnea, palpitations, paroxysmal nocturnal dyspnea, rapid heart rate, shortness of breath, or lightheadedness, dizziness or wooziness, syncope/near syncope or TIA/amaurosis fugax, claudication  REVIEWED OF SYSTEMS   Review of Systems  Constitutional:  Negative  for malaise/fatigue and weight loss.  HENT:  Negative for nosebleeds.   Respiratory:  Negative for cough and shortness of breath.   Cardiovascular:  Negative for leg swelling.  Gastrointestinal:  Negative for blood in stool and melena.  Genitourinary:  Negative for flank pain and hematuria.  Musculoskeletal:  Negative for joint pain.  Neurological:  Negative for dizziness and weakness.  Psychiatric/Behavioral:  The patient is not nervous/anxious and  does not have insomnia.   All other systems reviewed and are negative.   I have reviewed and (if needed) personally updated the patient's problem list, medications, allergies, past medical and surgical history, social and family history.   PAST MEDICAL HISTORY   Past Medical History:  Diagnosis Date   Anxiety    Depression    GERD (gastroesophageal reflux disease)    Headache    Vaginal delivery    2020, 2023   Vaginal Pap smear, abnormal     PAST SURGICAL HISTORY   Past Surgical History:  Procedure Laterality Date   DENTAL SURGERY     MEDICATIONS/ALLERGIES   Current Meds  Medication Sig   levothyroxine (SYNTHROID) 50 MCG tablet Take 50 mcg by mouth every morning.    Allergies  Allergen Reactions   Esomeprazole Magnesium Nausea And Vomiting   Fluoxetine Hives   Sertraline Hives    SOCIAL HISTORY/FAMILY HISTORY   Reviewed in Epic:   Social History   Tobacco Use   Smoking status: Never    Passive exposure: Never   Smokeless tobacco: Never  Vaping Use   Vaping Use: Never used  Substance Use Topics   Alcohol use: Yes    Comment: socially   Drug use: Never   Social History   Social History Narrative   Was a Emergency planning/management officer, now in Research officer, political party   Has two children a boy and girl   Family History  Problem Relation Age of Onset   Depression Mother    Graves' disease Mother    Kidney cancer Mother    Arthritis Father    Alcohol abuse Father    Hypertension Father    Glaucoma Father    Skin cancer Father    Lung cancer Father        stage 3   Depression Sister    Mental illness Sister    Mental illness Sister    Drug abuse Sister    Depression Sister    Mental illness Brother    Depression Brother    Asthma Brother    Heart disease Maternal Grandmother    Early death Maternal Grandmother 98   Diabetes Maternal Grandmother    COPD Maternal Grandfather    Alcohol abuse Paternal Grandfather     OBJCTIVE -PE, EKG, labs   Wt Readings from Last  3 Encounters:  12/13/22 187 lb (84.8 kg)  11/23/22 187 lb (84.8 kg)  11/22/22 187 lb (84.8 kg)    Physical Exam: BP 94/70 (BP Location: Left Arm, Patient Position: Sitting, Cuff Size: Large)   Pulse 82   Ht 5\' 7"  (1.702 m)   Wt 187 lb (84.8 kg)   LMP 11/12/2022 (Approximate)   BMI 29.29 kg/m  Physical Exam Vitals reviewed.  Constitutional:      General: She is not in acute distress.    Appearance: Normal appearance. She is normal weight. She is not ill-appearing or toxic-appearing.  HENT:     Head: Normocephalic and atraumatic.  Neck:     Vascular: No carotid bruit.  Cardiovascular:  Rate and Rhythm: Normal rate and regular rhythm.     Pulses: Normal pulses.     Heart sounds: Normal heart sounds. No murmur heard.    No friction rub. No gallop.  Pulmonary:     Effort: Pulmonary effort is normal. No respiratory distress.     Breath sounds: Normal breath sounds. No wheezing, rhonchi or rales.  Abdominal:     General: Abdomen is flat. Bowel sounds are normal. There is no distension.     Palpations: Abdomen is soft. There is no mass.     Tenderness: There is no abdominal tenderness.     Comments: No HSM or summary.  Musculoskeletal:        General: No swelling. Normal range of motion.     Cervical back: Normal range of motion and neck supple.  Skin:    General: Skin is warm and dry.  Neurological:     General: No focal deficit present.     Mental Status: She is alert and oriented to person, place, and time.     Gait: Gait normal.  Psychiatric:        Mood and Affect: Mood normal.        Behavior: Behavior normal.        Thought Content: Thought content normal.        Judgment: Judgment normal.      Adult ECG Report  (11/02/2021) Rate: 78 ;  Rhythm: normal sinus rhythm; -RSR'(V1) -.  Otherwise normal axis, intervals durations.  Narrative Interpretation: non-diagnostic    (11/22/2022) -- EKG in question:  Rate: 75;  Rhythm: normal sinus rhythm; incomplete right  bundle branch block (same as SR'(V1) -.  Otherwise normal axis, intervals durations.  Narrative Interpretation: abnormal  --Cardiologist Interpretation --> normal variant, benign finding.    (Today) Rate: 82;  Rhythm: normal sinus rhythm and RSR' (~ incomplete RBBB) ; otherwise normal axis, intervals and durations.  Narrative Interpretation: Nonspecific/stable finding.  Normal/benign finding.  Recent Labs: Reviewed Lab Results  Component Value Date   CHOL 194 11/22/2022   HDL 56 11/22/2022   LDLCALC 122 (H) 11/22/2022   TRIG 87 11/22/2022   CHOLHDL 3.5 11/22/2022   Lab Results  Component Value Date   CREATININE 0.75 11/22/2022   BUN 11 11/22/2022   NA 139 11/22/2022   K 3.7 11/22/2022   CL 102 11/22/2022   CO2 26 11/02/2021      Latest Ref Rng & Units 11/22/2022    2:33 PM 11/26/2021   12:00 AM 11/02/2021   11:28 AM  CBC  WBC 3.4 - 10.8 x10E3/uL 5.2  5.5     5.5   Hemoglobin 11.1 - 15.9 g/dL 16.1  09.6     04.5   Hematocrit 34.0 - 46.6 % 41.9  37     37.0   Platelets 150 - 450 x10E3/uL 321  332     310.0      This result is from an external source.    Lab Results  Component Value Date   HGBA1C 5.3 11/23/2022   Lab Results  Component Value Date   TSH 1.880 11/22/2022    ================================================== I spent a total of 25 minutes with the patient spent in direct patient consultation.  Additional time spent with chart review  / charting (studies, outside notes, etc): 24 min Total Time: 49 min  Current medicines are reviewed at length with the patient today.  (+/- concerns) none  Notice: This dictation was prepared with Dragon dictation  along with smart phrase technology. Any transcriptional errors that result from this process are unintentional and may not be corrected upon review.   Studies Ordered:  Orders Placed This Encounter  Procedures   EKG 12-Lead   No orders of the defined types were placed in this encounter.   Patient  Instructions / Medication Changes & Studies & Tests Ordered   Patient Instructions  Medication Instructions:   No changes   Lab Work: Not needed    Testing/Procedures:  Not needed  Follow-Up: At [facility], you and your health needs are our priority.  As part of our continuing mission to provide you with exceptional heart care, we have created designated Provider Care Teams.  These Care Teams include your primary Cardiologist (physician) and Advanced Practice Providers (APPs -  Physician Assistants and Nurse Practitioners) who all work together to provide you with the care you need, when you need it.     Your next appointment:   As needed  The format for your next appointment:   In Person  Provider:   Bryan Lemma, MD    Other Instructions    Benign finding with EKG  may return to work without restrictions    Marykay Lex, MD, MS Bryan Lemma, M.D., M.S. Interventional Cardiologist  [facility]  Pager # [phone] Phone # [phone] [address] [place], [place] [address]   Thank you for choosing [facility] [place]!!

## 2022-12-15 ENCOUNTER — Ambulatory Visit (INDEPENDENT_AMBULATORY_CARE_PROVIDER_SITE_OTHER): Payer: Managed Care, Other (non HMO) | Admitting: Family Medicine

## 2022-12-15 ENCOUNTER — Encounter: Payer: Self-pay | Admitting: Family Medicine

## 2022-12-15 VITALS — BP 108/60 | HR 83 | Temp 98.5°F | Ht 67.0 in | Wt 185.8 lb

## 2022-12-15 DIAGNOSIS — J069 Acute upper respiratory infection, unspecified: Secondary | ICD-10-CM | POA: Diagnosis not present

## 2022-12-15 DIAGNOSIS — L0100 Impetigo, unspecified: Secondary | ICD-10-CM

## 2022-12-15 MED ORDER — TRIAMCINOLONE ACETONIDE 0.1 % EX CREA
1.0000 | TOPICAL_CREAM | Freq: Two times a day (BID) | CUTANEOUS | 0 refills | Status: DC
Start: 2022-12-15 — End: 2023-04-04

## 2022-12-15 MED ORDER — MUPIROCIN 2 % EX OINT: 1.0000 | TOPICAL_OINTMENT | Freq: Two times a day (BID) | CUTANEOUS | 0 refills | Status: DC

## 2022-12-15 NOTE — Patient Instructions (Signed)
Please follow up if symptoms do not improve or as needed.    Impetigo, Adult Impetigo is an infection of the skin. It commonly occurs in young children, but it can also occur in adults. The infection causes itchy blisters and sores that produce brownish-yellow fluid. As the fluid dries, it forms a thick, honey-colored crust. These skin changes usually occur on the face, but they can also affect other areas of the body. Impetigo usually goes away in 7-10 days with treatment. What are the causes? This condition is caused by two types of bacteria. It may be caused by staphylococci or streptococci bacteria. These bacteria cause impetigo when they get under the surface of the skin. This often happens after some damage to the skin, such as: Cuts, scrapes, or scratches. Rashes. Insect bites, especially when you scratch the area of a bite. Chickenpox or other illnesses that cause open skin sores. Nail biting or chewing. Impetigo can spread easily from one person to another (is contagious). It may be spread through close skin contact or by sharing towels, clothing, or other items that an infected person has touched. Scratching the affected area can cause impetigo to spread to other parts of the body. The bacteria can get under your fingernails and spread when you touch another area of your skin. What increases the risk? The following factors may make you more likely to develop this condition: Playing sports that include skin-to-skin contact with others. Having broken skin, such as from a cut or scrape. Living in an area that has high humidity levels. Having poor hygiene. Having high levels of staphylococci in your nose. Having a condition that weakens the skin integrity, such as: Having a weak body defense system (immune system). Having a skin condition with open sores, such as chickenpox. Having diabetes. What are the signs or symptoms? The main symptom of this condition is small blisters, often on  the face around the mouth and nose. In time, the blisters break open and turn into tiny sores (lesions) with a yellow crust. In some cases, the blisters cause itching or burning. Scratching, irritation, or lack of treatment may cause these small lesions to get larger. Other possible symptoms include: Larger blisters. Pus. Swollen lymph glands. How is this diagnosed? This condition is usually diagnosed during a physical exam. A skin sample or a sample of fluid from a blister may be taken for lab tests that involve growing bacteria (culture test). Lab tests can help confirm the diagnosis or help determine the best treatment. How is this treated? Treatment for this condition depends on the severity of the condition: Mild impetigo can be treated with prescription antibiotic cream. Oral antibiotic medicine may be used in more severe cases. Medicines that reduce itchiness (antihistamines)may also be used. Follow these instructions at home: Medicines Take over-the-counter and prescription medicines only as told by your health care provider. Apply or take your antibiotic as told by your health care provider. Do not stop using the antibiotic even if your condition improves. Before applying antibiotic cream or ointment, you should: Gently wash the infected areas with antibacterial soap and warm water. Soak crusted areas in warm, soapy water using antibacterial soap. Gently rub the areas to remove crusts. Do not scrub. Preventing the spread of infection  To help prevent impetigo from spreading to other body areas: Keep your fingernails short and clean. Do not scratch the blisters or sores. Cover infected areas, if necessary, to keep from scratching. Wash your hands often with soap and warm  water. To help prevent impetigo from spreading to other people: Do not share towels. Wash your clothing and bedsheets in water that is 140F (60C) or warmer. Stay home until you have used an antibiotic cream  for 48 hours (2 days) or an oral antibiotic medicine for 24 hours (1 day). You should only return to work and activities with other people if your skin shows significant improvement. You may return to contact sports after you have used antibiotic medicine for 72 hours (3 days). General instructions Keep all follow-up visits. This is important. How is this prevented? Wash your hands often with soap and warm water. Do not share towels, washcloths, clothing, bedding, or razors. Keep your fingernails short. Keep any cuts, scrapes, bug bites, or rashes clean and covered. Use insect repellent to prevent bug bites. Contact a health care provider if: You develop more blisters or sores, even with treatment. Other family members get sores. Your skin sores are not improving after 72 hours (3 days) of treatment. You have a fever. Get help right away if: You see spreading redness or swelling of the skin around your sores. You develop a sore throat. The area around your rash becomes warm, red, or tender to the touch. You have dark, reddish-brown urine. You do not urinate often or you urinate small amounts. You are very tired (lethargic). You have swelling in your face, hands, or feet. Summary Impetigo is a skin infection that causes itchy blisters and sores that produce brownish-yellow fluid. As the fluid dries, it forms a crust. This condition is caused by staphylococci or streptococci bacteria. These bacteria cause impetigo when they get under the surface of the skin, such as through cuts, rashes, bug bites, or open sores. Treatment for this condition may include antibiotic ointment or oral antibiotics. To help prevent impetigo from spreading to other body areas, make sure you keep your fingernails short, avoid scratching, cover any blisters, and wash your hands often. If you have impetigo, stay home until you have used an antibiotic cream for 48 hours (2 days) or an oral antibiotic medicine for 24  hours (1 day). You should only return to work and activities with other people if your skin shows significant improvement. This information is not intended to replace advice given to you by your health care provider. Make sure you discuss any questions you have with your health care provider. Document Revised: 12/11/2019 Document Reviewed: 12/11/2019 Elsevier Patient Education  2024 [employer].

## 2022-12-15 NOTE — Progress Notes (Signed)
Subjective  CC:  Chief Complaint  Patient presents with   Rash    Pt has a rash on her Rt arm that has been there for the past month   Same day acute visit; PCP not available. New pt to me. Chart reviewed.   HPI: Jennifer Nelson is a 27 y.o. female who presents to the office today to address the problems listed above in the chief complaint. 27 year old healthy female with rash on ulnar volar wrist for 1 month.  Started as red dry itchy rash.  Mostly dry but at times becomes moist and crusted.  Has tried antifungal cream without any resolution.  Has not spread.  Was exposed to impetigo and a small child last month.  No systemic symptoms.  No history of eczema.  No other affected family members. Complains of ear fullness.  Had a cold last week.  Assessment  1. Impetigo   2. Upper respiratory tract infection, unspecified type      Plan  Rash, likely impetigo but could be eczematous.:  Discussed differential diagnosis.  Elect to treat with Bactroban twice daily.  If not proving can use Kenalog cream.  Follow-up if needed. Residual effusions from URI.  Reassured.  Recommend Claritin or decongestant if needed.    Follow up: As needed Visit date not found  No orders of the defined types were placed in this encounter.  Meds ordered this encounter  Medications   mupirocin ointment (BACTROBAN) 2 %    Sig: Apply 1 Application topically 2 (two) times daily.    Dispense:  22 g    Refill:  0   triamcinolone cream (KENALOG) 0.1 %    Sig: Apply 1 Application topically 2 (two) times daily. For 2 weeks, then as needed    Dispense:  45 g    Refill:  0      I reviewed the patients updated PMH, FH, and SocHx.    Patient Active Problem List   Diagnosis Date Noted   Incomplete right bundle branch block 12/13/2022   Postpartum thyroiditis 12/02/2021   Acute stress disorder 09/15/2021   History of LEEP (loop electrosurgical excision procedure) of cervix complicating pregnancy  01/28/2021   CIN III (cervical intraepithelial neoplasia grade III) with severe dysplasia 02/22/2019   Atypical glandular cells of undetermined significance (AGUS) on cervical Pap smear 11/07/2018   Current Meds  Medication Sig   levothyroxine (SYNTHROID) 50 MCG tablet Take 50 mcg by mouth every morning.   mupirocin ointment (BACTROBAN) 2 % Apply 1 Application topically 2 (two) times daily.   triamcinolone cream (KENALOG) 0.1 % Apply 1 Application topically 2 (two) times daily. For 2 weeks, then as needed    Allergies: Patient is allergic to esomeprazole magnesium, fluoxetine, and sertraline. Family History: Patient family history includes Alcohol abuse in her father and paternal grandfather; Arthritis in her father; Asthma in her brother; COPD in her maternal grandfather; Depression in her brother, mother, sister, and sister; Diabetes in her maternal grandmother; Drug abuse in her sister; Early death (age of onset: 72) in her maternal grandmother; Glaucoma in her father; Luiz Blare' disease in her mother; Heart disease in her maternal grandmother; Hypertension in her father; Kidney cancer in her mother; Lung cancer in her father; Mental illness in her brother, sister, and sister; Skin cancer in her father. Social History:  Patient  reports that she has never smoked. She has never been exposed to tobacco smoke. She has never used smokeless tobacco. She reports current alcohol  use. She reports that she does not use drugs.  Review of Systems: Constitutional: Negative for fever malaise or anorexia Cardiovascular: negative for chest pain Respiratory: negative for SOB or persistent cough Gastrointestinal: negative for abdominal pain  Objective  Vitals: BP 108/60   Pulse 83   Temp 98.5 F (36.9 C)   Ht 5\' 7"  (1.702 m)   Wt 185 lb 12.8 oz (84.3 kg)   LMP 11/12/2022 (Approximate)   SpO2 98%   BMI 29.10 kg/m  General: no acute distress , A&Ox3 HEENT: PEERL, conjunctiva normal, neck is supple,  bilateral TMs normal with serous effusions no erythema Skin:  Warm, right volar wrist with approximately 2 to 3 cm macular rash, some flaking, no papules, vesicles or satellite lesions.  Commons side effects, risks, benefits, and alternatives for medications and treatment plan prescribed today were discussed, and the patient expressed understanding of the given instructions. Patient is instructed to call or message via MyChart if he/she has any questions or concerns regarding our treatment plan. No barriers to understanding were identified. We discussed Red Flag symptoms and signs in detail. Patient expressed understanding regarding what to do in case of urgent or emergency type symptoms.  Medication list was reconciled, printed and provided to the patient in AVS. Patient instructions and summary information was reviewed with the patient as documented in the AVS. This note was prepared with assistance of Dragon voice recognition software. Occasional wrong-word or sound-a-like substitutions may have occurred due to the inherent limitations of voice recognition software

## 2023-02-19 DIAGNOSIS — J029 Acute pharyngitis, unspecified: Secondary | ICD-10-CM | POA: Diagnosis not present

## 2023-02-24 DIAGNOSIS — O905 Postpartum thyroiditis: Secondary | ICD-10-CM | POA: Diagnosis not present

## 2023-03-29 DIAGNOSIS — O905 Postpartum thyroiditis: Secondary | ICD-10-CM | POA: Diagnosis not present

## 2023-04-03 DIAGNOSIS — R1032 Left lower quadrant pain: Secondary | ICD-10-CM | POA: Diagnosis not present

## 2023-04-03 DIAGNOSIS — R102 Pelvic and perineal pain: Secondary | ICD-10-CM | POA: Diagnosis not present

## 2023-04-04 ENCOUNTER — Ambulatory Visit (INDEPENDENT_AMBULATORY_CARE_PROVIDER_SITE_OTHER): Payer: 59 | Admitting: Nurse Practitioner

## 2023-04-04 ENCOUNTER — Encounter: Payer: Self-pay | Admitting: Nurse Practitioner

## 2023-04-04 VITALS — BP 112/62 | HR 89

## 2023-04-04 DIAGNOSIS — R1084 Generalized abdominal pain: Secondary | ICD-10-CM

## 2023-04-04 NOTE — Progress Notes (Signed)
   Acute Office Visit  Subjective:    Patient ID: Jennifer Nelson, female    DOB: 31-Oct-1995, 27 y.o.   MRN: [id number]   HPI 27 y.o. [id number] presents today for UC follow up. Seen at [facility] yesterday for dysuria and LLQ abdominal pain x 2 days. Pain was on left and radiated to back initially, but that pain is now better but more in the mid abdomen now. Describes pain as intermittent, sharp, lasts ~ 2 minutes, happened twice yesterday while driving. But now pain is more achy/sore, feels abdominal pressure. 9/9 US unremarkable, other than polycystic ovaries. Negative UA and UPT. Denies urinary, vaginal or GI symptoms. Nausea and headache 9/8. Denies trauma to area.   Patient's last menstrual period was 03/13/2023 (exact date).    Review of Systems  Constitutional: Negative.   Gastrointestinal:  Positive for abdominal pain (Mid abdomen, radiates to back at times). Negative for abdominal distention, constipation, diarrhea and nausea.  Genitourinary: Negative.        Objective:    Physical Exam Constitutional:      Appearance: Normal appearance.  Abdominal:     Tenderness: There is no guarding or rebound.     BP 112/62   Pulse 89   LMP 03/13/2023 (Exact Date)   SpO2 98%  Wt Readings from Last 3 Encounters:  12/15/22 185 lb 12.8 oz (84.3 kg)  12/13/22 187 lb (84.8 kg)  11/23/22 187 lb (84.8 kg)        Assessment & Plan:   Problem List Items Addressed This Visit   None Visit Diagnoses     Generalized abdominal pain    -  Primary      Plan: Reviewed labs and imaging from UC visit. Does not appear to be GYN related, likely GI or musculoskeletal. Will follow up with PCP.      Olivia Mackie DNP, 12:18 PM 04/04/2023

## 2023-04-05 ENCOUNTER — Ambulatory Visit: Payer: 59 | Admitting: Physician Assistant

## 2023-04-20 DIAGNOSIS — J029 Acute pharyngitis, unspecified: Secondary | ICD-10-CM | POA: Diagnosis not present

## 2023-04-20 DIAGNOSIS — R110 Nausea: Secondary | ICD-10-CM | POA: Diagnosis not present

## 2023-04-20 DIAGNOSIS — R059 Cough, unspecified: Secondary | ICD-10-CM | POA: Diagnosis not present

## 2023-04-20 DIAGNOSIS — Z20822 Contact with and (suspected) exposure to covid-19: Secondary | ICD-10-CM | POA: Diagnosis not present

## 2023-04-27 DIAGNOSIS — O905 Postpartum thyroiditis: Secondary | ICD-10-CM | POA: Diagnosis not present

## 2023-05-24 DIAGNOSIS — Z8349 Family history of other endocrine, nutritional and metabolic diseases: Secondary | ICD-10-CM | POA: Diagnosis not present

## 2023-05-24 DIAGNOSIS — O905 Postpartum thyroiditis: Secondary | ICD-10-CM | POA: Diagnosis not present

## 2023-06-27 ENCOUNTER — Ambulatory Visit
Admission: EM | Admit: 2023-06-27 | Discharge: 2023-06-27 | Disposition: A | Discharge status: Home / Self Care | Payer: 59

## 2023-06-27 DIAGNOSIS — J069 Acute upper respiratory infection, unspecified: Secondary | ICD-10-CM | POA: Diagnosis not present

## 2023-06-27 MED ORDER — AMOXICILLIN-POT CLAVULANATE 875-125 MG PO TABS: 1.0000 | ORAL_TABLET | Freq: Two times a day (BID) | ORAL | 0 refills | Status: DC

## 2023-06-27 NOTE — ED Triage Notes (Signed)
Patient to Urgent Care with complaints of sore throat/ cough/ headaches/ ear pain/ body aches/ fevers/ chest congestion.  Reports symptoms started four days ago. Reports woke up today feeling worse.   Has been taking ibuprofen.

## 2023-06-27 NOTE — Discharge Instructions (Addendum)
Your symptoms today are most likely being caused by a virus and should steadily improve in time it can take up to 7 to 10 days before you truly start to see a turnaround however things will get better, if no improvement seen by Sunday you may begin use of Augmentin for bacterial coverage  COVID flu and strep testing negative    You can take Tylenol and/or Ibuprofen as needed for fever reduction and pain relief.   For cough: honey 1/2 to 1 teaspoon (you can dilute the honey in water or another fluid).  You can also use guaifenesin and dextromethorphan for cough. You can use a humidifier for chest congestion and cough.  If you don't have a humidifier, you can sit in the bathroom with the hot shower running.      For sore throat: try warm salt water gargles, cepacol lozenges, throat spray, warm tea or water with lemon/honey, popsicles or ice, or OTC cold relief medicine for throat discomfort.   For congestion: take a daily anti-histamine like Zyrtec, Claritin, and a oral decongestant, such as pseudoephedrine.  You can also use Flonase 1-2 sprays in each nostril daily.   It is important to stay hydrated: drink plenty of fluids (water, gatorade/powerade/pedialyte, juices, or teas) to keep your throat moisturized and help further relieve irritation/discomfort.

## 2023-06-27 NOTE — ED Provider Notes (Signed)
Renaldo Fiddler    CSN: [id number] Arrival date & time: 06/27/23  1210      History   Chief Complaint Chief Complaint  Patient presents with   URI    HPI Jennifer Nelson is a 27 y.o. female.   Patient presents for evaluation of fever and 100.4, nasal congestion, productive cough, sore throat, bilateral ear fullness, intermittent generalized headaches and nausea without vomiting present for 4 days.  Symptoms greatly worsening this morning.  Known sick contact with exposure to strep.  Has taken ibuprofen.  Decreased appetite with tolerating food and liquids.  Past Medical History:  Diagnosis Date   Anxiety    Depression    GERD (gastroesophageal reflux disease)    Headache    Vaginal delivery    2020, 2023   Vaginal Pap smear, abnormal     Patient Active Problem List   Diagnosis Date Noted   Incomplete right bundle branch block 12/13/2022   Postpartum thyroiditis 12/02/2021   Acute stress disorder 09/15/2021   History of LEEP (loop electrosurgical excision procedure) of cervix complicating pregnancy 01/28/2021   CIN III (cervical intraepithelial neoplasia grade III) with severe dysplasia 02/22/2019   Atypical glandular cells of undetermined significance (AGUS) on cervical Pap smear 11/07/2018    Past Surgical History:  Procedure Laterality Date   DENTAL SURGERY      OB History     Gravida  2   Para  2   Term  2   Preterm  0   AB  0   Living  2      SAB  0   IAB  0   Ectopic  0   Multiple  0   Live Births  2            Home Medications    Prior to Admission medications   Medication Sig Start Date End Date Taking? Authorizing Provider  amoxicillin-clavulanate (AUGMENTIN) 875-125 MG tablet Take 1 tablet by mouth every 12 (twelve) hours. 07/02/23  Yes Amrit Cress R, NP  IBUPROFEN PO Take by mouth.    [provider]  levothyroxine (SYNTHROID) 25 MCG tablet Take 25 mcg by mouth daily. Patient not taking:  Reported on 06/27/2023 03/27/23   [provider]    Family History Family History  Problem Relation Age of Onset   Depression Mother    Luiz Blare' disease Mother    Kidney cancer Mother    Arthritis Father    Alcohol abuse Father    Hypertension Father    Glaucoma Father    Skin cancer Father    Lung cancer Father        stage 3   Depression Sister    Mental illness Sister    Mental illness Sister    Drug abuse Sister    Depression Sister    Mental illness Brother    Depression Brother    Asthma Brother    Heart disease Maternal Grandmother    Early death Maternal Grandmother 68   Diabetes Maternal Grandmother    COPD Maternal Grandfather    Alcohol abuse Paternal Grandfather     Social History Social History   Tobacco Use   Smoking status: Never    Passive exposure: Never   Smokeless tobacco: Never  Vaping Use   Vaping status: Never Used  Substance Use Topics   Alcohol use: Yes    Comment: socially   Drug use: Never     Allergies   Esomeprazole magnesium,  Fluoxetine, and Sertraline   Review of Systems Review of Systems   Physical Exam Triage Vital Signs ED Triage Vitals  Encounter Vitals Group     BP 06/27/23 1244 106/74     Systolic BP Percentile --      Diastolic BP Percentile --      Pulse Rate 06/27/23 1244 87     Resp 06/27/23 1244 18     Temp 06/27/23 1244 98.5 F (36.9 C)     Temp src --      SpO2 06/27/23 1244 98 %     Weight --      Height --      Head Circumference --      Peak Flow --      Pain Score 06/27/23 1234 8     Pain Loc --      Pain Education --      Exclude from Growth Chart --    No data found.  Updated Vital Signs BP 106/74   Pulse 87   Temp 98.5 F (36.9 C)   Resp 18   LMP 06/09/2023   SpO2 98%   Visual Acuity Right Eye Distance:   Left Eye Distance:   Bilateral Distance:    Right Eye Near:   Left Eye Near:    Bilateral Near:     Physical Exam Constitutional:      Appearance: She is  ill-appearing.  HENT:     Head: Normocephalic.     Right Ear: Tympanic membrane, ear canal and external ear normal.     Left Ear: Tympanic membrane, ear canal and external ear normal.     Nose: Congestion present. No rhinorrhea.     Mouth/Throat:     Mouth: Mucous membranes are moist.     Pharynx: Oropharynx is clear.  Eyes:     Extraocular Movements: Extraocular movements intact.  Cardiovascular:     Rate and Rhythm: Normal rate and regular rhythm.     Pulses: Normal pulses.     Heart sounds: Normal heart sounds.  Pulmonary:     Effort: Pulmonary effort is normal.     Breath sounds: Normal breath sounds.  Musculoskeletal:     Cervical back: Normal range of motion and neck supple.  Skin:    General: Skin is warm and dry.  Neurological:     Mental Status: She is alert and oriented to person, place, and time. Mental status is at baseline.      UC Treatments / Results  Labs (all labs ordered are listed, but only abnormal results are displayed) Labs Reviewed  POC COVID19/FLU A&B COMBO  POCT RAPID STREP A (OFFICE)    EKG   Radiology No results found.  Procedures Procedures (including critical care time)  Medications Ordered in UC Medications - No data to display  Initial Impression / Assessment and Plan / UC Course  I have reviewed the triage vital signs and the nursing notes.  Pertinent labs & imaging results that were available during my care of the patient were reviewed by me and considered in my medical decision making (see chart for details).  Viral URI with cough  Patient is in no signs of distress nor toxic appearing.  Vital signs are stable.  Low suspicion for pneumonia, pneumothorax or bronchitis and therefore will defer imaging.  COVID flu and strep testing negative.  Wait antibiotic placed at pharmacy for day 10 if no improvement seen, Augmentin prescribed.May use additional over-the-counter medications as needed for supportive  care.  May follow-up with  urgent care as needed if symptoms persist or worsen.  Final Clinical Impressions(s) / UC Diagnoses   Final diagnoses:  Viral URI with cough     Discharge Instructions      Your symptoms today are most likely being caused by a virus and should steadily improve in time it can take up to 7 to 10 days before you truly start to see a turnaround however things will get better, if no improvement seen by Sunday you may begin use of Augmentin for bacterial coverage  COVID flu and strep testing negative    You can take Tylenol and/or Ibuprofen as needed for fever reduction and pain relief.   For cough: honey 1/2 to 1 teaspoon (you can dilute the honey in water or another fluid).  You can also use guaifenesin and dextromethorphan for cough. You can use a humidifier for chest congestion and cough.  If you don't have a humidifier, you can sit in the bathroom with the hot shower running.      For sore throat: try warm salt water gargles, cepacol lozenges, throat spray, warm tea or water with lemon/honey, popsicles or ice, or OTC cold relief medicine for throat discomfort.   For congestion: take a daily anti-histamine like Zyrtec, Claritin, and a oral decongestant, such as pseudoephedrine.  You can also use Flonase 1-2 sprays in each nostril daily.   It is important to stay hydrated: drink plenty of fluids (water, gatorade/powerade/pedialyte, juices, or teas) to keep your throat moisturized and help further relieve irritation/discomfort.    ED Prescriptions     Medication Sig Dispense Auth. Provider   amoxicillin-clavulanate (AUGMENTIN) 875-125 MG tablet Take 1 tablet by mouth every 12 (twelve) hours. 14 tablet Alaijah Gibler, Elita Boone, NP      PDMP not reviewed this encounter.   Valinda Hoar, NP 06/27/23 1325

## 2023-07-07 DIAGNOSIS — S99922A Unspecified injury of left foot, initial encounter: Secondary | ICD-10-CM | POA: Diagnosis not present

## 2023-07-07 DIAGNOSIS — M79672 Pain in left foot: Secondary | ICD-10-CM | POA: Diagnosis not present

## 2023-07-07 DIAGNOSIS — W450XXA Nail entering through skin, initial encounter: Secondary | ICD-10-CM | POA: Diagnosis not present

## 2023-07-07 DIAGNOSIS — T1490XA Injury, unspecified, initial encounter: Secondary | ICD-10-CM | POA: Diagnosis not present

## 2023-08-04 ENCOUNTER — Other Ambulatory Visit: Payer: Self-pay

## 2023-08-04 ENCOUNTER — Other Ambulatory Visit: Payer: Self-pay | Admitting: Physician Assistant

## 2023-08-04 VITALS — Temp 98.7°F

## 2023-08-04 DIAGNOSIS — R350 Frequency of micturition: Secondary | ICD-10-CM

## 2023-08-04 LAB — POCT URINALYSIS DIPSTICK
Bilirubin, UA: NEGATIVE
Blood, UA: NEGATIVE
Glucose, UA: NEGATIVE
Ketones, UA: NEGATIVE
Leukocytes, UA: NEGATIVE
Nitrite, UA: NEGATIVE
Protein, UA: NEGATIVE
Spec Grav, UA: 1.02 (ref 1.010–1.025)
Urobilinogen, UA: 0.2 U/dL
pH, UA: 6 (ref 5.0–8.0)

## 2023-08-04 MED ORDER — PHENAZOPYRIDINE HCL 200 MG PO TABS: 200.0000 mg | ORAL_TABLET | Freq: Three times a day (TID) | ORAL | 0 refills | Status: DC | PRN

## 2023-08-04 NOTE — Progress Notes (Signed)
 S/Sx started Monday: States urine was cloudier earlier this week - today it's clear Increased frequency voiding but feels like bladder isn't emptying completely Burning with urination Pelvis soreness - feels like cramps Urinary leaking Denies fever & back pain  Traveled to [place] last week for vacation & was in pools and hot tubs  Advised to get some AZO tablets and increase po fluids  Urine specimen sent to [facility] for Culture & Sensitivity  Will send message to Renay Sharps, PA-C & advise Quintara afterwards what he plans

## 2023-08-07 LAB — URINE CULTURE

## 2023-10-03 ENCOUNTER — Ambulatory Visit: Payer: Self-pay

## 2023-10-03 DIAGNOSIS — Z0000 Encounter for general adult medical examination without abnormal findings: Secondary | ICD-10-CM

## 2023-10-03 LAB — POCT URINALYSIS DIPSTICK
Bilirubin, UA: NEGATIVE
Blood, UA: NEGATIVE
Glucose, UA: NEGATIVE
Ketones, UA: NEGATIVE
Leukocytes, UA: NEGATIVE
Nitrite, UA: NEGATIVE
Protein, UA: POSITIVE — AB
Spec Grav, UA: 1.025 (ref 1.010–1.025)
Urobilinogen, UA: 0.2 U/dL
pH, UA: 6 (ref 5.0–8.0)

## 2023-10-04 LAB — CMP12+LP+TP+TSH+6AC+CBC/D/PLT
ALT: 11 IU/L (ref 0–32)
AST: 18 IU/L (ref 0–40)
Albumin: 4.5 g/dL (ref 4.0–5.0)
Alkaline Phosphatase: 66 IU/L (ref 44–121)
BUN/Creatinine Ratio: 10 (ref 9–23)
BUN: 9 mg/dL (ref 6–20)
Basophils Absolute: 0 10*3/uL (ref 0.0–0.2)
Basos: 1 %
Bilirubin Total: 0.3 mg/dL (ref 0.0–1.2)
Calcium: 9 mg/dL (ref 8.7–10.2)
Chloride: 103 mmol/L (ref 96–106)
Chol/HDL Ratio: 3.5 ratio (ref 0.0–4.4)
Cholesterol, Total: 200 mg/dL — ABNORMAL HIGH (ref 100–199)
Creatinine, Ser: 0.86 mg/dL (ref 0.57–1.00)
EOS (ABSOLUTE): 0.4 10*3/uL (ref 0.0–0.4)
Eos: 7 %
Estimated CHD Risk: 0.6 times avg. (ref 0.0–1.0)
Free Thyroxine Index: 1.8 (ref 1.2–4.9)
GGT: 11 IU/L (ref 0–60)
Globulin, Total: 2.8 g/dL (ref 1.5–4.5)
Glucose: 83 mg/dL (ref 70–99)
HDL: 57 mg/dL (ref >39)
Hematocrit: 40.6 % (ref 34.0–46.6)
Hemoglobin: 13.6 g/dL (ref 11.1–15.9)
Immature Grans (Abs): 0 10*3/uL (ref 0.0–0.1)
Immature Granulocytes: 0 %
Iron: 50 ug/dL (ref 27–159)
LDH: 156 IU/L (ref 119–226)
LDL Chol Calc (NIH): 129 mg/dL — ABNORMAL HIGH (ref 0–99)
Lymphocytes Absolute: 2 10*3/uL (ref 0.7–3.1)
Lymphs: 39 %
MCH: 31.3 pg (ref 26.6–33.0)
MCHC: 33.5 g/dL (ref 31.5–35.7)
MCV: 93 fL (ref 79–97)
Monocytes Absolute: 0.3 10*3/uL (ref 0.1–0.9)
Monocytes: 6 %
Neutrophils Absolute: 2.4 10*3/uL (ref 1.4–7.0)
Neutrophils: 47 %
Phosphorus: 3.5 mg/dL (ref 3.0–4.3)
Platelets: 310 10*3/uL (ref 150–450)
Potassium: 4.2 mmol/L (ref 3.5–5.2)
RBC: 4.35 x10E6/uL (ref 3.77–5.28)
RDW: 11.8 % (ref 11.7–15.4)
Sodium: 138 mmol/L (ref 134–144)
T3 Uptake Ratio: 26 % (ref 24–39)
T4, Total: 7 ug/dL (ref 4.5–12.0)
TSH: 2.88 u[IU]/mL (ref 0.450–4.500)
Total Protein: 7.3 g/dL (ref 6.0–8.5)
Triglycerides: 80 mg/dL (ref 0–149)
Uric Acid: 3.9 mg/dL (ref 2.6–6.2)
VLDL Cholesterol Cal: 14 mg/dL (ref 5–40)
WBC: 5 10*3/uL (ref 3.4–10.8)
eGFR: 93 mL/min/{1.73_m2} (ref >59)

## 2023-10-10 ENCOUNTER — Ambulatory Visit: Payer: Self-pay | Admitting: Physician Assistant

## 2023-10-10 ENCOUNTER — Encounter: Payer: Self-pay | Admitting: Physician Assistant

## 2023-10-10 VITALS — BP 112/75 | HR 71 | Temp 98.4°F | Resp 14 | Wt 170.0 lb

## 2023-10-10 DIAGNOSIS — Z0000 Encounter for general adult medical examination without abnormal findings: Secondary | ICD-10-CM

## 2023-10-10 NOTE — Progress Notes (Signed)
 Here for yearly physical with provider.  No complaints voiced.

## 2023-10-10 NOTE — Progress Notes (Signed)
 [facility] ____________________________________________   None    (approximate)  I have reviewed the triage vital signs and the nursing notes.   HISTORY  Chief Complaint Annual Exam   HPI Jennifer Nelson is a 28 y.o. female patient presents for annual [employer] physical exam.  Patient voices no concerns or complaints.         Past Medical History:  Diagnosis Date   Anxiety    Depression    GERD (gastroesophageal reflux disease)    Headache    Vaginal delivery    2020, 2023   Vaginal Pap smear, abnormal     Patient Active Problem List   Diagnosis Date Noted   Incomplete right bundle branch block 12/13/2022   Postpartum thyroiditis 12/02/2021   Acute stress disorder 09/15/2021   History of LEEP (loop electrosurgical excision procedure) of cervix complicating pregnancy 01/28/2021   CIN III (cervical intraepithelial neoplasia grade III) with severe dysplasia 02/22/2019   Atypical glandular cells of undetermined significance (AGUS) on cervical Pap smear 11/07/2018    Past Surgical History:  Procedure Laterality Date   DENTAL SURGERY      Prior to Admission medications   Not on File    Allergies Esomeprazole magnesium, Fluoxetine, and Sertraline  Family History  Problem Relation Age of Onset   Depression Mother    Luiz Blare' disease Mother    Kidney cancer Mother    Arthritis Father    Alcohol abuse Father    Hypertension Father    Glaucoma Father    Skin cancer Father    Lung cancer Father        stage 3   Depression Sister    Mental illness Sister    Mental illness Sister    Drug abuse Sister    Depression Sister    Mental illness Brother    Depression Brother    Asthma Brother    Heart disease Maternal Grandmother    Early death Maternal Grandmother 30   Diabetes Maternal Grandmother    COPD Maternal Grandfather    Alcohol abuse Paternal Grandfather     Social History Social History    Tobacco Use   Smoking status: Never    Passive exposure: Never   Smokeless tobacco: Never  Vaping Use   Vaping status: Never Used  Substance Use Topics   Alcohol use: Yes    Comment: socially   Drug use: Never    Review of Systems Constitutional: No fever/chills Eyes: No visual changes. ENT: No sore throat. Cardiovascular: Denies chest pain. Respiratory: Denies shortness of breath. Gastrointestinal: No abdominal pain.  No nausea, no vomiting.  No diarrhea.  No constipation. Genitourinary: Negative for dysuria. Musculoskeletal: Negative for back pain. Skin: Negative for rash. Neurological: Negative for headaches, focal weakness or numbness. Allergic/Immunilogical:Esomeprazole, Fluoxetine, and Sertraline ____________________________________________   PHYSICAL EXAM:  VITAL SIGNS: BP 112/75  Pulse Rate 71  Temp 98.4 F (36.9 C)  Weight 170 lb (77.1 kg)  Resp 14  SpO2 100 %   BMI: 26.63 kg/m2  BSA: 1.91 m2   Constitutional: Alert and oriented. Well appearing and in no acute distress. Eyes: Conjunctivae are normal. PERRL. EOMI. Head: Atraumatic. Nose: No congestion/rhinnorhea. Mouth/Throat: Mucous membranes are moist.  Oropharynx non-erythematous. Neck: No stridor.  No cervical spine tenderness to palpation. Hematological/Lymphatic/Immunilogical: No cervical lymphadenopathy. Cardiovascular: Normal rate, regular rhythm. Grossly normal heart sounds.  Good peripheral circulation. Respiratory: Normal respiratory effort.  No retractions. Lungs CTAB. Gastrointestinal: Soft and nontender. No  distention. No abdominal bruits. No CVA tenderness. Genitourinary: Deferred Musculoskeletal: No lower extremity tenderness nor edema.  No joint effusions. Neurologic:  Normal speech and language. No gross focal neurologic deficits are appreciated. No gait instability. Skin:  Skin is warm, dry and intact. No rash noted. Psychiatric: Mood and affect are normal. Speech and behavior are  normal.  ____________________________________________   LABS           Component Ref Range & Units (hover) 7 d ago (10/03/23) 2 mo ago (08/04/23) 10 mo ago (11/22/22) 2 yr ago (08/06/21) 2 yr ago (07/29/21) 2 yr ago (07/22/21) 2 yr ago (07/07/21)  Color, UA Dark Yellow Yellow yellow      Clarity, UA Clear Clear clear      Glucose, UA Negative Negative Negative      Bilirubin, UA Negative Negative neg      Ketones, UA Negative Negative trace -+      Spec Grav, UA 1.025 1.020 1.015 R R R R  Blood, UA Negative Negative neg      pH, UA 6.0 6.0 6.0 R R R R  Protein, UA Positive Abnormal  Negative Negative      Comment: 1+  Urobilinogen, UA 0.2 0.2 0.2 R R R R  Nitrite, UA Negative Negative neg      Leukocytes, UA Negative Negative Negative R R R R  Appearance         Odor                    View All Conversations on this Encounter                Component Ref Range & Units (hover) 7 d ago (10/03/23) 10 mo ago (11/22/22) 1 yr ago (04/01/22) 1 yr ago (01/27/22) 1 yr ago (11/26/21) 1 yr ago (11/26/21) 1 yr ago (11/26/21) 1 yr ago (11/26/21)  Glucose 83 155 High         Uric Acid 3.9 4.3 CM        Comment:            Therapeutic target for gout patients: <6.0  BUN 9 11        Creatinine, Ser 0.86 0.75        eGFR 93 110        BUN/Creatinine Ratio 10 15        Sodium 138 139        Potassium 4.2 3.7        Chloride 103 102        Calcium 9.0 9.4        Phosphorus 3.5 3.4        Total Protein 7.3 7.3        Albumin 4.5 4.6    4.2 R    Globulin, Total 2.8 2.7        Bilirubin Total 0.3 0.3        Alkaline Phosphatase 66 50   54 R     LDH 156 139        AST 18 21   19  R     ALT 11 15   14  R     GGT 11 10        Iron 50 110        Cholesterol, Total 200 High  194        Triglycerides 80 87        HDL 57 56  VLDL Cholesterol Cal 14 16        LDL Chol Calc (NIH) 129 High  122 High         Chol/HDL Ratio 3.5 3.5 CM        Comment:                                   T.  Chol/HDL Ratio                                             Men  Women                               1/2 Avg.Risk  3.4    3.3                                   Avg.Risk  5.0    4.4                                2X Avg.Risk  9.6    7.1                                3X Avg.Risk 23.4   11.0  Estimated CHD Risk 0.6 0.6 CM        Comment: The CHD Risk is based on the T. Chol/HDL ratio. Other factors affect CHD Risk such as hypertension, smoking, diabetes, severe obesity, and family history of premature CHD.  TSH 2.880 1.880 2.50 R 2.66 R      T4, Total 7.0 6.0        T3 Uptake Ratio 26 26        Free Thyroxine Index 1.8 1.6        WBC 5.0 5.2      5.5 R  RBC 4.35 4.75     4.55 R   Hemoglobin 13.6 14.3      12.4 R  Hematocrit 40.6 41.9      37 R  MCV 93 88        MCH 31.3 30.1        MCHC 33.5 34.1        RDW 11.8 12.5        Platelets 310 321      332 R  Neutrophils 47 60        Lymphs 39 33        Monocytes 6 4        Eos 7 2        Basos 1 1        Neutrophils Absolute 2.4 3.1        Lymphocytes Absolute 2.0 1.7        Monocytes Absolute 0.3 0.2        EOS (ABSOLUTE) 0.4 0.1        Basophils Absolute 0.0 0.0        Immature Granulocytes 0 0        Immature Grans (Abs) 0.0 0.0  ____________________________________________  EKG  Sinus rhythm at 68 bpm.  Incomplete right bundle branch block.  Asymptomatic. ____________________________________________    ____________________________________________   INITIAL IMPRESSION / ASSESSMENT AND PLAN   As part of my medical decision making, I reviewed the following data within the electronic MEDICAL RECORD NUMBER      No acute findings on physical exam, EKG, or labs.        ____________________________________________   FINAL CLINICAL IMPRESSION Well exam   ED Discharge Orders     None        Note:  This document was prepared using Dragon voice recognition software and may include  unintentional dictation errors.

## 2023-11-02 ENCOUNTER — Ambulatory Visit: Admitting: Family

## 2023-11-02 ENCOUNTER — Telehealth: Payer: Self-pay

## 2023-11-02 VITALS — BP 111/77 | HR 71 | Temp 97.9°F | Ht 67.0 in | Wt 170.2 lb

## 2023-11-02 DIAGNOSIS — R519 Headache, unspecified: Secondary | ICD-10-CM | POA: Diagnosis not present

## 2023-11-02 DIAGNOSIS — R42 Dizziness and giddiness: Secondary | ICD-10-CM | POA: Diagnosis not present

## 2023-11-02 DIAGNOSIS — R110 Nausea: Secondary | ICD-10-CM | POA: Diagnosis not present

## 2023-11-02 MED ORDER — ONDANSETRON 4 MG PO TBDP: 4.0000 mg | ORAL_TABLET | Freq: Three times a day (TID) | ORAL | 0 refills | Status: DC | PRN

## 2023-11-02 MED ORDER — KETOROLAC TROMETHAMINE 60 MG/2ML IM SOLN
60.0000 mg | Freq: Once | INTRAMUSCULAR | Status: AC
  Administered 2023-11-02: 60 mg via INTRAMUSCULAR

## 2023-11-02 MED ORDER — MECLIZINE HCL 12.5 MG PO TABS: 12.5000 mg | ORAL_TABLET | Freq: Three times a day (TID) | ORAL | 0 refills | Status: DC | PRN

## 2023-11-02 NOTE — Progress Notes (Signed)
 Patient ID: Jennifer Nelson, female    DOB: [birth year], 28 y.o.   MRN: [id number]  Chief Complaint  Patient presents with   Headache    Pt c/o headache, nausea , dizziness and unable to see far away. Has tried Excedrin and advil which did not help sx.   Discussed the use of AI scribe software for clinical note transcription with the patient, who gave verbal consent to proceed.  History of Present Illness The patient, with a history of concussion and vertigo about 10years ago, presents with a severe headache that started yesterday morning and has progressively worsened. The pain is located behind the eyes and extends to the forehead. The headache is associated with blurry vision, difficulty focusing on distant objects, and a sensation of spinning when the eyes are closed. The patient also reports feeling nauseous, especially when changing the direction of her gaze or after eating or drinking. The patient denies recent illness, allergies, or increased stress. The patient has tried Excedrin Migraine without relief. She reports her last eye exam was a year ago, does not wear glasses or contacts.  Assessment & Plan Headache with associated vertigo and nausea - Acute headache with vertigo and nausea, first occurrence. Pain periocular with severe episodes and vision changes. Differential includes ocular migraine, sinus-related headache, atypical vertigo. Toradol proposed for pain relief. Imaging if symptoms persist. Flonase or Nasacort if sinus-related. Meclizine if vertigo persists. Zofran for nausea. - Administer Toradol 60mg  injection for pain relief. - Schedule appointment with Optometrist or Ophthalmologist for thorough eye exam. - Advise hydration with at least two liters of water daily, avoid skipping meals. - Suggest generic Flonase or Nasacort OTC nasal spray if symptoms persist, 1 spray each nostril, twice a day for 3 days, then use daily for up to 1-2 weeks, then as needed. - Advise  to take Aleve (naproxen) starting tomorrow after the Toradol injection today, two tablets in the morning and two at night for a 3 days if pain persistent. - Prescribing Zofran 4mg  3x/d for nausea as needed - Prescribing meclizine 12.5mg  3x/d as needed if vertigo symptoms persist. - Advise to report persistent symptoms, in which case imaging may be considered.    Subjective:    No outpatient medications prior to visit.   No facility-administered medications prior to visit.   Past Medical History:  Diagnosis Date   Anxiety    Depression    GERD (gastroesophageal reflux disease)    Headache    Vaginal delivery    2020, 2023   Vaginal Pap smear, abnormal    Past Surgical History:  Procedure Laterality Date   DENTAL SURGERY     Allergies  Allergen Reactions   Esomeprazole Magnesium Nausea And Vomiting   Fluoxetine Hives   Sertraline Hives      Objective:    Physical Exam Vitals and nursing note reviewed.  Constitutional:      Appearance: Normal appearance.  Eyes:     Extraocular Movements: Extraocular movements intact.     Right eye: No nystagmus.     Left eye: No nystagmus.     Conjunctiva/sclera: Conjunctivae normal.  Cardiovascular:     Rate and Rhythm: Normal rate and regular rhythm.  Pulmonary:     Effort: Pulmonary effort is normal.     Breath sounds: Normal breath sounds.  Musculoskeletal:        General: Normal range of motion.  Skin:    General: Skin is warm and dry.  Neurological:  Mental Status: She is alert.  Psychiatric:        Mood and Affect: Mood normal.        Behavior: Behavior normal.    BP 111/77 (BP Location: Left Arm, Patient Position: Sitting, Cuff Size: Large)   Pulse 71   Temp 97.9 F (36.6 C) (Temporal)   Ht 5\' 7"  (1.702 m)   Wt 170 lb 4 oz (77.2 kg)   LMP 10/15/2023 (Exact Date)   SpO2 99%   BMI 26.66 kg/m  Wt Readings from Last 3 Encounters:  11/02/23 170 lb 4 oz (77.2 kg)  10/10/23 170 lb (77.1 kg)  12/15/22 185 lb  12.8 oz (84.3 kg)      Dulce Sellar, NP

## 2023-11-02 NOTE — Telephone Encounter (Signed)
 Pt called requesting advice or apt for migraine 2 x days. Pt states she  doesn't have a history of headaches or migraines. Last one she had was when she was pregnant. Currently she's nauseous and having blurred vision. Per Cay Schillings she was advised to go to E.R for we do not supply strong enough medication for her migraine and symptoms of blurred vision is at a concern.

## 2023-11-03 ENCOUNTER — Encounter: Payer: Self-pay | Admitting: Family

## 2023-11-03 DIAGNOSIS — G43809 Other migraine, not intractable, without status migrainosus: Secondary | ICD-10-CM | POA: Diagnosis not present

## 2023-11-03 DIAGNOSIS — Z888 Allergy status to other drugs, medicaments and biological substances status: Secondary | ICD-10-CM | POA: Diagnosis not present

## 2023-11-03 DIAGNOSIS — R519 Headache, unspecified: Secondary | ICD-10-CM | POA: Diagnosis not present

## 2023-11-03 DIAGNOSIS — K219 Gastro-esophageal reflux disease without esophagitis: Secondary | ICD-10-CM | POA: Diagnosis not present

## 2023-11-03 DIAGNOSIS — R42 Dizziness and giddiness: Secondary | ICD-10-CM | POA: Diagnosis not present

## 2023-11-03 NOTE — Telephone Encounter (Signed)
 She may need to go to the ER if head pain and dizziness are still present. I can send in a muscle relaxer to help the head pain but if she tried the steroid nasal spray, the meclizine and toradol shot did not help then may need ER & request head CT- let me know

## 2024-03-07 DIAGNOSIS — F4323 Adjustment disorder with mixed anxiety and depressed mood: Secondary | ICD-10-CM | POA: Diagnosis not present

## 2024-03-12 DIAGNOSIS — F4323 Adjustment disorder with mixed anxiety and depressed mood: Secondary | ICD-10-CM | POA: Diagnosis not present

## 2024-03-26 DIAGNOSIS — F4323 Adjustment disorder with mixed anxiety and depressed mood: Secondary | ICD-10-CM | POA: Diagnosis not present

## 2024-04-08 DIAGNOSIS — F4323 Adjustment disorder with mixed anxiety and depressed mood: Secondary | ICD-10-CM | POA: Diagnosis not present

## 2024-04-17 ENCOUNTER — Ambulatory Visit: Admitting: Nurse Practitioner

## 2024-04-22 ENCOUNTER — Other Ambulatory Visit (HOSPITAL_COMMUNITY)
Admission: RE | Admit: 2024-04-22 | Discharge: 2024-04-22 | Disposition: A | Discharge status: Home / Self Care | Source: Ambulatory Visit | Attending: Nurse Practitioner | Admitting: Nurse Practitioner

## 2024-04-22 ENCOUNTER — Ambulatory Visit: Admitting: Nurse Practitioner

## 2024-04-22 ENCOUNTER — Encounter: Payer: Self-pay | Admitting: Nurse Practitioner

## 2024-04-22 VITALS — BP 108/64 | HR 80 | Ht 65.75 in | Wt 172.0 lb

## 2024-04-22 DIAGNOSIS — F4323 Adjustment disorder with mixed anxiety and depressed mood: Secondary | ICD-10-CM | POA: Diagnosis not present

## 2024-04-22 DIAGNOSIS — Z01419 Encounter for gynecological examination (general) (routine) without abnormal findings: Secondary | ICD-10-CM | POA: Insufficient documentation

## 2024-04-22 DIAGNOSIS — Z124 Encounter for screening for malignant neoplasm of cervix: Secondary | ICD-10-CM

## 2024-04-22 DIAGNOSIS — Z1331 Encounter for screening for depression: Secondary | ICD-10-CM | POA: Diagnosis not present

## 2024-04-22 NOTE — Progress Notes (Signed)
   Jennifer Nelson March 02, 1996 [id number]   History:  28 y.o. [id number] presents for annual exam. Monthly cycles. 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 Thyroid  disease managed by PCP, normal levels.   Gynecologic History Patient's last menstrual period was 03/25/2024 (exact date). Period Duration (Days): 3-5 Period Pattern: Regular Menstrual Flow: Moderate Menstrual Control: Other (Comment) (menstrual disc) Dysmenorrhea: (!) Moderate Dysmenorrhea Symptoms: Cramping Contraception/Family planning: vasectomy Sexually active: Yes  Health Maintenance Last Pap: 09/27/2021. Results were: Normal Last mammogram: Not indicated Last colonoscopy: Not indicated Last Dexa: Not indicated     04/22/2024    2:58 PM  Depression screen PHQ 2/9  Decreased Interest 0  Down, Depressed, Hopeless 0  PHQ - 2 Score 0     Past medical history, past surgical history, family history and social history were all reviewed and documented in the EPIC chart. 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 30 yo son, 2 yo daughter. Moving to [facility] next month.   ROS:  A ROS was performed and pertinent positives and negatives are included.  Exam:  Vitals:   04/22/24 1447  BP: 108/64  Pulse: 80  SpO2: 99%  Weight: 172 lb (78 kg)  Height: 5' 5.75 (1.67 m)    Body mass index is 27.97 kg/m.  General appearance:  Normal Thyroid :  Symmetrical, normal in size, without palpable masses or nodularity. Respiratory  Auscultation:  Clear without wheezing or rhonchi Cardiovascular  Auscultation:  Regular rate, without rubs, murmurs or gallops  Edema/varicosities:  Not grossly evident Abdominal  Soft,nontender, without masses, guarding or rebound.  Liver/spleen:  No organomegaly noted  Hernia:  None appreciated  Skin  Inspection:  Grossly normal Breasts: Examined lying and sitting.   Right: Without masses, retractions, nipple discharge or axillary adenopathy.   Left: Without masses, retractions, nipple discharge or axillary  adenopathy. Pelvic: External genitalia:  no lesions              Urethra:  normal appearing urethra with no masses, tenderness or lesions              Bartholins and Skenes: normal                 Vagina: normal appearing vagina with normal color and discharge, no lesions              Cervix: no lesions Bimanual Exam:  Uterus:  no masses or tenderness              Adnexa: no mass, fullness, tenderness              Rectovaginal: Deferred              Anus:  normal, no lesions  Dereck Keas, CMA present as chaperone.   Assessment/Plan:  28 y.o. [id number] for annual exam.   Well female exam with routine gynecological exam - Education provided on SBEs, importance of preventative screenings, current guidelines, high calcium diet, regular exercise, and multivitamin daily.  Labs with PCP.   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 Pap today.   Return in about 1 year (around 04/22/2025) for Annual.     Jennifer DELENA Shutter DNP, 3:15 PM 04/22/2024

## 2024-04-23 LAB — CYTOLOGY - PAP
Comment: NEGATIVE
Diagnosis: NEGATIVE
High risk HPV: NEGATIVE

## 2024-04-24 ENCOUNTER — Ambulatory Visit: Payer: Self-pay | Admitting: Nurse Practitioner

## 2024-04-24 ENCOUNTER — Other Ambulatory Visit: Payer: Self-pay | Admitting: Nurse Practitioner

## 2024-04-24 DIAGNOSIS — B9689 Other specified bacterial agents as the cause of diseases classified elsewhere: Secondary | ICD-10-CM

## 2024-04-24 MED ORDER — METRONIDAZOLE 500 MG PO TABS: 500.0000 mg | ORAL_TABLET | Freq: Two times a day (BID) | ORAL | 0 refills | Status: DC

## 2024-05-06 DIAGNOSIS — F4323 Adjustment disorder with mixed anxiety and depressed mood: Secondary | ICD-10-CM | POA: Diagnosis not present

## 2024-05-07 NOTE — Telephone Encounter (Signed)
 Needs OV. Was asymptomatic at time of visit (BV found on pap), so these are all new symptoms.

## 2024-05-07 NOTE — Telephone Encounter (Signed)
 Spoke with patient, OV scheduled for 10/16 at 1345 with Tiffany. Patient agreeable to date and time.   Routing to provider for final review. Patient is agreeable to disposition. Will close encounter.

## 2024-05-07 NOTE — Telephone Encounter (Signed)
Tiffany please review

## 2024-05-09 ENCOUNTER — Encounter: Payer: Self-pay | Admitting: Nurse Practitioner

## 2024-05-09 ENCOUNTER — Ambulatory Visit (INDEPENDENT_AMBULATORY_CARE_PROVIDER_SITE_OTHER): Admitting: Nurse Practitioner

## 2024-05-09 VITALS — BP 104/68 | HR 68 | Temp 98.2°F

## 2024-05-09 DIAGNOSIS — R309 Painful micturition, unspecified: Secondary | ICD-10-CM

## 2024-05-09 DIAGNOSIS — N760 Acute vaginitis: Secondary | ICD-10-CM

## 2024-05-09 LAB — URINALYSIS, COMPLETE W/RFL CULTURE
Bacteria, UA: NONE SEEN /HPF
Bilirubin Urine: NEGATIVE
Glucose, UA: NEGATIVE
Hgb urine dipstick: NEGATIVE
Hyaline Cast: NONE SEEN /LPF
Ketones, ur: NEGATIVE
Leukocyte Esterase: NEGATIVE
Nitrites, Initial: NEGATIVE
Protein, ur: NEGATIVE
RBC / HPF: NONE SEEN /HPF (ref 0–2)
Specific Gravity, Urine: 1.02 (ref 1.001–1.035)
WBC, UA: NONE SEEN /HPF (ref 0–5)
pH: 5.5 (ref 5.0–8.0)

## 2024-05-09 LAB — WET PREP FOR TRICH, YEAST, CLUE

## 2024-05-09 LAB — NO CULTURE INDICATED

## 2024-05-09 MED ORDER — FLUCONAZOLE 150 MG PO TABS: 150.0000 mg | ORAL_TABLET | ORAL | 0 refills | Status: AC

## 2024-05-09 NOTE — Progress Notes (Signed)
   Acute Office Visit  Subjective:    Patient ID: Jennifer Nelson, female    DOB: 1995/12/21, 28 y.o.   MRN: [id number]   HPI 28 y.o. presents today for vaginal irritation, creamy white discharge with odor. Also having burning with urination, nausea and back pain. Treated for BV 10/1- asymptomatic at that time and found on pap. Symptoms started after she began the antibiotics.   Patient's last menstrual period was 04/24/2024 (exact date). Period Duration (Days): 3-4 Period Pattern: Regular Menstrual Flow: Moderate Menstrual Control:  (disc) Dysmenorrhea: (!) Moderate Dysmenorrhea Symptoms: Cramping, Nausea, Headache  Review of Systems  Constitutional:  Negative for chills and fever.  Genitourinary:  Positive for dysuria, flank pain, vaginal discharge and vaginal pain. Negative for frequency, hematuria, pelvic pain and urgency.       Objective:    Physical Exam Constitutional:      Appearance: Normal appearance.  Genitourinary:    General: Normal vulva.     Vagina: Vaginal discharge present. No erythema.     Cervix: Normal.     BP 104/68   Pulse 68   Temp 98.2 F (36.8 C) (Oral)   LMP 04/24/2024 (Exact Date)  Wt Readings from Last 3 Encounters:  04/22/24 172 lb (78 kg)  11/02/23 170 lb 4 oz (77.2 kg)  10/10/23 170 lb (77.1 kg)        Zada Louder, CMA present as chaperone.   Wet prep neg for pathogens  UA negative  Assessment & Plan:   Problem List Items Addressed This Visit   None Visit Diagnoses       Acute vaginitis    -  Primary   Relevant Medications   fluconazole (DIFLUCAN) 150 MG tablet   Other Relevant Orders   WET PREP FOR TRICH, YEAST, CLUE     Pain with urination       Relevant Orders   Urinalysis,Complete w/RFL Culture      Plan: Wet prep and UA negative. Diflucan 150 mg for possible yeast s/t antibiotic use. Apply hydrocortisone  cream BID x 7 days externally.   Return if symptoms worsen or fail to improve.    [name] DNP, 2:16 PM 05/09/2024

## 2024-05-22 DIAGNOSIS — F4323 Adjustment disorder with mixed anxiety and depressed mood: Secondary | ICD-10-CM | POA: Diagnosis not present
